# Patient Record
Sex: Male | Born: 1945
Health system: Southern US, Community
[De-identification: ages and names within clinical notes are randomized; demographics above are authoritative.]

## PROBLEM LIST (undated history)

## (undated) DIAGNOSIS — F0390 Unspecified dementia without behavioral disturbance: Secondary | ICD-10-CM

## (undated) DIAGNOSIS — E785 Hyperlipidemia, unspecified: Secondary | ICD-10-CM

## (undated) DIAGNOSIS — K5720 Diverticulitis of large intestine with perforation and abscess without bleeding: Secondary | ICD-10-CM

## (undated) HISTORY — DX: Diverticulitis of large intestine with perforation and abscess without bleeding: K57.20

## (undated) HISTORY — PX: CHOLECYSTECTOMY: SHX55

---

## 2008-07-24 ENCOUNTER — Inpatient Hospital Stay: Payer: Self-pay | Admitting: General Surgery

## 2010-07-10 ENCOUNTER — Emergency Department: Payer: Self-pay | Admitting: Emergency Medicine

## 2010-07-12 LAB — PSA

## 2011-02-01 ENCOUNTER — Ambulatory Visit: Payer: Self-pay | Admitting: Urology

## 2012-02-20 ENCOUNTER — Ambulatory Visit: Payer: Self-pay | Admitting: Urology

## 2013-05-06 ENCOUNTER — Emergency Department: Payer: Self-pay | Admitting: Emergency Medicine

## 2013-05-06 LAB — COMPREHENSIVE METABOLIC PANEL
Albumin: 4.1 g/dL (ref 3.4–5.0)
Alkaline Phosphatase: 38 U/L — ABNORMAL LOW (ref 50–136)
Anion Gap: 4 — ABNORMAL LOW (ref 7–16)
BUN: 13 mg/dL (ref 7–18)
Bilirubin,Total: 0.8 mg/dL (ref 0.2–1.0)
Calcium, Total: 9.6 mg/dL (ref 8.5–10.1)
Chloride: 105 mmol/L (ref 98–107)
Creatinine: 1.65 mg/dL — ABNORMAL HIGH (ref 0.60–1.30)
EGFR (African American): 49 — ABNORMAL LOW
Potassium: 4.3 mmol/L (ref 3.5–5.1)
SGOT(AST): 30 U/L (ref 15–37)
SGPT (ALT): 34 U/L (ref 12–78)
Total Protein: 7.8 g/dL (ref 6.4–8.2)

## 2013-05-06 LAB — CBC
HGB: 16.5 g/dL (ref 13.0–18.0)
MCH: 32.4 pg (ref 26.0–34.0)
MCHC: 35.4 g/dL (ref 32.0–36.0)
MCV: 92 fL (ref 80–100)
Platelet: 129 10*3/uL — ABNORMAL LOW (ref 150–440)
RDW: 13.2 % (ref 11.5–14.5)
WBC: 9.9 10*3/uL (ref 3.8–10.6)

## 2013-05-06 LAB — URINALYSIS, COMPLETE
Bilirubin,UR: NEGATIVE
Protein: NEGATIVE
RBC,UR: 1 /HPF (ref 0–5)
Specific Gravity: 1.017 (ref 1.003–1.030)
Squamous Epithelial: <1
WBC UR: 1 /HPF (ref 0–5)

## 2013-05-06 LAB — TSH: Thyroid Stimulating Horm: 2.24 u[IU]/mL

## 2013-10-19 ENCOUNTER — Ambulatory Visit: Payer: Self-pay | Admitting: Gastroenterology

## 2014-05-10 DIAGNOSIS — E782 Mixed hyperlipidemia: Secondary | ICD-10-CM | POA: Insufficient documentation

## 2015-11-09 DIAGNOSIS — L820 Inflamed seborrheic keratosis: Secondary | ICD-10-CM | POA: Diagnosis not present

## 2015-11-09 DIAGNOSIS — L309 Dermatitis, unspecified: Secondary | ICD-10-CM | POA: Diagnosis not present

## 2015-11-09 DIAGNOSIS — L719 Rosacea, unspecified: Secondary | ICD-10-CM | POA: Diagnosis not present

## 2015-11-09 DIAGNOSIS — L219 Seborrheic dermatitis, unspecified: Secondary | ICD-10-CM | POA: Diagnosis not present

## 2015-12-14 DIAGNOSIS — L820 Inflamed seborrheic keratosis: Secondary | ICD-10-CM | POA: Diagnosis not present

## 2015-12-14 DIAGNOSIS — L719 Rosacea, unspecified: Secondary | ICD-10-CM | POA: Diagnosis not present

## 2015-12-14 DIAGNOSIS — L219 Seborrheic dermatitis, unspecified: Secondary | ICD-10-CM | POA: Diagnosis not present

## 2015-12-14 DIAGNOSIS — L570 Actinic keratosis: Secondary | ICD-10-CM | POA: Diagnosis not present

## 2016-02-22 DIAGNOSIS — Z0000 Encounter for general adult medical examination without abnormal findings: Secondary | ICD-10-CM | POA: Diagnosis not present

## 2016-02-22 DIAGNOSIS — I10 Essential (primary) hypertension: Secondary | ICD-10-CM | POA: Diagnosis not present

## 2016-02-22 DIAGNOSIS — E782 Mixed hyperlipidemia: Secondary | ICD-10-CM | POA: Diagnosis not present

## 2016-02-22 DIAGNOSIS — R001 Bradycardia, unspecified: Secondary | ICD-10-CM | POA: Insufficient documentation

## 2016-02-29 DIAGNOSIS — R001 Bradycardia, unspecified: Secondary | ICD-10-CM | POA: Diagnosis not present

## 2016-06-27 DIAGNOSIS — R001 Bradycardia, unspecified: Secondary | ICD-10-CM | POA: Diagnosis not present

## 2016-06-27 DIAGNOSIS — R002 Palpitations: Secondary | ICD-10-CM | POA: Insufficient documentation

## 2016-06-27 DIAGNOSIS — I10 Essential (primary) hypertension: Secondary | ICD-10-CM | POA: Diagnosis not present

## 2016-07-02 DIAGNOSIS — I493 Ventricular premature depolarization: Secondary | ICD-10-CM | POA: Diagnosis not present

## 2016-07-02 DIAGNOSIS — R002 Palpitations: Secondary | ICD-10-CM | POA: Diagnosis not present

## 2016-07-02 HISTORY — DX: Ventricular premature depolarization: I49.3

## 2016-07-04 DIAGNOSIS — L570 Actinic keratosis: Secondary | ICD-10-CM | POA: Diagnosis not present

## 2016-07-04 DIAGNOSIS — L718 Other rosacea: Secondary | ICD-10-CM | POA: Diagnosis not present

## 2016-07-04 DIAGNOSIS — L578 Other skin changes due to chronic exposure to nonionizing radiation: Secondary | ICD-10-CM | POA: Diagnosis not present

## 2016-07-04 DIAGNOSIS — L821 Other seborrheic keratosis: Secondary | ICD-10-CM | POA: Diagnosis not present

## 2016-07-04 DIAGNOSIS — Z79899 Other long term (current) drug therapy: Secondary | ICD-10-CM | POA: Diagnosis not present

## 2016-07-04 DIAGNOSIS — L219 Seborrheic dermatitis, unspecified: Secondary | ICD-10-CM | POA: Diagnosis not present

## 2016-07-04 DIAGNOSIS — L820 Inflamed seborrheic keratosis: Secondary | ICD-10-CM | POA: Diagnosis not present

## 2016-07-08 DIAGNOSIS — R001 Bradycardia, unspecified: Secondary | ICD-10-CM | POA: Diagnosis not present

## 2016-12-19 DIAGNOSIS — E785 Hyperlipidemia, unspecified: Secondary | ICD-10-CM | POA: Diagnosis not present

## 2016-12-19 DIAGNOSIS — R739 Hyperglycemia, unspecified: Secondary | ICD-10-CM | POA: Diagnosis not present

## 2016-12-19 DIAGNOSIS — R413 Other amnesia: Secondary | ICD-10-CM | POA: Diagnosis not present

## 2016-12-19 DIAGNOSIS — I10 Essential (primary) hypertension: Secondary | ICD-10-CM | POA: Diagnosis not present

## 2016-12-19 DIAGNOSIS — Z1159 Encounter for screening for other viral diseases: Secondary | ICD-10-CM | POA: Diagnosis not present

## 2016-12-19 DIAGNOSIS — Z125 Encounter for screening for malignant neoplasm of prostate: Secondary | ICD-10-CM | POA: Diagnosis not present

## 2017-01-02 DIAGNOSIS — L820 Inflamed seborrheic keratosis: Secondary | ICD-10-CM | POA: Diagnosis not present

## 2017-01-02 DIAGNOSIS — L821 Other seborrheic keratosis: Secondary | ICD-10-CM | POA: Diagnosis not present

## 2017-01-02 DIAGNOSIS — L219 Seborrheic dermatitis, unspecified: Secondary | ICD-10-CM | POA: Diagnosis not present

## 2017-01-02 DIAGNOSIS — L570 Actinic keratosis: Secondary | ICD-10-CM | POA: Diagnosis not present

## 2017-01-02 DIAGNOSIS — L719 Rosacea, unspecified: Secondary | ICD-10-CM | POA: Diagnosis not present

## 2017-01-02 DIAGNOSIS — L578 Other skin changes due to chronic exposure to nonionizing radiation: Secondary | ICD-10-CM | POA: Diagnosis not present

## 2017-01-14 DIAGNOSIS — E782 Mixed hyperlipidemia: Secondary | ICD-10-CM | POA: Diagnosis not present

## 2017-01-14 DIAGNOSIS — I10 Essential (primary) hypertension: Secondary | ICD-10-CM | POA: Diagnosis not present

## 2017-01-14 DIAGNOSIS — R001 Bradycardia, unspecified: Secondary | ICD-10-CM | POA: Diagnosis not present

## 2017-01-14 DIAGNOSIS — R002 Palpitations: Secondary | ICD-10-CM | POA: Diagnosis not present

## 2017-01-14 DIAGNOSIS — I493 Ventricular premature depolarization: Secondary | ICD-10-CM | POA: Diagnosis not present

## 2017-02-04 DIAGNOSIS — R413 Other amnesia: Secondary | ICD-10-CM | POA: Diagnosis not present

## 2017-05-27 DIAGNOSIS — G3184 Mild cognitive impairment, so stated: Secondary | ICD-10-CM | POA: Diagnosis not present

## 2017-07-22 DIAGNOSIS — E782 Mixed hyperlipidemia: Secondary | ICD-10-CM | POA: Diagnosis not present

## 2017-07-22 DIAGNOSIS — R001 Bradycardia, unspecified: Secondary | ICD-10-CM | POA: Diagnosis not present

## 2017-07-22 DIAGNOSIS — I493 Ventricular premature depolarization: Secondary | ICD-10-CM | POA: Diagnosis not present

## 2017-11-25 DIAGNOSIS — F03A0 Unspecified dementia, mild, without behavioral disturbance, psychotic disturbance, mood disturbance, and anxiety: Secondary | ICD-10-CM | POA: Insufficient documentation

## 2017-11-25 DIAGNOSIS — G3184 Mild cognitive impairment, so stated: Secondary | ICD-10-CM | POA: Diagnosis not present

## 2017-11-25 DIAGNOSIS — F0390 Unspecified dementia without behavioral disturbance: Secondary | ICD-10-CM | POA: Insufficient documentation

## 2018-01-08 DIAGNOSIS — L820 Inflamed seborrheic keratosis: Secondary | ICD-10-CM | POA: Diagnosis not present

## 2018-01-08 DIAGNOSIS — L578 Other skin changes due to chronic exposure to nonionizing radiation: Secondary | ICD-10-CM | POA: Diagnosis not present

## 2018-01-08 DIAGNOSIS — L219 Seborrheic dermatitis, unspecified: Secondary | ICD-10-CM | POA: Diagnosis not present

## 2018-01-08 DIAGNOSIS — L719 Rosacea, unspecified: Secondary | ICD-10-CM | POA: Diagnosis not present

## 2018-01-08 DIAGNOSIS — L821 Other seborrheic keratosis: Secondary | ICD-10-CM | POA: Diagnosis not present

## 2018-01-14 DIAGNOSIS — F0390 Unspecified dementia without behavioral disturbance: Secondary | ICD-10-CM | POA: Diagnosis not present

## 2018-01-21 DIAGNOSIS — R001 Bradycardia, unspecified: Secondary | ICD-10-CM | POA: Diagnosis not present

## 2018-01-21 DIAGNOSIS — I493 Ventricular premature depolarization: Secondary | ICD-10-CM | POA: Diagnosis not present

## 2018-01-21 DIAGNOSIS — R413 Other amnesia: Secondary | ICD-10-CM | POA: Diagnosis not present

## 2018-01-21 DIAGNOSIS — E782 Mixed hyperlipidemia: Secondary | ICD-10-CM | POA: Diagnosis not present

## 2018-01-21 DIAGNOSIS — R002 Palpitations: Secondary | ICD-10-CM | POA: Diagnosis not present

## 2018-01-27 DIAGNOSIS — R001 Bradycardia, unspecified: Secondary | ICD-10-CM | POA: Diagnosis not present

## 2018-02-03 DIAGNOSIS — I10 Essential (primary) hypertension: Secondary | ICD-10-CM

## 2018-02-03 DIAGNOSIS — I493 Ventricular premature depolarization: Secondary | ICD-10-CM | POA: Diagnosis not present

## 2018-02-03 DIAGNOSIS — R001 Bradycardia, unspecified: Secondary | ICD-10-CM | POA: Diagnosis not present

## 2018-02-03 HISTORY — DX: Essential (primary) hypertension: I10

## 2018-02-23 DIAGNOSIS — E785 Hyperlipidemia, unspecified: Secondary | ICD-10-CM | POA: Diagnosis not present

## 2018-02-23 DIAGNOSIS — Z125 Encounter for screening for malignant neoplasm of prostate: Secondary | ICD-10-CM | POA: Diagnosis not present

## 2018-02-23 DIAGNOSIS — R1084 Generalized abdominal pain: Secondary | ICD-10-CM | POA: Diagnosis not present

## 2018-02-24 DIAGNOSIS — E785 Hyperlipidemia, unspecified: Secondary | ICD-10-CM | POA: Diagnosis not present

## 2018-02-24 DIAGNOSIS — Z125 Encounter for screening for malignant neoplasm of prostate: Secondary | ICD-10-CM | POA: Diagnosis not present

## 2018-02-24 DIAGNOSIS — R1084 Generalized abdominal pain: Secondary | ICD-10-CM | POA: Diagnosis not present

## 2018-04-03 ENCOUNTER — Emergency Department: Payer: PPO

## 2018-04-03 ENCOUNTER — Inpatient Hospital Stay
Admission: EM | Admit: 2018-04-03 | Discharge: 2018-04-05 | DRG: 392 | Disposition: A | Discharge status: Home / Self Care | Payer: PPO | Attending: General Surgery | Admitting: General Surgery

## 2018-04-03 ENCOUNTER — Other Ambulatory Visit: Payer: Self-pay

## 2018-04-03 DIAGNOSIS — Z23 Encounter for immunization: Secondary | ICD-10-CM

## 2018-04-03 DIAGNOSIS — K5720 Diverticulitis of large intestine with perforation and abscess without bleeding: Principal | ICD-10-CM

## 2018-04-03 DIAGNOSIS — E785 Hyperlipidemia, unspecified: Secondary | ICD-10-CM | POA: Diagnosis present

## 2018-04-03 DIAGNOSIS — R109 Unspecified abdominal pain: Secondary | ICD-10-CM | POA: Diagnosis not present

## 2018-04-03 DIAGNOSIS — R1032 Left lower quadrant pain: Secondary | ICD-10-CM | POA: Diagnosis not present

## 2018-04-03 DIAGNOSIS — Z79899 Other long term (current) drug therapy: Secondary | ICD-10-CM | POA: Diagnosis not present

## 2018-04-03 HISTORY — DX: Diverticulitis of large intestine with perforation and abscess without bleeding: K57.20

## 2018-04-03 LAB — COMPREHENSIVE METABOLIC PANEL
ALK PHOS: 26 U/L — AB (ref 38–126)
ALT: 13 U/L (ref 0–44)
AST: 23 U/L (ref 15–41)
Albumin: 4 g/dL (ref 3.5–5.0)
Anion gap: 10 (ref 5–15)
BUN: 14 mg/dL (ref 8–23)
CALCIUM: 9.4 mg/dL (ref 8.9–10.3)
CO2: 26 mmol/L (ref 22–32)
CREATININE: 1.36 mg/dL — AB (ref 0.61–1.24)
Chloride: 104 mmol/L (ref 98–111)
GFR calc non Af Amer: 51 mL/min — ABNORMAL LOW (ref >60)
GFR, EST AFRICAN AMERICAN: 59 mL/min — AB (ref >60)
Glucose, Bld: 138 mg/dL — ABNORMAL HIGH (ref 70–99)
Potassium: 3.9 mmol/L (ref 3.5–5.1)
SODIUM: 140 mmol/L (ref 135–145)
Total Bilirubin: 0.8 mg/dL (ref 0.3–1.2)
Total Protein: 7.6 g/dL (ref 6.5–8.1)

## 2018-04-03 LAB — CBC
HCT: 45.2 % (ref 40.0–52.0)
Hemoglobin: 15.9 g/dL (ref 13.0–18.0)
MCH: 31.4 pg (ref 26.0–34.0)
MCHC: 35.1 g/dL (ref 32.0–36.0)
MCV: 89.6 fL (ref 80.0–100.0)
PLATELETS: 174 10*3/uL (ref 150–440)
RBC: 5.05 MIL/uL (ref 4.40–5.90)
RDW: 13.4 % (ref 11.5–14.5)
WBC: 5.8 10*3/uL (ref 3.8–10.6)

## 2018-04-03 LAB — URINALYSIS, COMPLETE (UACMP) WITH MICROSCOPIC
Bacteria, UA: NONE SEEN
Bilirubin Urine: NEGATIVE
Glucose, UA: NEGATIVE mg/dL
Hgb urine dipstick: NEGATIVE
Ketones, ur: NEGATIVE mg/dL
Leukocytes, UA: NEGATIVE
Nitrite: NEGATIVE
PH: 5 (ref 5.0–8.0)
Protein, ur: NEGATIVE mg/dL
SPECIFIC GRAVITY, URINE: 1.021 (ref 1.005–1.030)
SQUAMOUS EPITHELIAL / LPF: NONE SEEN (ref 0–5)

## 2018-04-03 LAB — LIPASE, BLOOD: Lipase: 40 U/L (ref 11–51)

## 2018-04-03 MED ORDER — ENOXAPARIN SODIUM 40 MG/0.4ML ~~LOC~~ SOLN
40.0000 mg | SUBCUTANEOUS | Status: DC
  Administered 2018-04-03 – 2018-04-04 (×2): 40 mg via SUBCUTANEOUS
  Filled 2018-04-03 (×2): qty 0.4

## 2018-04-03 MED ORDER — IOHEXOL 350 MG/ML SOLN
75.0000 mL | Freq: Once | INTRAVENOUS | Status: AC | PRN
  Administered 2018-04-03: 75 mL via INTRAVENOUS

## 2018-04-03 MED ORDER — KETOCONAZOLE 2 % EX CREA
1.0000 "application " | TOPICAL_CREAM | CUTANEOUS | Status: DC
  Filled 2018-04-03: qty 15

## 2018-04-03 MED ORDER — ACETAMINOPHEN 500 MG PO TABS
1000.0000 mg | ORAL_TABLET | Freq: Four times a day (QID) | ORAL | Status: DC
Start: 2018-04-03 — End: 2018-04-05
  Administered 2018-04-03 – 2018-04-04 (×5): 1000 mg via ORAL
  Filled 2018-04-03 (×5): qty 2

## 2018-04-03 MED ORDER — METRONIDAZOLE IN NACL 5-0.79 MG/ML-% IV SOLN
500.0000 mg | Freq: Three times a day (TID) | INTRAVENOUS | Status: DC
  Administered 2018-04-03 – 2018-04-05 (×6): 500 mg via INTRAVENOUS
  Filled 2018-04-03 (×7): qty 100

## 2018-04-03 MED ORDER — ONDANSETRON HCL 4 MG/2ML IJ SOLN: 4.0000 mg | Freq: Four times a day (QID) | INTRAMUSCULAR | Status: DC | PRN

## 2018-04-03 MED ORDER — DONEPEZIL HCL 5 MG PO TABS
15.0000 mg | ORAL_TABLET | Freq: Every day | ORAL | Status: DC
  Administered 2018-04-03: 15 mg via ORAL
  Filled 2018-04-03 (×3): qty 3

## 2018-04-03 MED ORDER — PNEUMOCOCCAL VAC POLYVALENT 25 MCG/0.5ML IJ INJ
0.5000 mL | INJECTION | INTRAMUSCULAR | Status: AC
Start: 2018-04-04 — End: 2018-04-05
  Administered 2018-04-05: 0.5 mL via INTRAMUSCULAR
  Filled 2018-04-03: qty 0.5

## 2018-04-03 MED ORDER — KCL IN DEXTROSE-NACL 20-5-0.45 MEQ/L-%-% IV SOLN
INTRAVENOUS | Status: DC
  Administered 2018-04-03 – 2018-04-05 (×3): via INTRAVENOUS
  Filled 2018-04-03 (×6): qty 1000

## 2018-04-03 MED ORDER — ONDANSETRON 4 MG PO TBDP: 4.0000 mg | ORAL_TABLET | Freq: Four times a day (QID) | ORAL | Status: DC | PRN

## 2018-04-03 MED ORDER — KETOROLAC TROMETHAMINE 15 MG/ML IJ SOLN
15.0000 mg | Freq: Four times a day (QID) | INTRAMUSCULAR | Status: DC
  Administered 2018-04-03 – 2018-04-05 (×7): 15 mg via INTRAVENOUS
  Filled 2018-04-03 (×8): qty 1

## 2018-04-03 MED ORDER — SODIUM CHLORIDE 0.9 % IV SOLN
2.0000 g | INTRAVENOUS | Status: DC
  Administered 2018-04-03 – 2018-04-04 (×2): 2 g via INTRAVENOUS
  Filled 2018-04-03 (×2): qty 2
  Filled 2018-04-03: qty 20

## 2018-04-03 MED ORDER — OXYCODONE HCL 5 MG PO TABS: 5.0000 mg | ORAL_TABLET | ORAL | Status: DC | PRN

## 2018-04-03 NOTE — ED Triage Notes (Signed)
Pt c/o intermittent stabbing lower abd pain for the past 3 months, states he has been having loose mucousy stools. States he was seen by PCP 3 weeks ago with same sx..denies vomiting or fever.

## 2018-04-03 NOTE — H&P (Signed)
SURGICAL HISTORY & PHYSICAL (cpt [phone])  HISTORY OF PRESENT ILLNESS (HPI):  72 y.o. very pleasant male presented to [facility] ED today for abdominal pain. Patient reports he yesterday first experienced cramping lower abdominal pain, which then progressed to become worse and awoke him from sleep ~4 am this morning and for which he presented to [facility] ED today. Patient describes he frequently passes small, hard BM's with loose mucus-like BM's and occassionally takes milk of magnesia as needed, but he denies any routine bowel regimen. He otherwise denies N/V, fever/chills, CP, or SOB and says he experienced similar ~7 years ago, at which time he describes he was admitted for antibiotics and discharged with outpatient oral antibiotics and subsequent colonoscopy. He says he is usually fairly active, though he has recently struggled with impaired memory, for which he's been seeing a neurologist.  [place] ([place]):  History reviewed. No pertinent past medical history.  Reviewed. Otherwise negative.   PAST SURGICAL HISTORY ([place]):  Past Surgical History:  Procedure Laterality Date  . CHOLECYSTECTOMY      Reviewed. Otherwise negative.   MEDICATIONS:  Prior to Admission medications   Medication Sig Start Date End Date Taking? Authorizing Provider  donepezil (ARICEPT) 10 MG tablet Take 15 mg by mouth at bedtime.   Yes [provider]  doxycycline (VIBRAMYCIN) 50 MG capsule Take 1 capsule by mouth daily. 03/19/18  Yes [provider]  fenofibrate (TRICOR) 145 MG tablet Take 1 tablet by mouth daily. 03/12/18  Yes [provider]  ketoconazole (NIZORAL) 2 % cream Apply 1 application topically 3 (three) times a week. 01/08/18  Yes [provider]  Saw Palmetto 160 MG CAPS Take 1 capsule by mouth 2 (two) times daily.   Yes [provider]     ALLERGIES:  No Known Allergies   SOCIAL HISTORY:  Social History   Socioeconomic History  . Marital status:  Divorced    Spouse name: Not on file  . Number of children: Not on file  . Years of education: Not on file  . Highest education level: Not on file  Occupational History  . Not on file  Social Needs  . Financial resource strain: Not on file  . Food insecurity:    Worry: Not on file    Inability: Not on file  . Transportation needs:    Medical: Not on file    Non-medical: Not on file  Tobacco Use  . Smoking status: Never Smoker  . Smokeless tobacco: Never Used  Substance and Sexual Activity  . Alcohol use: Not on file  . Drug use: Not Currently  . Sexual activity: Not on file  Lifestyle  . Physical activity:    Days per week: Not on file    Minutes per session: Not on file  . Stress: Not on file  Relationships  . Social connections:    Talks on phone: Not on file    Gets together: Not on file    Attends religious service: Not on file    Active member of club or organization: Not on file    Attends meetings of clubs or organizations: Not on file    Relationship status: Not on file  . Intimate partner violence:    Fear of current or ex partner: Not on file    Emotionally abused: Not on file    Physically abused: Not on file    Forced sexual activity: Not on file  Other Topics Concern  . Not on file  Social History Narrative  . Not on file    The patient currently resides (home / rehab facility / nursing home): Home The patient normally is (ambulatory / bedbound): Ambulatory  FAMILY HISTORY:  No family history on file.  Otherwise negative.   REVIEW OF SYSTEMS:  Constitutional: denies any other weight loss, fever, chills, or sweats  Eyes: denies any other vision changes, history of eye injury  ENT: denies sore throat, hearing problems  Respiratory: denies shortness of breath, wheezing  Cardiovascular: denies chest pain, palpitations  Gastrointestinal: abdominal pain, N/V, and bowel function as per HPI  Genitourinary: denies burning with urination or urinary  frequency Musculoskeletal: denies any other joint pains or cramps  Skin: Denies any other rashes or skin discolorations  Neurological: denies any other headache, dizziness, weakness  Psychiatric: denies any other depression, anxiety   All other review of systems were otherwise negative.  VITAL SIGNS:  Temp:  [98.6 F (37 C)] 98.6 F (37 C) (06/28 1039) Pulse Rate:  [50] 50 (06/28 1039) Resp:  [16] 16 (06/28 1039) BP: (138)/(110) 138/110 (06/28 1039) SpO2:  [100 %] 100 % (06/28 1039) Weight:  [205 lb (93 kg)] 205 lb (93 kg) (06/28 1040)     Height: 5\' 10"  (177.8 cm) Weight: 205 lb (93 kg) BMI (Calculated): 29.41   INTAKE/OUTPUT:  This shift: No intake/output data recorded.  Last 2 shifts: @IOLAST2SHIFTS @  PHYSICAL EXAM:  Constitutional:  -- Normal body habitus  -- Awake, alert, and oriented x3, no apparent distress Eyes:  -- Pupils equally round and reactive to light  -- No scleral icterus, B/L no occular discharge Ear, nose, throat: -- Neck is FROM WNL -- No jugular venous distension  Pulmonary:  -- No wheezes or rhales -- Equal breath sounds bilaterally -- Breathing non-labored at rest Cardiovascular:  -- S1, S2 present  -- No pericardial rubs  Gastrointestinal:  -- Abdomen soft and non-distended with moderate LLQ > suprapubic tenderness to palpation, no guarding or rebound tenderness -- No abdominal masses appreciated, pulsatile or otherwise  Musculoskeletal and Integumentary:  -- Wounds or skin discoloration: None appreciated -- Extremities: B/L UE and LE FROM, hands and feet warm, no edema  Neurologic:  -- Motor function: Intact and symmetric -- Sensation: Intact and symmetric Psychiatric:  -- Mood and affect WNL  Labs:  CBC Latest Ref Rng & Units 04/03/2018 05/06/2013  WBC 3.8 - 10.6 K/uL 5.8 9.9  Hemoglobin 13.0 - 18.0 g/dL 15.9 16.5  Hematocrit 40.0 - 52.0 % 45.2 46.7  Platelets 150 - 440 K/uL 174 129(L)   CMP Latest Ref Rng & Units 04/03/2018 05/06/2013   Glucose 70 - 99 mg/dL 138(H) 104(H)  BUN 8 - 23 mg/dL 14 13  Creatinine 0.61 - 1.24 mg/dL 1.36(H) 1.65(H)  Sodium 135 - 145 mmol/L 140 137  Potassium 3.5 - 5.1 mmol/L 3.9 4.3  Chloride 98 - 111 mmol/L 104 105  CO2 22 - 32 mmol/L 26 28  Calcium 8.9 - 10.3 mg/dL 9.4 9.6  Total Protein 6.5 - 8.1 g/dL 7.6 7.8  Total Bilirubin 0.3 - 1.2 mg/dL 0.8 0.8  Alkaline Phos 38 - 126 U/L 26(L) 38(L)  AST 15 - 41 U/L 23 30  ALT 0 - 44 U/L 13 34   Imaging studies:  CT Abdomen and Pelvis with Contrast (04/03/2018) - personally reviewed and discussed with patient and ED physician 1. Acute sigmoid diverticulitis complicated by a small (1.9 x 1.6 cm) intramural abscess. Given the small size of the abscess and  the location within the colonic wall, this fluid collection is not amenable to percutaneous drainage at this time. 2. Trace free fluid in the pelvis is likely reactive. 3. Borderline cardiomegaly. 4. Bilateral renal cysts. 5. Omental fat containing right inguinal hernia. 6.  Aortic Atherosclerosis  Assessment/Plan: (ICD-10's: K67.20) 72 y.o. male with recurrent sigmoid colonic diverticulitis with a small intramural sigmoid colonic abscess, complicated by pertinent comorbidities including HLD and memory impairment.               - NPO for now, IV fluids             - pain control prn, minimize narcotics              - IV antibiotics (ceftriaxone and metronidazole)             - will admit to surgical service and continue to monitor abdominal exam and bowel function             - when tolerating PO, will need to maintain hydration + initially low fiber x 6 weeks, then high fiber diet             - surgery including partial colectomy and likely colostomy discussed if doesn't improve/resolve with antibiotics             - elective outpatient elective partial sigmoid colectomy was also discussed             - DVT prophylaxis, ambulation encouraged  All of the above findings and recommendations were  discussed with the patient, and all of his questions were answered to his expressed satisfaction.  -- Marilynne Drivers Rosana Hoes, MD, [place]: [facility] - Partnering for exceptional care. Office: [phone]

## 2018-04-03 NOTE — ED Notes (Signed)
Called CT and informed them that pt now has IV and is ready for test

## 2018-04-03 NOTE — ED Notes (Signed)
Attempted to call report per secretary RN in isolation room at this time giving meds and will call back.

## 2018-04-03 NOTE — ED Provider Notes (Signed)
[facility] Emergency Department Provider Note    First MD Initiated Contact with Patient 04/03/18 1134     (approximate)  I have reviewed the triage vital signs and the nursing notes.   HISTORY  Chief Complaint Abdominal Pain    HPI Seth Thompson is a 72 y.o. male with a history of diverticulitis presents to the ER with chief complaint of severe left lower quadrant abdominal pain that started described as feels like someone stabbing him with a machete last night.  States he is also been having some very thin small formed stools and also has been very gassy.  Denies any measured fevers or chills.  Denies any history of constipation.  States it feels similar to previous episodes of diverticulitis.  Has not seen GI doctor in over 6 years.  He does not smoke.  Does not drink alcohol.  No flank pain.  No testicular pain.  No dysuria.    History reviewed. No pertinent past medical history. No family history on file. Past Surgical History:  Procedure Laterality Date  . CHOLECYSTECTOMY     There are no active problems to display for this patient.     Prior to Admission medications   Medication Sig Start Date End Date Taking? Authorizing Provider  donepezil (ARICEPT) 10 MG tablet Take 15 mg by mouth at bedtime.   Yes [provider]  doxycycline (VIBRAMYCIN) 50 MG capsule Take 1 capsule by mouth daily. 03/19/18  Yes [provider]  fenofibrate (TRICOR) 145 MG tablet Take 1 tablet by mouth daily. 03/12/18  Yes [provider]  ketoconazole (NIZORAL) 2 % cream Apply 1 application topically 3 (three) times a week. 01/08/18  Yes [provider]  Saw Palmetto 160 MG CAPS Take 1 capsule by mouth 2 (two) times daily.   Yes [provider]    Allergies Patient has no known allergies.    Social History Social History   Tobacco Use  . Smoking status: Never Smoker  . Smokeless tobacco: Never Used  Substance Use  Topics  . Alcohol use: Not on file  . Drug use: Not Currently    Review of Systems Patient denies headaches, rhinorrhea, blurry vision, numbness, shortness of breath, chest pain, edema, cough, abdominal pain, nausea, vomiting, diarrhea, dysuria, fevers, rashes or hallucinations unless otherwise stated above in HPI. ____________________________________________   PHYSICAL EXAM:  VITAL SIGNS: Vitals:   04/03/18 1039  BP: (!) 138/110  Pulse: (!) 50  Resp: 16  Temp: 98.6 F (37 C)  SpO2: 100%    Constitutional: Alert and oriented.  Eyes: Conjunctivae are normal.  Head: Atraumatic. Nose: No congestion/rhinnorhea. Mouth/Throat: Mucous membranes are moist.   Neck: No stridor. Painless ROM.  Cardiovascular: Normal rate, regular rhythm. Grossly normal heart sounds.  Good peripheral circulation. Respiratory: Normal respiratory effort.  No retractions. Lungs CTAB. Gastrointestinal: Soft with ttp of llq. No distention. No abdominal bruits. No CVA tenderness. Genitourinary: deferred Musculoskeletal: No lower extremity tenderness nor edema.  No joint effusions. Neurologic:  Normal speech and language. No gross focal neurologic deficits are appreciated. No facial droop Skin:  Skin is warm, dry and intact. No rash noted. Psychiatric: Mood and affect are normal. Speech and behavior are normal.  ____________________________________________   LABS (all labs ordered are listed, but only abnormal results are displayed)  Results for orders placed or performed during the hospital encounter of 04/03/18 (from the past 24 hour(s))  Lipase, blood     Status: None  Collection Time: 04/03/18 10:47 AM  Result Value Ref Range   Lipase 40 11 - 51 U/L  Comprehensive metabolic panel     Status: Abnormal   Collection Time: 04/03/18 10:47 AM  Result Value Ref Range   Sodium 140 135 - 145 mmol/L   Potassium 3.9 3.5 - 5.1 mmol/L   Chloride 104 98 - 111 mmol/L   CO2 26 22 - 32 mmol/L   Glucose, Bld  138 (H) 70 - 99 mg/dL   BUN 14 8 - 23 mg/dL   Creatinine, Ser 1.36 (H) 0.61 - 1.24 mg/dL   Calcium 9.4 8.9 - 10.3 mg/dL   Total Protein 7.6 6.5 - 8.1 g/dL   Albumin 4.0 3.5 - 5.0 g/dL   AST 23 15 - 41 U/L   ALT 13 0 - 44 U/L   Alkaline Phosphatase 26 (L) 38 - 126 U/L   Total Bilirubin 0.8 0.3 - 1.2 mg/dL   GFR calc non Af Amer 51 (L) >60 mL/min   GFR calc Af Amer 59 (L) >60 mL/min   Anion gap 10 5 - 15  CBC     Status: None   Collection Time: 04/03/18 10:47 AM  Result Value Ref Range   WBC 5.8 3.8 - 10.6 K/uL   RBC 5.05 4.40 - 5.90 MIL/uL   Hemoglobin 15.9 13.0 - 18.0 g/dL   HCT 45.2 40.0 - 52.0 %   MCV 89.6 80.0 - 100.0 fL   MCH 31.4 26.0 - 34.0 pg   MCHC 35.1 32.0 - 36.0 g/dL   RDW 13.4 11.5 - 14.5 %   Platelets 174 150 - 440 K/uL  Urinalysis, Complete w Microscopic     Status: Abnormal   Collection Time: 04/03/18 10:47 AM  Result Value Ref Range   Color, Urine YELLOW (A) YELLOW   APPearance CLEAR (A) CLEAR   Specific Gravity, Urine 1.021 1.005 - 1.030   pH 5.0 5.0 - 8.0   Glucose, UA NEGATIVE NEGATIVE mg/dL   Hgb urine dipstick NEGATIVE NEGATIVE   Bilirubin Urine NEGATIVE NEGATIVE   Ketones, ur NEGATIVE NEGATIVE mg/dL   Protein, ur NEGATIVE NEGATIVE mg/dL   Nitrite NEGATIVE NEGATIVE   Leukocytes, UA NEGATIVE NEGATIVE   RBC / HPF 0-5 0 - 5 RBC/hpf   WBC, UA 0-5 0 - 5 WBC/hpf   Bacteria, UA NONE SEEN NONE SEEN   Squamous Epithelial / LPF NONE SEEN 0 - 5   Mucus PRESENT    Ca Oxalate Crys, UA PRESENT    ____________________________________________ ____________________________________________  RADIOLOGY  I personally reviewed all radiographic images ordered to evaluate for the above acute complaints and reviewed radiology reports and findings.  These findings were personally discussed with the patient.  Please see medical record for radiology report.  ____________________________________________   PROCEDURES  Procedure(s) performed:   Procedures    Critical Care performed: no ____________________________________________   INITIAL IMPRESSION / ASSESSMENT AND PLAN / ED COURSE  Pertinent labs & imaging results that were available during my care of the patient were reviewed by me and considered in my medical decision making (see chart for details).   DDX: diverticulitis, mass, sbo, hernia, colitis, constipation, obstripation, impaction, uti, stone, AAA  Seth Thompson is a 72 y.o. who presents to the ED with left lower quadrant abdominal pain as described above.  Patient with history of diverticulitis and given his tenderness on exam will order CT imaging to further evaluate.  Blood work is reassuring shows no significant leukocytosis and is  afebrile.  Other differentials also included a mass.  CT imaging does show evidence of intramural abscess.  I did discuss case with Dr. Rosana Hoes of general surgery who kindly agrees to admit patient for IV antibiotics.  He will be ordering antibiotics per discussion.  Patient remains hemodynamically stable and appropriate for admission to the hospital.  Clinical Course as of Apr 03 1329  Fri Apr 03, 2018  1321 RDW: 13.4 [PR]    Clinical Course User Index [PR] Merlyn Lot, MD     As part of my medical decision making, I reviewed the following data within the [facility] notes reviewed and incorporated, Labs reviewed, notes from prior ED visits.   ____________________________________________   FINAL CLINICAL IMPRESSION(S) / ED DIAGNOSES  Final diagnoses:  Diverticulitis of large intestine with abscess without bleeding      NEW MEDICATIONS STARTED DURING THIS VISIT:  New Prescriptions   No medications on file     Note:  This document was prepared using Dragon voice recognition software and may include unintentional dictation errors.    Merlyn Lot, MD 04/03/18 1331

## 2018-04-03 NOTE — ED Notes (Signed)
First Nurse Note:  Patient states he has had abdominal pain X 2 mos. Here this morning "it feels like somebody is stabbing me in the abdomen".  Also complaining of "a lot of gas".  Placed in [place].

## 2018-04-04 LAB — CBC
HCT: 44.2 % (ref 40.0–52.0)
HEMOGLOBIN: 15.3 g/dL (ref 13.0–18.0)
MCH: 31.2 pg (ref 26.0–34.0)
MCHC: 34.7 g/dL (ref 32.0–36.0)
MCV: 90 fL (ref 80.0–100.0)
PLATELETS: 155 10*3/uL (ref 150–440)
RBC: 4.91 MIL/uL (ref 4.40–5.90)
RDW: 13.2 % (ref 11.5–14.5)
WBC: 5.1 10*3/uL (ref 3.8–10.6)

## 2018-04-04 LAB — BASIC METABOLIC PANEL
ANION GAP: 9 (ref 5–15)
BUN: 18 mg/dL (ref 8–23)
CO2: 25 mmol/L (ref 22–32)
Calcium: 8.8 mg/dL — ABNORMAL LOW (ref 8.9–10.3)
Chloride: 104 mmol/L (ref 98–111)
Creatinine, Ser: 1.22 mg/dL (ref 0.61–1.24)
GFR, EST NON AFRICAN AMERICAN: 58 mL/min — AB (ref >60)
Glucose, Bld: 136 mg/dL — ABNORMAL HIGH (ref 70–99)
POTASSIUM: 3.9 mmol/L (ref 3.5–5.1)
SODIUM: 138 mmol/L (ref 135–145)

## 2018-04-04 MED ORDER — FENOFIBRATE 54 MG PO TABS
108.0000 mg | ORAL_TABLET | Freq: Every day | ORAL | Status: DC
  Administered 2018-04-04 – 2018-04-05 (×2): 108 mg via ORAL
  Filled 2018-04-04 (×2): qty 2

## 2018-04-04 MED ORDER — AMOXICILLIN-POT CLAVULANATE 875-125 MG PO TABS: 1.0000 | ORAL_TABLET | Freq: Two times a day (BID) | ORAL | 0 refills | Status: DC

## 2018-04-04 MED ORDER — BOOST / RESOURCE BREEZE PO LIQD CUSTOM
1.0000 | Freq: Three times a day (TID) | ORAL | Status: DC
  Administered 2018-04-04 (×2): 1 via ORAL

## 2018-04-04 NOTE — Progress Notes (Signed)
Patient was very eager this am to be discharged today.  He is worried about his daughter at home who is special needs.  I encouraged him to try to find help to watch her and looked up his church phone number to maybe find someone to help.  He later told me he would stay and that the agency who watches his daughter would be staying with her.  He did have some church members come to see him.  He is content with staying the night

## 2018-04-04 NOTE — Progress Notes (Signed)
SURGICAL PROGRESS NOTE (cpt [phone])  Hospital Day(s): 1.   Post op day(s):  Marland Kitchen   Interval History: Patient seen and examined, no acute events or new complaints overnight. Patient reports nearly complete resolution of LLQ > suprapubic abdominal pain, denies any N/V, fever/chills, CP, or SOB. He has been ambulating and says tolerating clear liquids diet without difficulty.  Review of Systems:  Constitutional: denies fever, chills  HEENT: denies cough or congestion  Respiratory: denies any shortness of breath  Cardiovascular: denies chest pain or palpitations  Gastrointestinal: abdominal pain, N/V, and bowel function as per interval history Genitourinary: denies burning with urination or urinary frequency Musculoskeletal: denies pain, decreased motor or sensation Integumentary: denies any other rashes or skin discolorations Neurological: denies HA or vision/hearing changes   Vital signs in last 24 hours: [min-max] current  Temp:  [97.5 F (36.4 C)-98.6 F (37 C)] 97.5 F (36.4 C) (06/29 0440) Pulse Rate:  [42-57] 50 (06/29 0440) Resp:  [16-18] 16 (06/29 0440) BP: (134-159)/(74-110) 134/74 (06/29 0440) SpO2:  [92 %-100 %] 97 % (06/29 0440) Weight:  [205 lb (93 kg)] 205 lb (93 kg) (06/28 1040)     Height: 5\' 10"  (177.8 cm) Weight: 205 lb (93 kg) BMI (Calculated): 29.41   Intake/Output this shift:  No intake/output data recorded.   Intake/Output last 2 shifts:  @IOLAST2SHIFTS @   Physical Exam:  Constitutional: alert, cooperative and no distress  HENT: normocephalic without obvious abnormality  Eyes: PERRL, EOM's grossly intact and symmetric  Neuro: CN II - XII grossly intact and symmetric without deficit  Respiratory: breathing non-labored at rest  Cardiovascular: regular rate and sinus rhythm  Gastrointestinal: soft and non-distended with minimal LLQ > suprapubic tenderness to even deep palpation Musculoskeletal: UE and LE FROM, no edema or wounds, motor and sensation grossly  intact, NT   Labs:  CBC Latest Ref Rng & Units 04/03/2018 05/06/2013  WBC 3.8 - 10.6 K/uL 5.8 9.9  Hemoglobin 13.0 - 18.0 g/dL 15.9 16.5  Hematocrit 40.0 - 52.0 % 45.2 46.7  Platelets 150 - 440 K/uL 174 129(L)   CMP Latest Ref Rng & Units 04/03/2018 05/06/2013  Glucose 70 - 99 mg/dL 138(H) 104(H)  BUN 8 - 23 mg/dL 14 13  Creatinine 0.61 - 1.24 mg/dL 1.36(H) 1.65(H)  Sodium 135 - 145 mmol/L 140 137  Potassium 3.5 - 5.1 mmol/L 3.9 4.3  Chloride 98 - 111 mmol/L 104 105  CO2 22 - 32 mmol/L 26 28  Calcium 8.9 - 10.3 mg/dL 9.4 9.6  Total Protein 6.5 - 8.1 g/dL 7.6 7.8  Total Bilirubin 0.3 - 1.2 mg/dL 0.8 0.8  Alkaline Phos 38 - 126 U/L 26(L) 38(L)  AST 15 - 41 U/L 23 30  ALT 0 - 44 U/L 13 34   Imaging studies: No new pertinent imaging studies   Assessment/Plan: (ICD-10's: K66.20) 72 y.o. male with recurrent sigmoid colonic diverticulitis with a small intramural sigmoid colonic abscess, complicated by pertinent comorbidities including HLD and memory impairment.   - started clear liquids diet - pain control prn, minimize narcotics  - IV antibiotics (ceftriaxone and metronidazole) - in response to patient wanting to be discharged to provide care for his handicapped daughter s/p TBI, discussed advancement of diet and anticipated discharge tomorrow, but patient adamantly requests to be discharged today... agreed to reassess for advancement to soft diet at lunch and may reluctantly consider discharge with oral antibiotics if continuing to do well... prescription for oral antibiotics provided to facilitate patient being able to get  antibiotic prescription filled without missing any antibiotics when discharged - when tolerating PO, will need tomaintain hydration + initially low fiber x 6 weeks, then high fiber diet - elective outpatient elective partial sigmoid colectomy was also discussed - DVT prophylaxis,  ambulation encouraged  All of the above findings and recommendations were discussed with the patient and his RN, and all of his questions were answered to his expressed satisfaction.  -- Marilynne Drivers Rosana Hoes, MD, [place]: [facility] - Partnering for exceptional care. Office: [phone]

## 2018-04-04 NOTE — Progress Notes (Signed)
Heart rate 40.  Dr Rosana Hoes notified.  No new orders at this time

## 2018-04-05 DIAGNOSIS — K5720 Diverticulitis of large intestine with perforation and abscess without bleeding: Secondary | ICD-10-CM

## 2018-04-05 LAB — BASIC METABOLIC PANEL
ANION GAP: 8 (ref 5–15)
BUN: 13 mg/dL (ref 8–23)
CO2: 27 mmol/L (ref 22–32)
Calcium: 8.9 mg/dL (ref 8.9–10.3)
Chloride: 105 mmol/L (ref 98–111)
Creatinine, Ser: 1.18 mg/dL (ref 0.61–1.24)
Glucose, Bld: 141 mg/dL — ABNORMAL HIGH (ref 70–99)
POTASSIUM: 3.4 mmol/L — AB (ref 3.5–5.1)
SODIUM: 140 mmol/L (ref 135–145)

## 2018-04-05 NOTE — Progress Notes (Signed)
Patient discharged with sister. He took all belongings with him home. Discharge paperwork and instructions reviewed. All questions answered and education provided.

## 2018-04-05 NOTE — Discharge Instructions (Signed)
°  Diet: Resume soft low fiber diet for 6 weeks. .   Activity: Increase activity slowly as tolerated. Do not drive or drink alcohol if taking narcotic pain medications.  Medications: Resume all home medications. For mild to moderate pain: acetaminophen (Tylenol) or ibuprofen (if no kidney disease). Take antibiotic therapy as prescribed. Complete the course even when you feel good.  Marland Kitchen

## 2018-04-05 NOTE — Discharge Summary (Signed)
Physician Discharge Summary  Patient ID: Seth Thompson MRN: [id number] DOB/AGE: July 09, 1946 72 y.o.  Admit date: 04/03/2018 Discharge date: 04/05/2018  Admission Diagnoses: Diverticulitis of large intestine with abscess without bleeding  Discharge Diagnoses:  Active Problems:   Diverticulitis of large intestine with abscess without bleeding   Discharged Condition: good  Hospital Course: Patient admitted with the above diagnosis. Started on IV antibiotic therapy and bowel rest. Pain resolved. Diet was progressed until patient tolerated soft diet. No pain. Patient passing bowel movement. No fever and ambulating without difficulty.   Consults: None  Significant Diagnostic Studies: radiology: CT scan: Acute diverticulitis with intramural abscess. No free air or free fluid.   Treatments: IV hydration and antibiotics: ceftriaxone and metronidazole  Discharge Exam: Blood pressure 135/69, pulse 88, temperature 97.6 F (36.4 C), temperature source Oral, resp. rate 20, height 5\' 10"  (1.778 m), weight 93 kg (205 lb), SpO2 97 %. General appearance: alert and cooperative Resp: clear to auscultation bilaterally Cardio: regular rate and rhythm, S1, S2 normal, no murmur, click, rub or gallop GI: soft, non-tender; bowel sounds normal; no masses,  no organomegaly  Disposition: Discharge disposition: 01-Home or Self Care       Discharge Instructions    Increase activity slowly   Complete by:  As directed      Allergies as of 04/05/2018   No Known Allergies     Medication List    TAKE these medications   amoxicillin-clavulanate 875-125 MG tablet Commonly known as:  AUGMENTIN Take 1 tablet by mouth 2 (two) times daily for 14 days.   donepezil 10 MG tablet Commonly known as:  ARICEPT Take 15 mg by mouth at bedtime.   doxycycline 50 MG capsule Commonly known as:  VIBRAMYCIN Take 1 capsule by mouth daily.   fenofibrate 145 MG tablet Commonly known as:  TRICOR Take 1 tablet  by mouth daily.   ketoconazole 2 % cream Commonly known as:  NIZORAL Apply 1 application topically 3 (three) times a week.   Saw Palmetto 160 MG Caps Take 1 capsule by mouth 2 (two) times daily.      Follow-up Information    Vickie Epley, MD. Schedule an appointment as soon as possible for a visit in 1 week(s).   Specialty:  General Surgery Contact information: [place] [address] [phone]           Signed: Herbert Pun 04/05/2018, 7:56 AM

## 2018-04-07 ENCOUNTER — Telehealth: Payer: Self-pay

## 2018-04-07 NOTE — Telephone Encounter (Signed)
Patient is calling to ask if he can still take his Omeprazole and to change his pharmacy to [address]. Continue with Omeprazole. Follow up with Dr.Davis 04/22/18.

## 2018-04-10 NOTE — Telephone Encounter (Signed)
Patient has called and left a message on the voicemail on 04/10/18 when office was closed for the [place].   He states that Dr Rosana Hoes requested for him to take Augmentin, however it was not sent. This was prescribed on 04/04/18. Patient sounds as if he is upset. He stated to send this to [facility] on [address] and not to send it to [facility].   When I look at the medication detail, I do see that it has been sent to the correct pharmacy and that it was signed. This was prescribed when patient was seen in the hospital.

## 2018-04-13 ENCOUNTER — Other Ambulatory Visit: Payer: Self-pay

## 2018-04-13 MED ORDER — AMOXICILLIN-POT CLAVULANATE 875-125 MG PO TABS: 1.0000 | ORAL_TABLET | Freq: Two times a day (BID) | ORAL | 0 refills | Status: AC

## 2018-04-13 NOTE — Telephone Encounter (Signed)
Left message stating his prescription has been resent to the pharmacy and he can pick it up.

## 2018-04-16 DIAGNOSIS — K5792 Diverticulitis of intestine, part unspecified, without perforation or abscess without bleeding: Secondary | ICD-10-CM | POA: Diagnosis not present

## 2018-04-16 DIAGNOSIS — R0981 Nasal congestion: Secondary | ICD-10-CM | POA: Diagnosis not present

## 2018-04-17 ENCOUNTER — Other Ambulatory Visit: Payer: Self-pay

## 2018-04-21 ENCOUNTER — Encounter: Payer: Self-pay | Admitting: Surgery

## 2018-04-21 ENCOUNTER — Ambulatory Visit (INDEPENDENT_AMBULATORY_CARE_PROVIDER_SITE_OTHER): Payer: PPO | Admitting: Surgery

## 2018-04-21 VITALS — BP 130/78 | HR 45 | Temp 97.4°F | Ht 70.0 in | Wt 192.0 lb

## 2018-04-21 DIAGNOSIS — K5720 Diverticulitis of large intestine with perforation and abscess without bleeding: Secondary | ICD-10-CM | POA: Diagnosis not present

## 2018-04-21 NOTE — Patient Instructions (Signed)
Please finish the course of Antibiotics.  Please call our office if you have questions or concerns.  Please take Colace daily to avoid constipation.     Diverticulitis Diverticulitis is when small pockets in your large intestine (colon) get infected or swollen. This causes stomach pain and watery poop (diarrhea). These pouches are called diverticula. They form in people who have a condition called diverticulosis. Follow these instructions at home: Medicines  Take over-the-counter and prescription medicines only as told by your doctor. These include: ? Antibiotics. ? Pain medicines. ? Fiber pills. ? Probiotics. ? Stool softeners.  Do not drive or use heavy machinery while taking prescription pain medicine.  If you were prescribed an antibiotic, take it as told. Do not stop taking it even if you feel better. General instructions  Follow a diet as told by your doctor.  When you feel better, your doctor may tell you to change your diet. You may need to eat a lot of fiber. Fiber makes it easier to poop (have bowel movements). Healthy foods with fiber include: ? Berries. ? Beans. ? Lentils. ? Green vegetables.  Exercise 3 or more times a week. Aim for 30 minutes each time. Exercise enough to sweat and make your heart beat faster.  Keep all follow-up visits as told. This is important. You may need to have an exam of the large intestine. This is called a colonoscopy. Contact a doctor if:  Your pain does not get better.  You have a hard time eating or drinking.  You are not pooping like normal. Get help right away if:  Your pain gets worse.  Your problems do not get better.  Your problems get worse very fast.  You have a fever.  You throw up (vomit) more than one time.  You have poop that is: ? Bloody. ? Black. ? Tarry. Summary  Diverticulitis is when small pockets in your large intestine (colon) get infected or swollen.  Take medicines only as told by your  doctor.  Follow a diet as told by your doctor. This information is not intended to replace advice given to you by your health care provider. Make sure you discuss any questions you have with your health care provider. Document Released: 03/11/2008 Document Revised: 10/10/2016 Document Reviewed: 10/10/2016 Elsevier Interactive Patient Education  2017 [employer].

## 2018-04-21 NOTE — Progress Notes (Signed)
Surgical Clinic Progress/Follow-up Note   HPI:  72 y.o. Male presents to clinic for follow-up evaluation s/p hospital admission and outpatient antibiotic therapy for his second episode of sigmoid colonic diverticulitis, the first having occurred 7 - 8 years ago, after which patient last underwent colonoscopy. Patient reports complete resolution of his LLQ abdominal pain and says he has been able to tolerate his soft diet with +flatus and +BM's WNL. He denies any fever/chills, N/V, CP, or SOB.  Review of Systems:  Constitutional: denies any other weight loss, fever, chills, or sweats  Eyes: denies any other vision changes, history of eye injury  ENT: denies sore throat, hearing problems  Respiratory: denies shortness of breath, wheezing  Cardiovascular: denies chest pain, palpitations  Gastrointestinal: abdominal pain, N/V, and bowel function as per HPI Musculoskeletal: denies any other joint pains or cramps  Skin: Denies any other rashes or skin discolorations  Neurological: denies any other headache, dizziness, weakness  Psychiatric: denies any other depression, anxiety  All other review of systems: otherwise negative   Vital Signs:  BP 130/78   Pulse (!) 45   Temp (!) 97.4 F (36.3 C) (Oral)   Ht 5\' 10"  (1.778 m)   Wt 192 lb (87.1 kg)   BMI 27.55 kg/m    Physical Exam:  Constitutional:  -- Normal body habitus  -- Awake, alert, and oriented x3  Eyes:  -- Pupils equally round and reactive to light  -- No scleral icterus  Ear, nose, throat:  -- No jugular venous distension  -- No nasal drainage, bleeding Pulmonary:  -- No crackles -- Equal breath sounds bilaterally -- Breathing non-labored at rest Cardiovascular:  -- S1, S2 present  -- No pericardial rubs  Gastrointestinal:  -- Soft, nontender, non-distended, no guarding/rebound  -- No abdominal masses appreciated, pulsatile or otherwise  Musculoskeletal / Integumentary:  -- Wounds or skin discoloration: None  appreciated -- Extremities: B/L UE and LE FROM, hands and feet warm, no edema  Neurologic:  -- Motor function: intact and symmetric  -- Sensation: intact and symmetric   Assessment:  72 y.o. yo Male with a problem list including...  Patient Active Problem List   Diagnosis Date Noted  . Diverticulitis of large intestine with abscess without bleeding 04/03/2018  . Benign essential HTN 02/03/2018  . Mild dementia 11/25/2017  . Mild cognitive impairment 05/27/2017  . Loss of memory 02/04/2017  . Frequent PVCs 07/02/2016  . Palpitations 06/27/2016  . Bradycardia 02/22/2016  . Mixed hyperlipidemia 05/10/2014    presents to clinic for follow-up evaluation s/p hospital admission and outpatient antibiotic therapy for his second episode of sigmoid colonic diverticulitis, the first having occurred 7 - 8 years ago, after which patient last underwent colonoscopy.  Plan:   - complete antibiotics  - continue easy-to-digest foods x 1 more month  - once daily Colace stool softener preferred over periodic mag citrate once already constipated, though can continue Miralax or mag citrate prn  - colonoscopy advised, patient prefers to discuss and schedule with primary care physician  - elective partial colectomy discussed, patient wishes to defer unless recurs  - return to clinic as needed, instructed to call office if any questions or concerns  All of the above recommendations were discussed with the patient, and all of patient's questions were answered to his expressed satisfaction.  -- Marilynne Drivers Rosana Hoes, MD, [place]: [facility] - Partnering for exceptional care. Office: [phone]

## 2018-05-15 ENCOUNTER — Ambulatory Visit: Payer: PPO | Admitting: Podiatry

## 2018-05-15 ENCOUNTER — Encounter: Payer: Self-pay | Admitting: Podiatry

## 2018-05-15 ENCOUNTER — Encounter

## 2018-05-15 VITALS — BP 150/62 | HR 60 | Resp 16

## 2018-05-15 DIAGNOSIS — L600 Ingrowing nail: Secondary | ICD-10-CM | POA: Diagnosis not present

## 2018-05-15 NOTE — Patient Instructions (Signed)

## 2018-05-18 DIAGNOSIS — F0390 Unspecified dementia without behavioral disturbance: Secondary | ICD-10-CM | POA: Diagnosis not present

## 2018-05-18 DIAGNOSIS — G3184 Mild cognitive impairment, so stated: Secondary | ICD-10-CM | POA: Diagnosis not present

## 2018-05-18 NOTE — Progress Notes (Signed)
   Subjective: Patient presents today for evaluation of pain to the medial border of the right hallux that began 3-4 weeks ago. He reports associated redness and swelling of the area. Patient is concerned for possible ingrown nail. Applying pressure to the toe increases the pain. He has been applying fungal cream with minimal relief. Patient presents today for further treatment and evaluation.  Past Medical History:  Diagnosis Date  . Diverticulitis of large intestine with abscess without bleeding 04/03/2018    Objective:  General: Well developed, nourished, in no acute distress, alert and oriented x3   Dermatology: Skin is warm, dry and supple bilateral. Medial border right hallux appears to be erythematous with evidence of an ingrowing nail. Pain on palpation noted to the border of the nail fold. The remaining nails appear unremarkable at this time. There are no open sores, lesions.  Vascular: Dorsalis Pedis artery and Posterior Tibial artery pedal pulses palpable. No lower extremity edema noted.   Neruologic: Grossly intact via light touch bilateral.  Musculoskeletal: Muscular strength within normal limits in all groups bilateral. Normal range of motion noted to all pedal and ankle joints.   Assesement: #1 Paronychia with ingrowing nail medial border right hallux  #2 Pain in toe #3 Incurvated nail  Plan of Care:  1. Patient evaluated.  2. Discussed treatment alternatives and plan of care. Explained nail avulsion procedure and post procedure course to patient. 3. Patient opted for permanent partial nail avulsion.  4. Prior to procedure, local anesthesia infiltration utilized using 3 ml of a 50:50 mixture of 2% plain lidocaine and 0.5% plain marcaine in a normal hallux block fashion and a betadine prep performed.  5. Partial permanent nail avulsion with chemical matrixectomy performed using [id number] applications of phenol followed by alcohol flush.  6. Light dressing applied. 7. Return  to clinic in 2 weeks.   Edrick Kins, DPM [facility]  Dr. Edrick Kins, [place]                                        [place], [place] [address]                Office [phone]  Fax [phone]

## 2018-05-29 ENCOUNTER — Ambulatory Visit: Payer: PPO | Admitting: Podiatry

## 2018-05-29 ENCOUNTER — Encounter: Payer: Self-pay | Admitting: Podiatry

## 2018-05-29 DIAGNOSIS — L600 Ingrowing nail: Secondary | ICD-10-CM | POA: Diagnosis not present

## 2018-05-31 NOTE — Progress Notes (Signed)
   Subjective: Patient presents today 2 weeks post ingrown nail permanent nail avulsion procedure of the medial border of the right hallux. Patient states that the toe and nail fold is feeling much better. Patient is here for further evaluation and treatment.   Past Medical History:  Diagnosis Date  . Diverticulitis of large intestine with abscess without bleeding 04/03/2018    Objective: Skin is warm, dry and supple. Nail and respective nail fold appears to be healing appropriately. Open wound to the associated nail fold with a granular wound base and moderate amount of fibrotic tissue. Minimal drainage noted. Mild erythema around the periungual region likely due to phenol chemical matricectomy.  Assessment: #1 postop permanent partial nail avulsion medial border right hallux  #2 open wound periungual nail fold of respective digit.   Plan of care: #1 patient was evaluated  #2 debridement of open wound was performed to the periungual border of the respective toe using a currette. Antibiotic ointment and Band-Aid was applied. #3 patient is to return to clinic on a PRN basis.   Edrick Kins, DPM [facility]  Dr. Edrick Kins, [place]                                        [place], [place] [address]                Office [phone]  Fax [phone]

## 2018-09-08 ENCOUNTER — Other Ambulatory Visit: Payer: Self-pay

## 2018-09-08 ENCOUNTER — Encounter: Payer: Self-pay | Admitting: Surgery

## 2018-09-08 ENCOUNTER — Ambulatory Visit (INDEPENDENT_AMBULATORY_CARE_PROVIDER_SITE_OTHER): Payer: PPO | Admitting: Surgery

## 2018-09-08 VITALS — BP 159/90 | HR 60 | Temp 96.6°F | Resp 15 | Ht 70.0 in | Wt 191.6 lb

## 2018-09-08 DIAGNOSIS — K5720 Diverticulitis of large intestine with perforation and abscess without bleeding: Secondary | ICD-10-CM

## 2018-09-08 NOTE — Progress Notes (Signed)
Surgical Clinic Progress/Follow-up Note   HPI:  72 y.o. Male presents to clinic for patient-requested follow-up advice and guidance regarding post-diverticulitis diet and bowel regimen for chronic constipation. Patient reports daily soft formed BM's without straining, constipation, or diarrhea, and feels the probiotic supplement he's been taking has helped, though he describes he infrequently experiences mild intermittent/isolated B/L lower abdominal cramps. He denies blood per rectum or persistent abdominal pain, N/V, fever/chills, CP, or SOB. He says he continues to ride his bicycle, though complains that his seat is uncomfortable, and walks outside frequently. He states that he drinks enough water, but indicates he may not eat many foods with higher fiber content.  Review of Systems:  Constitutional: denies any other weight loss, fever, chills, or sweats  Eyes: denies any other vision changes, history of eye injury  ENT: denies sore throat, hearing problems  Respiratory: denies shortness of breath, wheezing  Cardiovascular: denies chest pain, palpitations  Gastrointestinal: abdominal pain, N/V, and bowel function as per HPI Musculoskeletal: denies any other joint pains or cramps  Skin: Denies any other rashes or skin discolorations  Neurological: denies any other headache, dizziness, weakness  Psychiatric: denies any other depression, anxiety  All other review of systems: otherwise negative   Vital Signs:  BP (!) 159/90   Pulse 60   Temp (!) 96.6 F (35.9 C) (Temporal)   Resp 15   Ht 5\' 10"  (1.778 m)   Wt 191 lb 9.6 oz (86.9 kg)   SpO2 97%   BMI 27.49 kg/m    Physical Exam:  Constitutional:  -- Normal body habitus  -- Awake, alert, and oriented x3  Eyes:  -- Pupils equally round and reactive to light  -- No scleral icterus  Ear, nose, throat:  -- No jugular venous distension  -- No nasal drainage, bleeding Pulmonary:  -- No crackles -- Equal breath sounds  bilaterally -- Breathing non-labored at rest Cardiovascular:  -- S1, S2 present  -- No pericardial rubs  Gastrointestinal:  -- Soft, nontender, non-distended, no guarding/rebound  -- No abdominal masses appreciated, pulsatile or otherwise  Musculoskeletal / Integumentary:  -- Wounds or skin discoloration: None appreciated  -- Extremities: B/L UE and LE FROM, hands and feet warm, no edema  Neurologic:  -- Motor function: intact and symmetric  -- Sensation: intact and symmetric   Imaging: No new pertinent imaging studies available for review   Assessment:  72 y.o. yo Male with a problem list including...  Patient Active Problem List   Diagnosis Date Noted  . Benign essential HTN 02/03/2018  . Mild dementia ([place]) 11/25/2017  . Mild cognitive impairment 05/27/2017  . Loss of memory 02/04/2017  . Frequent PVCs 07/02/2016  . Palpitations 06/27/2016  . Bradycardia 02/22/2016  . Mixed hyperlipidemia 05/10/2014    presents to clinic for patient-requested follow-up advice and guidance regarding post-diverticulitis diet and bowel regimen for chronic constipation, doing otherwise overall well s/p recent second episode of uncomplicated sigmoid colonic diverticulitis, the first having occurred 7 - 8 years ago.  Plan:   - discussed with patient natural history of diverticulosis/diverticulitis  - advised to maintain adequate hydration and if unable/unwilling to eat high-fiber dietary foods, consider once daily Metamucil or similar dietary fiber supplement +/- stool softener prn  - medical management of comorbidities as per primary care, continue exercises  - return to clinic as needed, instructed to call office if any questions or concerns  All of the above recommendations were discussed with the patient, and all of patient's  questions were answered to his expressed satisfaction.  -- Marilynne Drivers Rosana Hoes, MD, [place]: [facility] - Partnering for  exceptional care. Office: [phone]

## 2018-09-08 NOTE — Patient Instructions (Addendum)
Patient is to return to the office as needed.   Call the office with any questions or concerns.  Psyllium Chewable bars or wafers What is this medicine? PSYLLIUM (SIL i yum) is a bulk-forming fiber laxative. This medicine is used to treat constipation. Increasing fiber in the diet may also help lower cholesterol and promote heart health for some people. This medicine may be used for other purposes; ask your health care provider or pharmacist if you have questions. COMMON BRAND NAME(S): Metamucil What should I tell my health care provider before I take this medicine? They need to know if you have any of these conditions: -change in bowel habits for more than 14 days -blocked intestines or bowel -stomach pain, nausea, or vomiting -trouble swallowing -an unusual or allergic reaction to psyllium, other medicines, dyes, or preservatives -pregnant or trying or get pregnant -breast-feeding How should I use this medicine? Take this medicine by mouth with a full glass of water. Follow the directions on the package labeling, or take as directed by your health care professional. Take your medicine at regular intervals. Do not take your medicine more often than directed. Talk to your pediatrician regarding the use of this medicine in children. While this drug may be prescribed for children as young as 49 years old for selected conditions, precautions do apply. Overdosage: If you think you have taken too much of this medicine contact a poison control center or emergency room at once. NOTE: This medicine is only for you. Do not share this medicine with others. What if I miss a dose? If you miss a dose, take it as soon as you can. If it is almost time for your next dose, take only that dose. Do not take double or extra doses. What may interact with this medicine? Interactions are not expected. This list may not describe all possible interactions. Give your health care provider a list of all the medicines,  herbs, non-prescription drugs, or dietary supplements you use. Also tell them if you smoke, drink alcohol, or use illegal drugs. Some items may interact with your medicine. What should I watch for while using this medicine? This medicine can take up to 3 days to work. Check with your doctor or health care professional if your symptoms do not start to get better or if they get worse. See your doctor if you have to treat your constipation for more than 1 week. Avoid taking other medicines within 2 hours of taking this medicine. Drink several glasses of water a day while you are taking this medicine. This will help to relieve constipation and prevent dehydration. What side effects may I notice from receiving this medicine? Side effects that you should report to your doctor or health care professional as soon as possible: -allergic reactions like skin rash, itching or hives, swelling of the face, lips, or tongue -breathing problems -chest pain -nausea, vomiting -rectal bleeding -trouble swallowing Side effects that usually do not require medical attention (report to your doctor or health care professional if they continue or are bothersome): -bloated or 'gassy' feeling -diarrhea -headache -stomach cramps This list may not describe all possible side effects. Call your doctor for medical advice about side effects. You may report side effects to FDA at 1-800-FDA-1088. Where should I keep my medicine? Keep out of the reach of children. Store at room temperature between 15 and 30 degrees C (59 and 86 degrees F). Protect from moisture. Throw away any unused medicine after the expiration date. NOTE: This  sheet is a summary. It may not cover all possible information. If you have questions about this medicine, talk to your doctor, pharmacist, or health care provider.  2018 Elsevier/Gold Standard (2015-08-02 08:34:33)   High-Fiber Diet Fiber, also called dietary fiber, is a type of carbohydrate found  in fruits, vegetables, whole grains, and beans. A high-fiber diet can have many health benefits. Your health care provider may recommend a high-fiber diet to help:  Prevent constipation. Fiber can make your bowel movements more regular.  Lower your cholesterol.  Relieve hemorrhoids, uncomplicated diverticulosis, or irritable bowel syndrome.  Prevent overeating as part of a weight-loss plan.  Prevent heart disease, type 2 diabetes, and certain cancers.  What is my plan? The recommended daily intake of fiber includes:  38 grams for men under age 41.  68 grams for men over age 64.  74 grams for women under age 36.  59 grams for women over age 23.  You can get the recommended daily intake of dietary fiber by eating a variety of fruits, vegetables, grains, and beans. Your health care provider may also recommend a fiber supplement if it is not possible to get enough fiber through your diet. What do I need to know about a high-fiber diet?  Fiber supplements have not been widely studied for their effectiveness, so it is better to get fiber through food sources.  Always check the fiber content on thenutrition facts label of any prepackaged food. Look for foods that contain at least 5 grams of fiber per serving.  Ask your dietitian if you have questions about specific foods that are related to your condition, especially if those foods are not listed in the following section.  Increase your daily fiber consumption gradually. Increasing your intake of dietary fiber too quickly may cause bloating, cramping, or gas.  Drink plenty of water. Water helps you to digest fiber. What foods can I eat? Grains Whole-grain breads. Multigrain cereal. Oats and oatmeal. Brown rice. Barley. Bulgur wheat. Reynolds. Bran muffins. Popcorn. Rye wafer crackers. Vegetables Sweet potatoes. Spinach. Kale. Artichokes. Cabbage. Broccoli. Green peas. Carrots. Squash. Fruits Berries. Pears. Apples. Oranges. Avocados.  Prunes and raisins. Dried figs. Meats and Other Protein Sources Navy, kidney, pinto, and soy beans. Split peas. Lentils. Nuts and seeds. Dairy Fiber-fortified yogurt. Beverages Fiber-fortified soy milk. Fiber-fortified orange juice. Other Fiber bars. The items listed above may not be a complete list of recommended foods or beverages. Contact your dietitian for more options. What foods are not recommended? Grains White bread. Pasta made with refined flour. White rice. Vegetables Fried potatoes. Canned vegetables. Well-cooked vegetables. Fruits Fruit juice. Cooked, strained fruit. Meats and Other Protein Sources Fatty cuts of meat. Fried Sales executive or fried fish. Dairy Milk. Yogurt. Cream cheese. Sour cream. Beverages Soft drinks. Other Cakes and pastries. Butter and oils. The items listed above may not be a complete list of foods and beverages to avoid. Contact your dietitian for more information. What are some tips for including high-fiber foods in my diet?  Eat a wide variety of high-fiber foods.  Make sure that half of all grains consumed each day are whole grains.  Replace breads and cereals made from refined flour or white flour with whole-grain breads and cereals.  Replace white rice with brown rice, bulgur wheat, or millet.  Start the day with a breakfast that is high in fiber, such as a cereal that contains at least 5 grams of fiber per serving.  Use beans in place of meat in soups,  salads, or pasta.  Eat high-fiber snacks, such as berries, raw vegetables, nuts, or popcorn. This information is not intended to replace advice given to you by your health care provider. Make sure you discuss any questions you have with your health care provider. Document Released: 09/23/2005 Document Revised: 02/29/2016 Document Reviewed: 03/08/2014 Elsevier Interactive Patient Education  2018 [place].   Abdominal Bloating When you have abdominal bloating, your abdomen may feel  full, tight, or painful. It may also look bigger than normal or swollen (distended). Common causes of abdominal bloating include:  Swallowing air.  Constipation.  Problems digesting food.  Eating too much.  Irritable bowel syndrome. This is a condition that affects the large intestine.  Lactose intolerance. This is an inability to digest lactose, a natural sugar in dairy products.  Celiac disease. This is a condition that affects the ability to digest gluten, a protein found in some grains.  Gastroparesis. This is a condition that slows down the movement of food in the stomach and small intestine. It is more common in people with diabetes mellitus.  Gastroesophageal reflux disease (GERD). This is a digestive condition that makes stomach acid flow back into the esophagus.  Urinary retention. This means that the body is holding onto urine, and the bladder cannot be emptied all the way.  Follow these instructions at home: Eating and drinking  Avoid eating too much.  Try not to swallow air while talking or eating.  Avoid eating while lying down.  Avoid these foods and drinks: ? Foods that cause gas, such as broccoli, cabbage, cauliflower, and baked beans. ? Carbonated drinks. ? Hard candy. ? Chewing gum. Medicines  Take over-the-counter and prescription medicines only as told by your health care provider.  Take probiotic medicines. These medicines contain live bacteria or yeasts that can help digestion.  Take coated peppermint oil capsules. Activity  Try to exercise regularly. Exercise may help to relieve bloating that is caused by gas and relieve constipation. General instructions  Keep all follow-up visits as told by your health care provider. This is important. Contact a health care provider if:  You have nausea and vomiting.  You have diarrhea.  You have abdominal pain.  You have unusual weight loss or weight gain.  You have severe pain, and medicines do not  help. Get help right away if:  You have severe chest pain.  You have trouble breathing.  You have shortness of breath.  You have trouble urinating.  You have darker urine than normal.  You have blood in your stools or have dark, tarry stools. Summary  Abdominal bloating means that the abdomen is swollen.  Common causes of abdominal bloating are swallowing air, constipation, and problems digesting food.  Avoid eating too much and avoid swallowing air.  Avoid foods that cause gas, carbonated drinks, hard candy, and chewing gum. This information is not intended to replace advice given to you by your health care provider. Make sure you discuss any questions you have with your health care provider. Document Released: 10/25/2016 Document Revised: 10/25/2016 Document Reviewed: 10/25/2016 Elsevier Interactive Patient Education  [employer].

## 2018-09-09 ENCOUNTER — Encounter: Payer: Self-pay | Admitting: Surgery

## 2018-09-10 DIAGNOSIS — J309 Allergic rhinitis, unspecified: Secondary | ICD-10-CM | POA: Diagnosis not present

## 2018-09-24 DIAGNOSIS — J309 Allergic rhinitis, unspecified: Secondary | ICD-10-CM | POA: Diagnosis not present

## 2018-09-24 DIAGNOSIS — J3089 Other allergic rhinitis: Secondary | ICD-10-CM | POA: Diagnosis not present

## 2018-10-12 DIAGNOSIS — R002 Palpitations: Secondary | ICD-10-CM | POA: Diagnosis not present

## 2018-10-12 DIAGNOSIS — I10 Essential (primary) hypertension: Secondary | ICD-10-CM | POA: Diagnosis not present

## 2018-10-12 DIAGNOSIS — E782 Mixed hyperlipidemia: Secondary | ICD-10-CM | POA: Diagnosis not present

## 2018-10-12 DIAGNOSIS — I493 Ventricular premature depolarization: Secondary | ICD-10-CM | POA: Diagnosis not present

## 2018-11-18 DIAGNOSIS — F0390 Unspecified dementia without behavioral disturbance: Secondary | ICD-10-CM | POA: Diagnosis not present

## 2019-01-11 DIAGNOSIS — E039 Hypothyroidism, unspecified: Secondary | ICD-10-CM | POA: Diagnosis not present

## 2019-01-11 DIAGNOSIS — D333 Benign neoplasm of cranial nerves: Secondary | ICD-10-CM | POA: Diagnosis not present

## 2019-01-11 DIAGNOSIS — L578 Other skin changes due to chronic exposure to nonionizing radiation: Secondary | ICD-10-CM | POA: Diagnosis not present

## 2019-01-11 DIAGNOSIS — L219 Seborrheic dermatitis, unspecified: Secondary | ICD-10-CM | POA: Diagnosis not present

## 2019-01-11 DIAGNOSIS — R42 Dizziness and giddiness: Secondary | ICD-10-CM | POA: Diagnosis not present

## 2019-01-11 DIAGNOSIS — E785 Hyperlipidemia, unspecified: Secondary | ICD-10-CM | POA: Diagnosis not present

## 2019-01-11 DIAGNOSIS — Z1339 Encounter for screening examination for other mental health and behavioral disorders: Secondary | ICD-10-CM | POA: Diagnosis not present

## 2019-01-11 DIAGNOSIS — Z1331 Encounter for screening for depression: Secondary | ICD-10-CM | POA: Diagnosis not present

## 2019-01-11 DIAGNOSIS — L718 Other rosacea: Secondary | ICD-10-CM | POA: Diagnosis not present

## 2019-01-11 DIAGNOSIS — L570 Actinic keratosis: Secondary | ICD-10-CM | POA: Diagnosis not present

## 2019-01-11 DIAGNOSIS — L821 Other seborrheic keratosis: Secondary | ICD-10-CM | POA: Diagnosis not present

## 2019-01-12 DIAGNOSIS — I10 Essential (primary) hypertension: Secondary | ICD-10-CM | POA: Diagnosis not present

## 2019-01-12 DIAGNOSIS — R7309 Other abnormal glucose: Secondary | ICD-10-CM | POA: Diagnosis not present

## 2019-01-12 DIAGNOSIS — E785 Hyperlipidemia, unspecified: Secondary | ICD-10-CM | POA: Diagnosis not present

## 2019-01-19 DIAGNOSIS — Z125 Encounter for screening for malignant neoplasm of prostate: Secondary | ICD-10-CM | POA: Diagnosis not present

## 2019-01-19 DIAGNOSIS — K4090 Unilateral inguinal hernia, without obstruction or gangrene, not specified as recurrent: Secondary | ICD-10-CM | POA: Diagnosis not present

## 2019-01-19 DIAGNOSIS — Z23 Encounter for immunization: Secondary | ICD-10-CM | POA: Diagnosis not present

## 2019-01-19 DIAGNOSIS — Z0000 Encounter for general adult medical examination without abnormal findings: Secondary | ICD-10-CM | POA: Diagnosis not present

## 2019-01-19 DIAGNOSIS — R413 Other amnesia: Secondary | ICD-10-CM | POA: Diagnosis not present

## 2019-01-19 DIAGNOSIS — E785 Hyperlipidemia, unspecified: Secondary | ICD-10-CM | POA: Diagnosis not present

## 2019-01-19 DIAGNOSIS — R1031 Right lower quadrant pain: Secondary | ICD-10-CM | POA: Diagnosis not present

## 2019-02-01 IMAGING — CT CT ABD-PELV W/ CM
2 of 6 series · 15 of 46 positions shown, 17 images · IV contrast (APPLIED)
Comparison: Prior CT scan of the abdomen and pelvis 05/06/2013

CLINICAL DATA: 72-year-old male with intermittent stabbing lower
abdominal pain for the past 3 months as well as loose mucousy
stools. Past history of diverticulitis.

EXAM:
CT ABDOMEN AND PELVIS WITH CONTRAST
TECHNIQUE: Multidetector CT imaging of the abdomen and pelvis was performed
using the standard protocol following bolus administration of
intravenous contrast.
CONTRAST:  75mL OMNIPAQUE IOHEXOL 350 MG/ML SOLN

[Series 2: routine abd/pel with · axial · 0.79mm/px · z∈[-771,-336]mm · 12 of 101 slices shown, 14 images]
[im 7/101  soft-tissue]
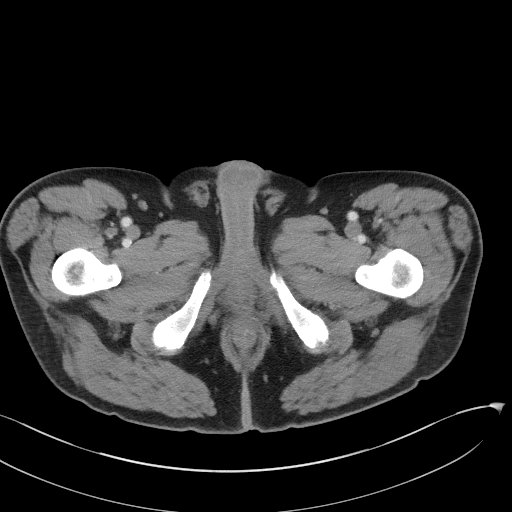
[im 7/101  bone]
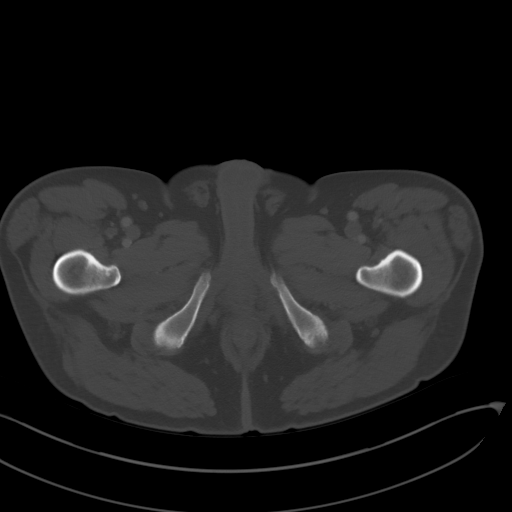
[im 14/101  soft-tissue]
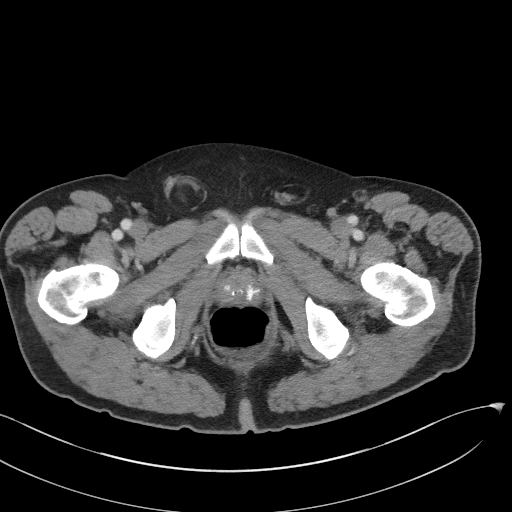
[im 21/101  soft-tissue]
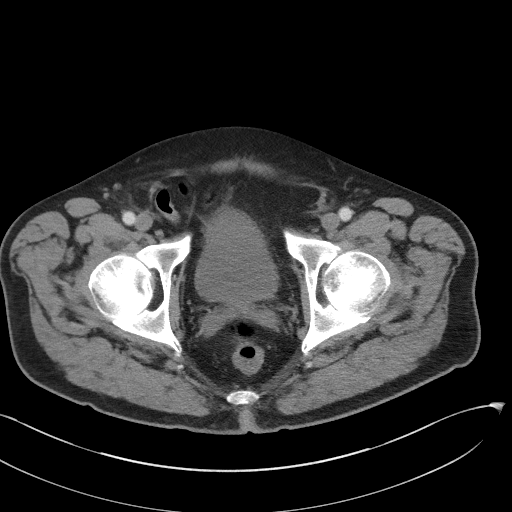
[im 34/101  soft-tissue]
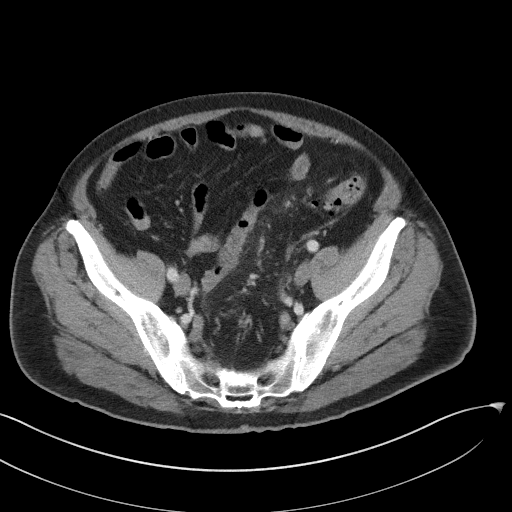
[im 41/101  soft-tissue]
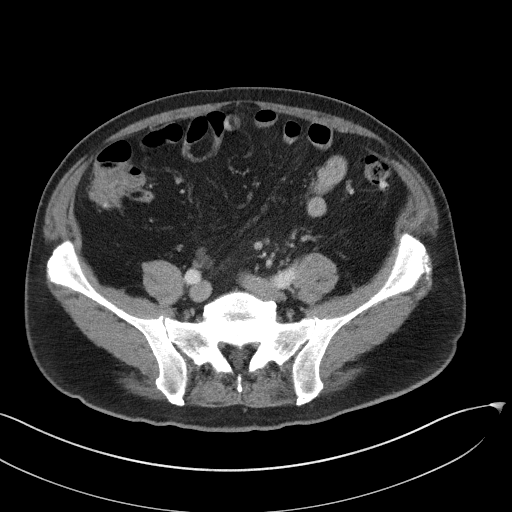
[im 47/101  soft-tissue]
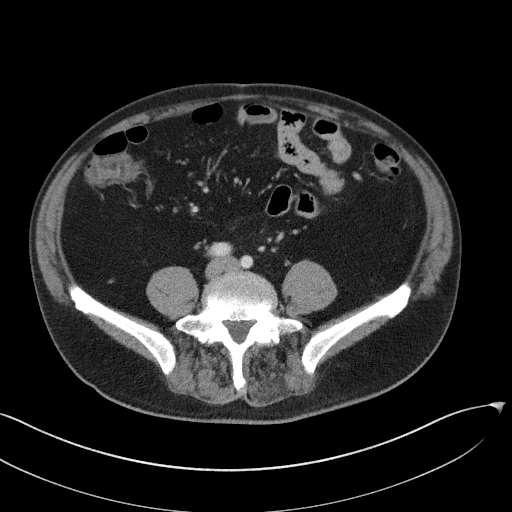
[im 54/101  soft-tissue]
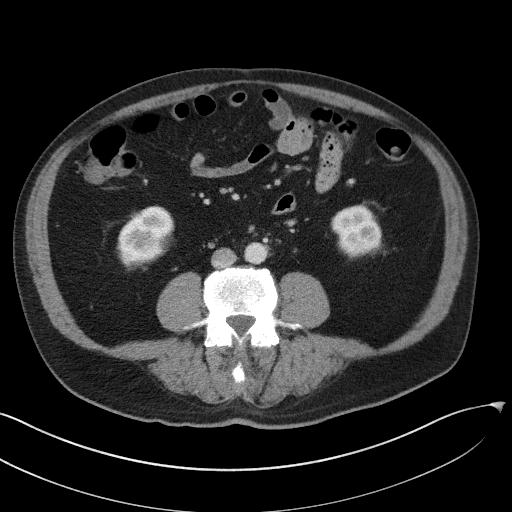
[im 61/101  soft-tissue]
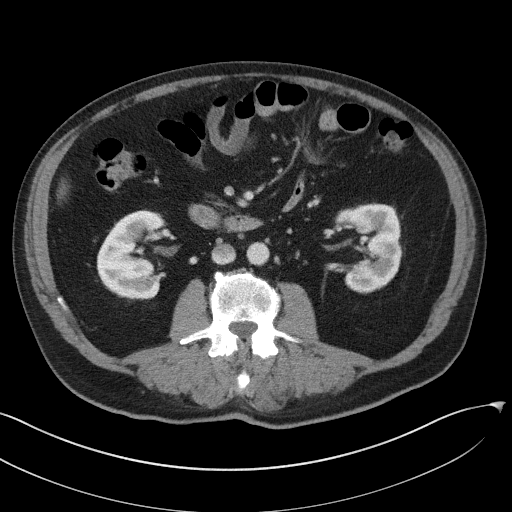
[im 67/101  soft-tissue]
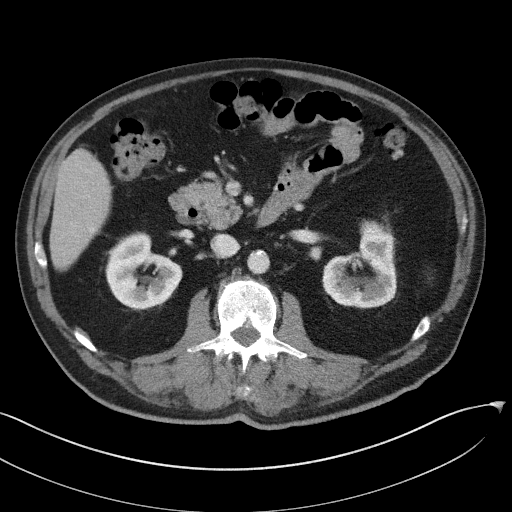
[im 67/101  bone]
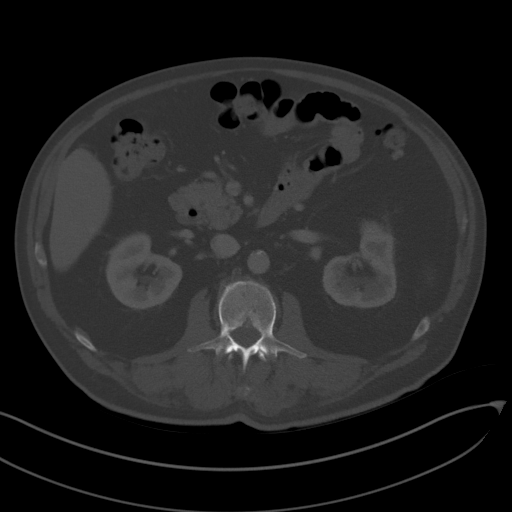
[im 81/101  soft-tissue]
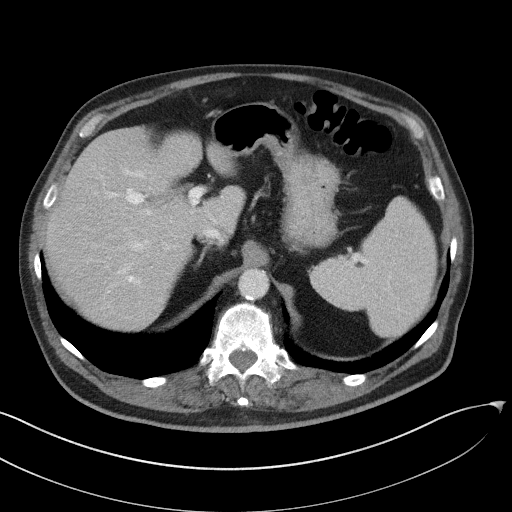
[im 87/101  soft-tissue]
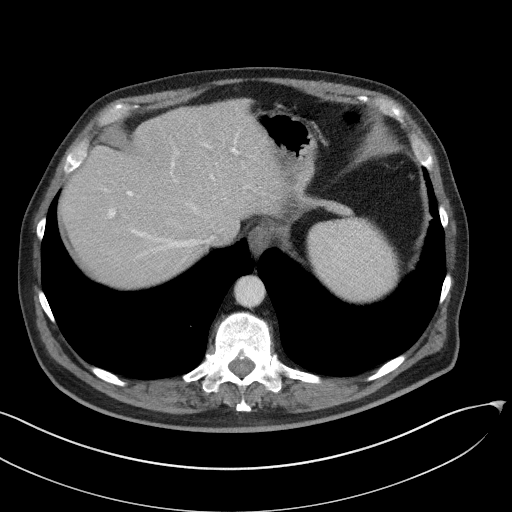
[im 94/101  soft-tissue]
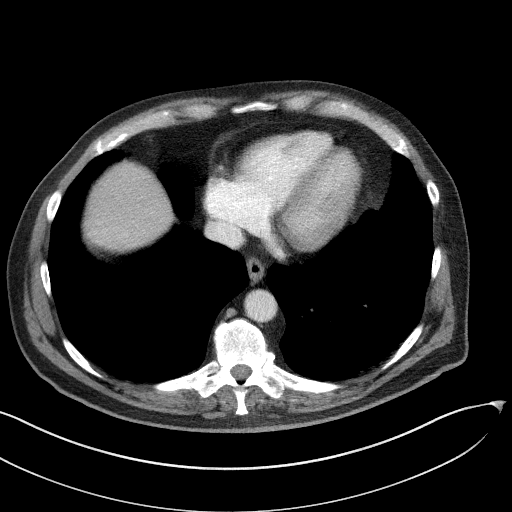

[Series 5: coronal st · coronal · 0.85mm/px · 3 of 101 slices shown]
[im 34/101  soft-tissue]
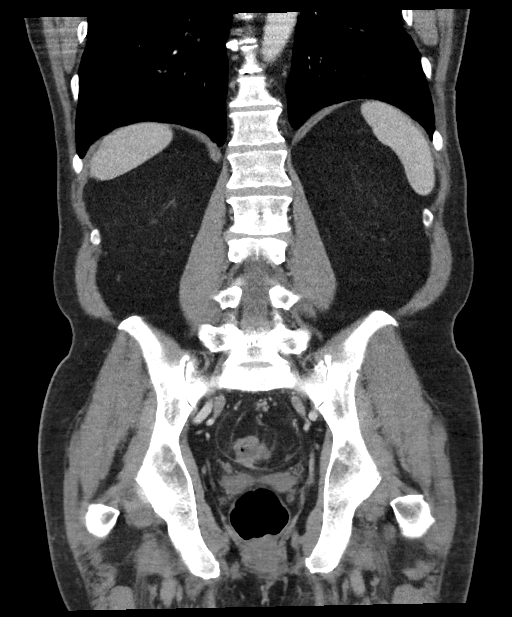
[im 45/101  soft-tissue]
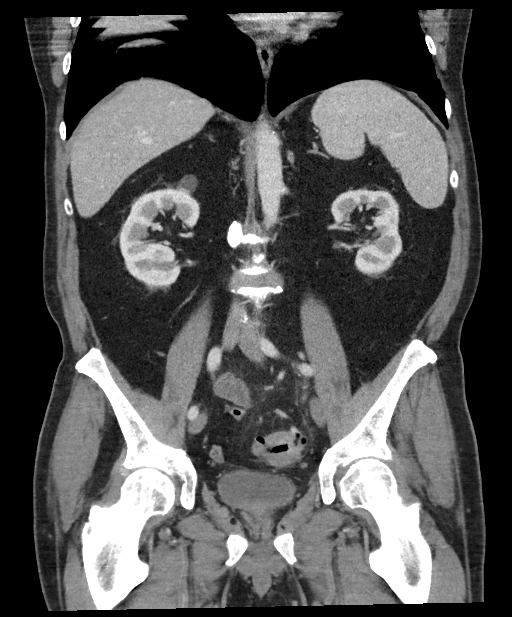
[im 56/101  soft-tissue]
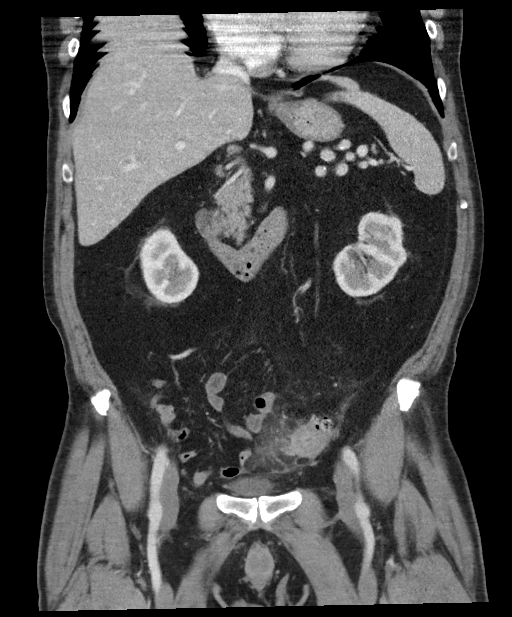

[15 of 46 positions shown; findings below may reference images not displayed]

FINDINGS: Lower chest: The lung bases are clear. Borderline cardiomegaly. No
pericardial effusion. Unremarkable visualized distal thoracic
esophagus.

Hepatobiliary: No focal liver abnormality is seen. Status post
cholecystectomy. No biliary dilatation.

Pancreas: Unremarkable. No pancreatic ductal dilatation or
surrounding inflammatory changes.

Spleen: Normal in size without focal abnormality.

Adrenals/Urinary Tract: Normal adrenal glands. Circumscribed water
attenuation cystic lesions in the right kidney are consistent with
simple cysts. Punctate nonobstructing nephrolithiasis bilaterally.
No hydronephrosis. No evidence of enhancing renal mass.

Stomach/Bowel: Significant sigmoid colonic diverticulosis. There is
hyperenhancement and thickening of the sigmoid colonic walls on
region of diverticulosis consistent with diverticulitis. There is a
small intramural fluid collection within the colonic wall measuring
1.9 x 1.6 cm consistent with an intramural abscess. Inflammatory
stranding is also present in the pericolonic fat. No definite free
air. Normal appendix in the right lower quadrant. No evidence of
bowel obstruction.

Vascular/Lymphatic: Mild atherosclerotic vascular calcifications. No
evidence of aneurysm or dissection.

Reproductive: Prostate is unremarkable.

Other: Omental fat containing right inguinal hernia. Small free
fluid in the pelvis is likely reactive.

Musculoskeletal: No acute fracture or aggressive appearing lytic or
blastic osseous lesion. L5-S1 degenerative disc disease.
IMPRESSION: 1. Acute sigmoid diverticulitis complicated by a small (1.9 x
cm) intramural abscess. Given the small size of the abscess and the
location within the colonic wall, this fluid collection is not
amenable to percutaneous drainage at this time.
2. Trace free fluid in the pelvis is likely reactive.
3. Borderline cardiomegaly.
4. Bilateral renal cysts.
5. Omental fat containing right inguinal hernia.
6.  Aortic Atherosclerosis ([id number]-170.0).

## 2019-02-02 DIAGNOSIS — K4090 Unilateral inguinal hernia, without obstruction or gangrene, not specified as recurrent: Secondary | ICD-10-CM | POA: Diagnosis not present

## 2019-02-02 DIAGNOSIS — R197 Diarrhea, unspecified: Secondary | ICD-10-CM | POA: Insufficient documentation

## 2019-03-12 DIAGNOSIS — Z125 Encounter for screening for malignant neoplasm of prostate: Secondary | ICD-10-CM | POA: Diagnosis not present

## 2019-04-06 DIAGNOSIS — J3089 Other allergic rhinitis: Secondary | ICD-10-CM | POA: Diagnosis not present

## 2019-06-16 DIAGNOSIS — R413 Other amnesia: Secondary | ICD-10-CM | POA: Diagnosis not present

## 2019-07-21 DIAGNOSIS — R1084 Generalized abdominal pain: Secondary | ICD-10-CM | POA: Diagnosis not present

## 2019-09-28 DIAGNOSIS — R413 Other amnesia: Secondary | ICD-10-CM | POA: Diagnosis not present

## 2019-09-28 DIAGNOSIS — F0390 Unspecified dementia without behavioral disturbance: Secondary | ICD-10-CM | POA: Diagnosis not present

## 2019-10-14 DIAGNOSIS — I493 Ventricular premature depolarization: Secondary | ICD-10-CM | POA: Diagnosis not present

## 2019-10-14 DIAGNOSIS — E782 Mixed hyperlipidemia: Secondary | ICD-10-CM | POA: Diagnosis not present

## 2019-10-14 DIAGNOSIS — I10 Essential (primary) hypertension: Secondary | ICD-10-CM | POA: Diagnosis not present

## 2019-10-14 DIAGNOSIS — R001 Bradycardia, unspecified: Secondary | ICD-10-CM | POA: Diagnosis not present

## 2019-10-20 DIAGNOSIS — R300 Dysuria: Secondary | ICD-10-CM | POA: Diagnosis not present

## 2019-10-20 DIAGNOSIS — R1030 Lower abdominal pain, unspecified: Secondary | ICD-10-CM | POA: Diagnosis not present

## 2020-01-25 ENCOUNTER — Other Ambulatory Visit: Payer: Self-pay | Admitting: Dermatology

## 2020-11-02 DIAGNOSIS — K4090 Unilateral inguinal hernia, without obstruction or gangrene, not specified as recurrent: Secondary | ICD-10-CM | POA: Diagnosis not present

## 2020-11-02 DIAGNOSIS — R1084 Generalized abdominal pain: Secondary | ICD-10-CM | POA: Diagnosis not present

## 2020-11-02 DIAGNOSIS — R1032 Left lower quadrant pain: Secondary | ICD-10-CM | POA: Diagnosis not present

## 2020-12-12 DIAGNOSIS — I493 Ventricular premature depolarization: Secondary | ICD-10-CM | POA: Diagnosis not present

## 2020-12-12 DIAGNOSIS — R001 Bradycardia, unspecified: Secondary | ICD-10-CM | POA: Diagnosis not present

## 2020-12-12 DIAGNOSIS — I10 Essential (primary) hypertension: Secondary | ICD-10-CM | POA: Diagnosis not present

## 2021-01-03 DIAGNOSIS — R413 Other amnesia: Secondary | ICD-10-CM | POA: Diagnosis not present

## 2021-01-03 DIAGNOSIS — Z0000 Encounter for general adult medical examination without abnormal findings: Secondary | ICD-10-CM | POA: Diagnosis not present

## 2021-01-03 DIAGNOSIS — L989 Disorder of the skin and subcutaneous tissue, unspecified: Secondary | ICD-10-CM | POA: Diagnosis not present

## 2021-01-03 DIAGNOSIS — K4090 Unilateral inguinal hernia, without obstruction or gangrene, not specified as recurrent: Secondary | ICD-10-CM | POA: Diagnosis not present

## 2021-01-03 DIAGNOSIS — I10 Essential (primary) hypertension: Secondary | ICD-10-CM | POA: Diagnosis not present

## 2021-01-03 DIAGNOSIS — Z125 Encounter for screening for malignant neoplasm of prostate: Secondary | ICD-10-CM | POA: Diagnosis not present

## 2021-01-03 DIAGNOSIS — R001 Bradycardia, unspecified: Secondary | ICD-10-CM | POA: Diagnosis not present

## 2021-01-03 DIAGNOSIS — E782 Mixed hyperlipidemia: Secondary | ICD-10-CM | POA: Diagnosis not present

## 2021-02-20 ENCOUNTER — Telehealth: Payer: Self-pay | Admitting: Adult Health Nurse Practitioner

## 2021-02-20 NOTE — Telephone Encounter (Signed)
Spoke with patient's son, Seth Thompson, regarding Palliative referral/services and all questions were answered and he was in agreement with scheduling visit.  I have scheduled an In-home Consult for 02/27/21 @ 12:30 PM.

## 2021-02-26 ENCOUNTER — Telehealth: Payer: Self-pay | Admitting: Adult Health Nurse Practitioner

## 2021-02-26 NOTE — Telephone Encounter (Signed)
Spoke with son to remind of appointment tomorrow Seth Kassem K. Olena Heckle NP

## 2021-02-27 ENCOUNTER — Other Ambulatory Visit: Payer: Self-pay

## 2021-02-27 ENCOUNTER — Encounter: Payer: Self-pay | Admitting: Adult Health Nurse Practitioner

## 2021-02-27 ENCOUNTER — Other Ambulatory Visit: Payer: PPO | Admitting: Adult Health Nurse Practitioner

## 2021-02-27 VITALS — BP 144/62 | HR 45

## 2021-02-27 DIAGNOSIS — F03A0 Unspecified dementia, mild, without behavioral disturbance, psychotic disturbance, mood disturbance, and anxiety: Secondary | ICD-10-CM

## 2021-02-27 DIAGNOSIS — R001 Bradycardia, unspecified: Secondary | ICD-10-CM | POA: Diagnosis not present

## 2021-02-27 DIAGNOSIS — F0390 Unspecified dementia without behavioral disturbance: Secondary | ICD-10-CM

## 2021-02-27 DIAGNOSIS — Z515 Encounter for palliative care: Secondary | ICD-10-CM

## 2021-02-27 DIAGNOSIS — K5900 Constipation, unspecified: Secondary | ICD-10-CM

## 2021-02-27 NOTE — Progress Notes (Signed)
[facility] Consult Note Telephone: [phone]  Fax: [phone]    Date of encounter: 02/27/21 PATIENT NAME: Seth Thompson [place] [id number]   [phone] (home)  DOB: 1945-12-02 MRN: [id number] PRIMARY CARE PROVIDER:    Dr. Maryland Pink  REFERRING PROVIDER:   Dr. Maryland Pink  RESPONSIBLE PARTY:    Contact Information    Name Relation Home Work [place], [place] Son   [phone]   Seth Thompson [phone]         I met face to face with patient and family in home. Palliative Care was asked to follow this patient by consultation request of  Dr. Maryland Pink to address advance care planning and complex medical decision making. This is the initial visit.   Sons present during visit today                                    ASSESSMENT AND PLAN / RECOMMENDATIONS:   Advance Care Planning/Goals of Care: Goals include to maximize quality of life and symptom management.   CODE STATUS:  Full code  Symptom Management/Plan:  Dementia: Patient is having worsening cognitive decline.  Have concerns about patient being able to take care of himself and also with concerns of him riding his bike and driving if he is starting to forget where he is at.  And concerns that he is forgetting to do tasks to take care of himself such as bathing.  Tried discussing these concerns with the patient and he does not feel as if he has any problems doing these activities.  He discussed with sons possible placement in ALF.  Left list of agencies for in-home help and contact information for [employer].  Patient does not have financial or healthcare power of attorney and sons unsure of next steps.  Put in social work referral to help address some of these concerns.  Bradycardia: Patient's heart rate is low today at 45.  He denies dizziness, chest pain, palpitations today.  Patient not on any medication for  this.  Constipation: Believe patient's abdominal pain was related to constipation.  He does state that he had a bowel movement yesterday and feels much better.  Discussed using MiraLAX as needed.  Follow up Palliative Care Visit: Palliative care will continue to follow for complex medical decision making, advance care planning, and clarification of goals.  Patient and family mostly needs social work involvement at this time.  Will call in 3 to 4 weeks to see if follow-up provider visit is needed.  Encouraged to call with any questions or concerns  I spent 75 minutes providing this consultation. More than 50% of the time in this consultation was spent in counseling and care coordination.  PPS: 60%  HOSPICE ELIGIBILITY/DIAGNOSIS: TBD  Chief Complaint: Initial palliative visit  HISTORY OF PRESENT ILLNESS:  Seth Thompson is a 75 y.o. year old male  with dementia, bradycardia, HLD, palpitations, frequent PVCs, HTN, BPH, inguinal hernia, history of diverticulitis and gallstones.  Patient lives at home with son and disabled daughter.  Patient is having more forgetfulness and does admit that when he goes for bike ride at times forgets where he is at.  Son does state that he does continue to drive but sometimes will forget where he is parked.  Patient has had no accidents while driving or bike riding and denies  falls in the home.  Son does state that a few weeks ago his dad called EMS due to lower abdominal pain and DSS was called for concerns that he was not taking care of his daughter as he should be.  Son does state that there you are looking into placement for his daughter.  Patient can converse but repeats himself often and does have problems finding words to express what he wants to say.  During conversation he did forget his son's name today.  Son states that he has been forgetting to pay his bills and both his sons have concerns that he has not behaved in about 3 weeks.  Patient does state that he  gets dizziness rarely and that it is short-lived.  States that his appetite is good with weight stable in the 180s.  Patient independent of ADLs but may need reminding to do things like bathing.  He is able to help with light housecleaning.  Son helps with meals but he is able to make simple things such as sandwiches.  History obtained from review of EMR and interview with family and Seth Thompson.  I reviewed available labs, medications, imaging, studies and related documents from the EMR.  Records reviewed and summarized above.   Physical Exam  Constitutional: NAD General: WNWD EYES: anicteric sclera, lids intact, no discharge  ENMT: intact hearing, oral mucous membranes moist CV: S1S2, RRR, no LE edema Pulmonary: LCTA, no increased work of breathing, no cough Abdomen:  normo-active BS + 4 quadrants, soft and non tender MSK: moves all extremities, ambulatory without assistive devices Skin:  no rashes on visible skin Neuro:  A and O to person and place Hem/lymph/immuno: no widespread bruising   CURRENT PROBLEM LIST:  Patient Active Problem List   Diagnosis Date Noted  . Benign essential HTN 02/03/2018  . Mild dementia ([place]) 11/25/2017  . Mild cognitive impairment 05/27/2017  . Loss of memory 02/04/2017  . Frequent PVCs 07/02/2016  . Palpitations 06/27/2016  . Bradycardia 02/22/2016  . Mixed hyperlipidemia 05/10/2014   PAST MEDICAL HISTORY:  Active Ambulatory Problems    Diagnosis Date Noted  . Benign essential HTN 02/03/2018  . Bradycardia 02/22/2016  . Frequent PVCs 07/02/2016  . Loss of memory 02/04/2017  . Mild cognitive impairment 05/27/2017  . Mild dementia ([place]) 11/25/2017  . Mixed hyperlipidemia 05/10/2014  . Palpitations 06/27/2016   Resolved Ambulatory Problems    Diagnosis Date Noted  . Diverticulitis of large intestine with abscess without bleeding 04/03/2018   No Additional Past Medical History   SOCIAL HX:  Social History   Tobacco Use  . Smoking  status: Never Smoker  . Smokeless tobacco: Never Used  Substance Use Topics  . Alcohol use: Not Currently   FAMILY HX:  Problem Relation Age of Onset  . High blood pressure (Hypertension) Mother  . Dementia Mother  . Crohn's disease Father  . Coronary Artery Disease (Blocked arteries around heart) Father  . Myocardial Infarction (Heart attack) Father  . Skin cancer Sister  . Heart disease Other  grandparents    ALLERGIES:  Allergies  Allergen Reactions  . Codeine Other (See Comments)     PERTINENT MEDICATIONS:  Outpatient Encounter Medications as of 02/27/2021  Medication Sig  . donepezil (ARICEPT) 10 MG tablet Take 15 mg by mouth at bedtime.  Marland Kitchen doxycycline (VIBRAMYCIN) 50 MG capsule Take 1 capsule by mouth daily.  . fenofibrate (TRICOR) 145 MG tablet Take 1 tablet by mouth daily.  Marland Kitchen ketoconazole (NIZORAL) 2 %  cream Apply 1 application topically 3 (three) times a week.   No facility-administered encounter medications on file as of 02/27/2021.    Thank you for the opportunity to participate in the care of Mr. Chock.  The palliative care team will continue to follow. Please call our office at [phone] if we can be of additional assistance.   Zabian Swayne Jenetta Downer, NP , DNP  This chart was dictated using voice recognition software. Despite best efforts to proofread, errors can occur which can change the documentation meaning.   COVID-19 PATIENT SCREENING TOOL Asked and negative response unless otherwise noted:   Have you had symptoms of covid, tested positive or been in contact with someone with symptoms/positive test in the past 5-10 days?  Negative

## 2021-03-16 ENCOUNTER — Telehealth: Payer: Self-pay

## 2021-03-16 NOTE — Telephone Encounter (Signed)
03/16/21 @1030AM : Palliative care SW outreached patients son, Seth Thompson, per [facility] NP- A.Olena Heckle request to assess for needs.   Son, Seth Thompson, shared that he has been overwhelmed with trying to assist patient as patients dementia is progressing, however patient is still mobile and determined to things on his. Son shared he has begun  assisting patient with paying bills due to patient forgetting to pay them. Son shares that patients cooking is questionable as he sometime forgets he has food on the stove and water running. Son shares that patient is non compliant with medications. Patient is still driving short distances to familiar places, however son shared that patient has had recent incidents where he forgot where he parked and had to have someone bring him home. Son shared that patient has not been going to doctors appointments either due to not answering the phone when doctor offices call and just not showing up to appointments. Son shared that patients other son currently lives with him, but has a dx of schizophrenia. Son, Seth Thompson, share that he visits patient and other son weekly to check on them (take grocery shopping, med management, etc). Son, Seth Thompson, is his brothers rep payee as well as his sisters guardian who is disabled and currently in the hospital; waiting to be placed into a facility.    SW and son discussed long term planning for patient. SW advised son that he is asking all of the rights questions and doing what he can to support patient, as much as patient will allow. Son declined wanting to file for POA or guardianship for his dad as he is dealing with a lot with his brother and sister care at this time. SW advised son to make an appointment with PCP for patient to discuss driving restrictions and to have a competency eval done. SW provided son with PCP office number to set up appointment. Son shared that patient was in the [place] in the 1960's. SW explored the option of VA benefits and outreaching the  VA to inquire of any assistance patient may be eligible for, so shared that he will look in to this. At this time patient is skeptical and un willing to have anyone come into his home to provide assistance to him.SW will make [facility]'s referral.  SW provided active listening and support to son during call and advised son to outreach should he have any additional questions/concerns.

## 2021-03-19 ENCOUNTER — Telehealth: Payer: Self-pay | Admitting: Adult Health Nurse Practitioner

## 2021-03-19 NOTE — Telephone Encounter (Signed)
Spoke with son and scheduled appointment for 03/21/21 @ 3pm Seth Bhargava K. Olena Heckle NP

## 2021-03-21 ENCOUNTER — Other Ambulatory Visit: Payer: PPO | Admitting: Adult Health Nurse Practitioner

## 2021-03-21 ENCOUNTER — Encounter: Payer: Self-pay | Admitting: Adult Health Nurse Practitioner

## 2021-03-21 ENCOUNTER — Other Ambulatory Visit: Payer: Self-pay

## 2021-03-21 DIAGNOSIS — F03A0 Unspecified dementia, mild, without behavioral disturbance, psychotic disturbance, mood disturbance, and anxiety: Secondary | ICD-10-CM

## 2021-03-21 DIAGNOSIS — Z515 Encounter for palliative care: Secondary | ICD-10-CM | POA: Diagnosis not present

## 2021-03-21 DIAGNOSIS — F0390 Unspecified dementia without behavioral disturbance: Secondary | ICD-10-CM

## 2021-03-21 NOTE — Progress Notes (Signed)
[facility] Palliative Care Consult Note Telephone: [phone]  Fax: [phone]    Date of encounter: 03/21/21 PATIENT NAME: Seth Thompson [place] [address]   [phone] (home)  DOB: 27-Sep-1946 MRN: [id number] PRIMARY CARE PROVIDER:    Dr. [place] Pink  REFERRING PROVIDER:   Dr. [place] Pink  RESPONSIBLE PARTY:    Contact Information     Name Relation Home Work Seth Thompson, Seth Thompson Son   [phone]   Seth Thompson, Seth Thompson [phone]          I met face to face with patient and family in home. Palliative Care was asked to follow this patient by consultation request of  Dr. [place] Pink to address advance care planning and complex medical decision making. This is a follow up visit.  Sons present during visit today                                  ASSESSMENT AND PLAN / RECOMMENDATIONS:   Advance Care Planning/Goals of Care: Goals include to maximize quality of life and symptom management.  CODE STATUS: Full code  Symptom Management/Plan:  Dementia: Patient is having more forgetfulness.  Discussed at length with sons and with the patient in the room concerns for him living alone without help in the home.  Discussed possible placement of patient in ALF in the future.  This will be an ongoing discussion social worker has contacted the patient's son and discussed power of attorney financial and healthcare and discussed competency issues.  Social worker did encourage son to set up an appointment with PCP for his father and to discuss competency issues and testing with him.  Social worker did not have any concerns for having APS involvement at this time.   Follow up Palliative Care Visit: Palliative care will continue to follow for complex medical decision making, advance care planning, and clarification of goals. Will have office call in about 4 weeks to set up palliative visit.  Encouraged to call with any questions or  concerns  I spent 60 minutes providing this consultation. More than 50% of the time in this consultation was spent in counseling and care coordination.   PPS: 60%  HOSPICE ELIGIBILITY/DIAGNOSIS: TBD  Chief Complaint: follow up palliative visit  HISTORY OF PRESENT ILLNESS:  Seth Thompson is a 75 y.o. year old male  with dementia, bradycardia, HLD, palpitations, frequent PVCs, HTN, BPH, inguinal hernia, history of diverticulitis and gallstones .  Patient has no new concerns today.  States that he is feeling fine.  HPI/ROS limited due to dementia.  Patient does seem nervous with provider in the home.  Patient is able to hold conversation but repeats himself frequently.  Currently son lives with him but is thinking of moving to [place] with his brother.  Son does help him with some meal prep and housecleaning.  Patient's other son comes down and stays with him for 1 day each week.  Patient is able to walk independently without assistive devices, ride a bicycle, he still drives but is usually accompanied by his son.  History obtained from review of EMR and interview with family and Seth Thompson.     Physical Exam   Constitutional: NAD General: WNWD EYES: anicteric sclera, lids intact, no discharge ENMT: intact hearing, oral mucous membranes moist CV: no LE edema Pulmonary: no increased work of breathing, no cough MSK: moves all  extremities, ambulatory without assistive devices Skin:  no rashes on visible skin Neuro:  A and O to person and place Hem/lymph/immuno: no widespread bruising   Thank you for the opportunity to participate in the care of Seth Thompson.  The palliative care team will continue to follow. Please call our office at [phone] if we can be of additional assistance.   Seth Thompson Jenetta Downer, NP , DNP  This chart was dictated using voice recognition software.  Despite best efforts to proofread,  errors can occur which can change the documentation meaning.   COVID-19  PATIENT SCREENING TOOL Asked and negative response unless otherwise noted:   Have you had symptoms of covid, tested positive or been in contact with someone with symptoms/positive test in the past 5-10 days?

## 2021-05-01 ENCOUNTER — Telehealth: Payer: Self-pay | Admitting: Student

## 2021-05-01 NOTE — Telephone Encounter (Signed)
Palliative NP returned call to patient's APS case worker Magda Paganini. She provided update on patient, decline in cognition. Patient is needing placement soon and they are working on process. NP will reach out to son to arrange palliative medicine follow up visit.

## 2021-05-09 DIAGNOSIS — H5789 Other specified disorders of eye and adnexa: Secondary | ICD-10-CM | POA: Diagnosis not present

## 2021-05-14 ENCOUNTER — Other Ambulatory Visit: Payer: Self-pay

## 2021-05-14 ENCOUNTER — Other Ambulatory Visit: Payer: PPO | Admitting: Student

## 2021-05-14 DIAGNOSIS — F0390 Unspecified dementia without behavioral disturbance: Secondary | ICD-10-CM | POA: Diagnosis not present

## 2021-05-14 DIAGNOSIS — Z515 Encounter for palliative care: Secondary | ICD-10-CM | POA: Diagnosis not present

## 2021-05-14 NOTE — Progress Notes (Signed)
[facility] Palliative [facility] Consult Note Telephone: [phone]  Fax: [phone]    Date of encounter: 05/14/21 PATIENT NAME: Seth Thompson [place] [address]   [phone] (home)  DOB: 1946-03-02 MRN: [id number] PRIMARY CARE PROVIDER:    [name],  [place] [address] [phone]  REFERRING PROVIDER:   [name] [address] [place],  [place] [address] [phone]  RESPONSIBLE PARTY:    Contact Information     Name Relation Home Work [address]   Charl[place], Boule Son   [phone]   Seth Thompson, Seth Thompson [phone]          I met face to face with patient and family in the home. APS case worker Seth Thompson also present. Palliative Care was asked to follow this patient by consultation request of  [facility], Seth Thompson to address advance care planning and complex medical decision making. This is a follow up visit.                                   ASSESSMENT AND PLAN / RECOMMENDATIONS:   Advance Care Planning/Goals of Care: Goals include to maximize quality of life and symptom management.   CODE STATUS: Full Code  Symptom Management/Plan:  Dementia-patient with worsening cognition. Family is working on placement. Discussed Exelon patch as an option for his dementia as patient does not take medications and family has difficulty getting patient to take medications. Family encouraged to redirect/reorient as needed, monitor for falls/safety. Family encouraged to take car keys to help prevent patient from driving. Recommend private caregiver to assist with patient care needs until patient transitions to facility. Recommend UA, CBC, CMP, TSH to be drawn. PCP to be notified.   Follow up Palliative Care Visit: Palliative care will continue to follow for complex medical decision making, advance care planning, and clarification of goals. Return in 6-8 weeks or prn.  I spent 60 minutes  providing this consultation. More than 50% of the time in this consultation was spent in counseling and care coordination.   PPS: 60%  HOSPICE ELIGIBILITY/DIAGNOSIS: TBD  Chief Complaint: Palliative Medicine follow up visit.   HISTORY OF PRESENT ILLNESS:  Seth Thompson is a 75 y.o. year old male  with dementia, hyperlipidemia, hypertension, bradycardia.   Patient resides at home; his son Seth Thompson is also in the home and son Seth Thompson is involved. APS case worker Seth Thompson is present. Seth Thompson reports marked cognitive decline in past month; states it was noticeable once his daughter Seth Thompson, that he had been caring for went into hospital and now a facility in [place]. Sons report patient is physically functional although he hasn't taken a full bath in months, takes a sponge bath. Weight loss noted in the past year; appears thinner. Meals on Wheels set up. Patient still drives. Family had disabled one vehicle and then he later found keys to his truck, which he occasionally drives. Patient has not taken any medication for some time. Patient will leave ad lib and family has been unsuccessful with redirecting him as patient becomes agitated.  Family is seeking guardianship and working on placement. Patient seen by PCP last week for redness to right eye; redness has improved.   Patient, sons and APS worker received outside of the home. They are looking for an envelope from where patient had went to bank earlier and misplaced the money. He was uncertain of  how much he had withdrawn. Patient able to answer some direct questions, difficulty with word finding noted. He is able to follow simple commands.   History obtained from review of EMR, discussion with primary team, and interview with family, facility staff/caregiver and/or Seth Thompson.  I reviewed available labs, medications, imaging, studies and related documents from the EMR.  Records reviewed and summarized above.   ROS  General: NAD EYES: denies vision  changes ENMT: denies dysphagia Cardiovascular: denies chest pain Pulmonary: denies cough, denies increased SOB Abdomen: endorses good appetite, denies constipation GU: denies dysuria, endorses continence of urine MSK:  denies weakness,  no falls reported Skin: denies rashes or wounds Neurological: denies pain, denies insomnia Psych: Endorses positive mood Heme/lymph/immuno: denies bruises, abnormal bleeding  Physical Exam: Pulse 60, resp 16, b/p 130/80, sats 98% on room air.  Constitutional: NAD, appropriately dressed, groomed General: frail appearing EYES: anicteric sclera, lids intact, no discharge, no redness  ENMT: intact hearing, oral mucous membranes moist, dentition intact CV: S1S2, RRR, no LE edema Pulmonary: LCTA, no increased work of breathing, no cough, room air Abdomen: normo-active BS + 4 quadrants, soft and non tender GU: deferred MSK: moves all extremities, ambulatory Skin: warm and dry, no rashes or wounds on visible skin Neuro:  no generalized weakness, A & O x 2, forgetful Psych: non-anxious affect, pleasant Hem/lymph/immuno: no widespread bruising   Thank you for the opportunity to participate in the care of Seth Thompson.  The palliative care team will continue to follow. Please call our office at [phone] if we can be of additional assistance.   Seth Slocumb, NP   COVID-19 PATIENT SCREENING TOOL Asked and negative response unless otherwise noted:   Have you had symptoms of covid, tested positive or been in contact with someone with symptoms/positive test in the past 5-10 days?  No

## 2021-05-16 ENCOUNTER — Telehealth: Payer: Self-pay | Admitting: Student

## 2021-05-16 NOTE — Telephone Encounter (Signed)
Palliative NP left message for son Seth Thompson regarding new script that PCP sent over to [facility] for Exelon patch. He is also made aware that lab orders are in and if he can call PCP to arrange appointment.

## 2021-07-30 DIAGNOSIS — F02B4 Dementia in other diseases classified elsewhere, moderate, with anxiety: Secondary | ICD-10-CM | POA: Diagnosis not present

## 2021-07-30 DIAGNOSIS — F419 Anxiety disorder, unspecified: Secondary | ICD-10-CM | POA: Diagnosis not present

## 2021-07-30 DIAGNOSIS — E7849 Other hyperlipidemia: Secondary | ICD-10-CM | POA: Diagnosis not present

## 2021-07-30 DIAGNOSIS — G301 Alzheimer's disease with late onset: Secondary | ICD-10-CM | POA: Diagnosis not present

## 2021-07-30 DIAGNOSIS — R030 Elevated blood-pressure reading, without diagnosis of hypertension: Secondary | ICD-10-CM | POA: Diagnosis not present

## 2021-07-31 DIAGNOSIS — F419 Anxiety disorder, unspecified: Secondary | ICD-10-CM | POA: Diagnosis not present

## 2021-08-01 DIAGNOSIS — I739 Peripheral vascular disease, unspecified: Secondary | ICD-10-CM | POA: Diagnosis not present

## 2021-08-01 DIAGNOSIS — L603 Nail dystrophy: Secondary | ICD-10-CM | POA: Diagnosis not present

## 2021-08-07 DIAGNOSIS — F419 Anxiety disorder, unspecified: Secondary | ICD-10-CM | POA: Diagnosis not present

## 2021-08-13 DIAGNOSIS — E539 Vitamin B deficiency, unspecified: Secondary | ICD-10-CM | POA: Diagnosis not present

## 2021-08-13 DIAGNOSIS — F02B4 Dementia in other diseases classified elsewhere, moderate, with anxiety: Secondary | ICD-10-CM | POA: Diagnosis not present

## 2021-08-13 DIAGNOSIS — G301 Alzheimer's disease with late onset: Secondary | ICD-10-CM | POA: Diagnosis not present

## 2021-08-13 DIAGNOSIS — F419 Anxiety disorder, unspecified: Secondary | ICD-10-CM | POA: Diagnosis not present

## 2021-08-14 DIAGNOSIS — F419 Anxiety disorder, unspecified: Secondary | ICD-10-CM | POA: Diagnosis not present

## 2021-08-21 DIAGNOSIS — F4321 Adjustment disorder with depressed mood: Secondary | ICD-10-CM | POA: Diagnosis not present

## 2021-08-21 DIAGNOSIS — F419 Anxiety disorder, unspecified: Secondary | ICD-10-CM | POA: Diagnosis not present

## 2021-08-21 DIAGNOSIS — G301 Alzheimer's disease with late onset: Secondary | ICD-10-CM | POA: Diagnosis not present

## 2021-08-21 DIAGNOSIS — F331 Major depressive disorder, recurrent, moderate: Secondary | ICD-10-CM | POA: Diagnosis not present

## 2021-08-21 DIAGNOSIS — E781 Pure hyperglyceridemia: Secondary | ICD-10-CM | POA: Diagnosis not present

## 2021-08-27 DIAGNOSIS — F02B4 Dementia in other diseases classified elsewhere, moderate, with anxiety: Secondary | ICD-10-CM | POA: Diagnosis not present

## 2021-08-27 DIAGNOSIS — R141 Gas pain: Secondary | ICD-10-CM | POA: Diagnosis not present

## 2021-08-27 DIAGNOSIS — G301 Alzheimer's disease with late onset: Secondary | ICD-10-CM | POA: Diagnosis not present

## 2021-08-27 DIAGNOSIS — F419 Anxiety disorder, unspecified: Secondary | ICD-10-CM | POA: Diagnosis not present

## 2021-08-28 DIAGNOSIS — F419 Anxiety disorder, unspecified: Secondary | ICD-10-CM | POA: Diagnosis not present

## 2021-08-28 DIAGNOSIS — F4321 Adjustment disorder with depressed mood: Secondary | ICD-10-CM | POA: Diagnosis not present

## 2021-09-04 DIAGNOSIS — F419 Anxiety disorder, unspecified: Secondary | ICD-10-CM | POA: Diagnosis not present

## 2021-09-04 DIAGNOSIS — F4321 Adjustment disorder with depressed mood: Secondary | ICD-10-CM | POA: Diagnosis not present

## 2021-09-06 DIAGNOSIS — R1084 Generalized abdominal pain: Secondary | ICD-10-CM | POA: Diagnosis not present

## 2021-09-06 DIAGNOSIS — K5900 Constipation, unspecified: Secondary | ICD-10-CM | POA: Diagnosis not present

## 2021-09-10 DIAGNOSIS — F4321 Adjustment disorder with depressed mood: Secondary | ICD-10-CM | POA: Diagnosis not present

## 2021-09-10 DIAGNOSIS — F331 Major depressive disorder, recurrent, moderate: Secondary | ICD-10-CM | POA: Diagnosis not present

## 2021-09-10 DIAGNOSIS — F02B4 Dementia in other diseases classified elsewhere, moderate, with anxiety: Secondary | ICD-10-CM | POA: Diagnosis not present

## 2021-09-10 DIAGNOSIS — G301 Alzheimer's disease with late onset: Secondary | ICD-10-CM | POA: Diagnosis not present

## 2021-09-11 DIAGNOSIS — F419 Anxiety disorder, unspecified: Secondary | ICD-10-CM | POA: Diagnosis not present

## 2021-09-11 DIAGNOSIS — F339 Major depressive disorder, recurrent, unspecified: Secondary | ICD-10-CM | POA: Diagnosis not present

## 2021-09-17 DIAGNOSIS — K5900 Constipation, unspecified: Secondary | ICD-10-CM | POA: Diagnosis not present

## 2021-09-17 DIAGNOSIS — R109 Unspecified abdominal pain: Secondary | ICD-10-CM | POA: Diagnosis not present

## 2021-09-17 DIAGNOSIS — R1084 Generalized abdominal pain: Secondary | ICD-10-CM | POA: Diagnosis not present

## 2021-09-18 DIAGNOSIS — F339 Major depressive disorder, recurrent, unspecified: Secondary | ICD-10-CM | POA: Diagnosis not present

## 2021-09-18 DIAGNOSIS — F419 Anxiety disorder, unspecified: Secondary | ICD-10-CM | POA: Diagnosis not present

## 2021-09-24 DIAGNOSIS — F419 Anxiety disorder, unspecified: Secondary | ICD-10-CM | POA: Diagnosis not present

## 2021-09-24 DIAGNOSIS — I739 Peripheral vascular disease, unspecified: Secondary | ICD-10-CM | POA: Diagnosis not present

## 2021-09-24 DIAGNOSIS — R1084 Generalized abdominal pain: Secondary | ICD-10-CM | POA: Diagnosis not present

## 2021-09-24 DIAGNOSIS — K5900 Constipation, unspecified: Secondary | ICD-10-CM | POA: Diagnosis not present

## 2021-09-25 DIAGNOSIS — F339 Major depressive disorder, recurrent, unspecified: Secondary | ICD-10-CM | POA: Diagnosis not present

## 2021-09-25 DIAGNOSIS — F419 Anxiety disorder, unspecified: Secondary | ICD-10-CM | POA: Diagnosis not present

## 2021-09-28 DIAGNOSIS — F339 Major depressive disorder, recurrent, unspecified: Secondary | ICD-10-CM | POA: Diagnosis not present

## 2021-09-28 DIAGNOSIS — E781 Pure hyperglyceridemia: Secondary | ICD-10-CM | POA: Diagnosis not present

## 2021-10-02 DIAGNOSIS — F419 Anxiety disorder, unspecified: Secondary | ICD-10-CM | POA: Diagnosis not present

## 2021-10-16 DIAGNOSIS — F339 Major depressive disorder, recurrent, unspecified: Secondary | ICD-10-CM | POA: Diagnosis not present

## 2021-10-16 DIAGNOSIS — F419 Anxiety disorder, unspecified: Secondary | ICD-10-CM | POA: Diagnosis not present

## 2021-10-19 DIAGNOSIS — F331 Major depressive disorder, recurrent, moderate: Secondary | ICD-10-CM | POA: Diagnosis not present

## 2021-10-19 DIAGNOSIS — F4321 Adjustment disorder with depressed mood: Secondary | ICD-10-CM | POA: Diagnosis not present

## 2021-10-22 DIAGNOSIS — F331 Major depressive disorder, recurrent, moderate: Secondary | ICD-10-CM | POA: Diagnosis not present

## 2021-10-22 DIAGNOSIS — G301 Alzheimer's disease with late onset: Secondary | ICD-10-CM | POA: Diagnosis not present

## 2021-10-22 DIAGNOSIS — F02B4 Dementia in other diseases classified elsewhere, moderate, with anxiety: Secondary | ICD-10-CM | POA: Diagnosis not present

## 2021-10-23 DIAGNOSIS — F4321 Adjustment disorder with depressed mood: Secondary | ICD-10-CM | POA: Diagnosis not present

## 2021-10-30 DIAGNOSIS — F4321 Adjustment disorder with depressed mood: Secondary | ICD-10-CM | POA: Diagnosis not present

## 2021-11-06 DIAGNOSIS — F4321 Adjustment disorder with depressed mood: Secondary | ICD-10-CM | POA: Diagnosis not present

## 2021-11-12 DIAGNOSIS — F419 Anxiety disorder, unspecified: Secondary | ICD-10-CM | POA: Diagnosis not present

## 2021-11-12 DIAGNOSIS — K5900 Constipation, unspecified: Secondary | ICD-10-CM | POA: Diagnosis not present

## 2021-11-12 DIAGNOSIS — E7849 Other hyperlipidemia: Secondary | ICD-10-CM | POA: Diagnosis not present

## 2021-11-12 DIAGNOSIS — I739 Peripheral vascular disease, unspecified: Secondary | ICD-10-CM | POA: Diagnosis not present

## 2021-11-12 DIAGNOSIS — B351 Tinea unguium: Secondary | ICD-10-CM | POA: Diagnosis not present

## 2021-11-13 DIAGNOSIS — G301 Alzheimer's disease with late onset: Secondary | ICD-10-CM | POA: Diagnosis not present

## 2021-11-13 DIAGNOSIS — J309 Allergic rhinitis, unspecified: Secondary | ICD-10-CM | POA: Diagnosis not present

## 2021-11-13 DIAGNOSIS — K5901 Slow transit constipation: Secondary | ICD-10-CM | POA: Diagnosis not present

## 2021-11-13 DIAGNOSIS — E8881 Metabolic syndrome: Secondary | ICD-10-CM | POA: Diagnosis not present

## 2021-11-13 DIAGNOSIS — F0280 Dementia in other diseases classified elsewhere without behavioral disturbance: Secondary | ICD-10-CM | POA: Diagnosis not present

## 2021-11-13 DIAGNOSIS — F331 Major depressive disorder, recurrent, moderate: Secondary | ICD-10-CM | POA: Diagnosis not present

## 2021-11-20 ENCOUNTER — Telehealth: Payer: Self-pay

## 2021-11-20 DIAGNOSIS — F419 Anxiety disorder, unspecified: Secondary | ICD-10-CM | POA: Diagnosis not present

## 2021-11-20 NOTE — Telephone Encounter (Signed)
745 am. Phone call made to [facility] to confirm patient is now a resident there. Spoke with Colletta Maryland who confirms patient now resides at this facility.  Charlann Boxer, NP update on location change.

## 2021-11-27 DIAGNOSIS — F419 Anxiety disorder, unspecified: Secondary | ICD-10-CM | POA: Diagnosis not present

## 2021-11-29 ENCOUNTER — Other Ambulatory Visit: Payer: Self-pay

## 2021-11-29 ENCOUNTER — Non-Acute Institutional Stay: Payer: PPO | Admitting: Student

## 2021-11-29 DIAGNOSIS — K5900 Constipation, unspecified: Secondary | ICD-10-CM

## 2021-11-29 DIAGNOSIS — Z515 Encounter for palliative care: Secondary | ICD-10-CM | POA: Diagnosis not present

## 2021-11-29 DIAGNOSIS — F0394 Unspecified dementia, unspecified severity, with anxiety: Secondary | ICD-10-CM | POA: Diagnosis not present

## 2021-11-29 NOTE — Progress Notes (Signed)
[facility] Consult Note Telephone: [phone]  Fax: [phone]    Date of encounter: 11/29/21  PATIENT NAME: Seth Thompson [place] Thompson [id number]   [phone] (home)  DOB: [birth year] MRN: [id number] PRIMARY CARE PROVIDER:    [facility]  REFERRING PROVIDER:   Virgie Dad, NP  RESPONSIBLE PARTY:    Contact Information     Name Relation Home Work [address]   Seth, Thompson Son   [phone]   Seth, Thompson [phone]          I met face to face with patient in the facility. Palliative Care was asked to follow this patient by consultation request of  Seth Dad, NP to address advance care planning and complex medical decision making. This is a follow up visit.                                   ASSESSMENT AND PLAN / RECOMMENDATIONS:   Advance Care Planning/Goals of Care: Goals include to maximize quality of life and symptom management. Patient/health care surrogate gave his/her permission to discuss. CODE STATUS: Full Code   Symptom Management/Plan:  Dementia-reorient and redirect as needed. Continue memantine 53m bid, donepezil 140mdaily. Staff to assist with adl's as needed. Monitor for falls/safety.   Anxiety-patient has adjusted well to move. Continue sertraline 25 mg daily; followed by psych.   Constipation-continue miralax 17 gm daily.   Follow up Palliative Care Visit: Palliative care will continue to follow for complex medical decision making, advance care planning, and clarification of goals. Return in 8 weeks or prn.  This visit was coded based on medical decision making (MDM).  PPS: 50%  HOSPICE ELIGIBILITY/DIAGNOSIS: TBD  Chief Complaint: Palliative Medicine follow up visit.   HISTORY OF PRESENT ILLNESS:  Seth Thompson a 89.o. year old male  with  with dementia, hyperlipidemia, hypertension, bradycardia.    [facility]. He denies pain or shortness of breath. No nausea; takes miralax for constipation. He endorses a good appetite. He is sleeping well. No recent falls reported.   Patient received sitting in common area. He has pleasant affect. No acute distress noted. He does have some difficulty with word finding. He is able to answer most direct questions.  A 10 point review of systems is negative, except for the pertinent positives and negatives detailed in the HPI.  History obtained from review of EMR, discussion with primary team, and interview with family, facility staff/caregiver and/or Seth Thompson I reviewed available labs, medications, imaging, studies and related documents from the EMR.  Records reviewed and summarized above.    Physical Exam: Pulse 56, resp 16, b/p 142/90, sats 97% on room air Constitutional: NAD General: frail appearing, WNWD EYES: anicteric sclera, lids intact, no discharge  ENMT: intact hearing, oral mucous membranes moist, dentition intact CV: S1S2, RRR, no LE edema Pulmonary: LCTA, no increased work of breathing, no cough, room air Abdomen: normo-active BS + 4 quadrants, soft and non tender GU: deferred MSK: no sarcopenia, moves all extremities, ambulatory Skin: warm and dry, no rashes or wounds on visible skin Neuro:  no generalized weakness, A & O x2 ,forgetful Psych: non-anxious affect, pleasant Hem/lymph/immuno: no widespread bruising   Thank you for the opportunity to participate in the care of Seth Thompson The palliative  care team will continue to follow. Please call our office at [phone] if we can be of additional assistance.   Seth Slocumb, NP   COVID-19 PATIENT SCREENING TOOL Asked and negative response unless otherwise noted:   Have you had symptoms of covid, tested positive or been in contact with someone with symptoms/positive test in the past 5-10 days? No

## 2021-12-03 DIAGNOSIS — F331 Major depressive disorder, recurrent, moderate: Secondary | ICD-10-CM | POA: Diagnosis not present

## 2021-12-03 DIAGNOSIS — F0280 Dementia in other diseases classified elsewhere without behavioral disturbance: Secondary | ICD-10-CM | POA: Diagnosis not present

## 2021-12-03 DIAGNOSIS — G301 Alzheimer's disease with late onset: Secondary | ICD-10-CM | POA: Diagnosis not present

## 2021-12-04 DIAGNOSIS — F419 Anxiety disorder, unspecified: Secondary | ICD-10-CM | POA: Diagnosis not present

## 2021-12-10 DIAGNOSIS — E8881 Metabolic syndrome: Secondary | ICD-10-CM | POA: Diagnosis not present

## 2021-12-10 DIAGNOSIS — K5901 Slow transit constipation: Secondary | ICD-10-CM | POA: Diagnosis not present

## 2021-12-10 DIAGNOSIS — G301 Alzheimer's disease with late onset: Secondary | ICD-10-CM | POA: Diagnosis not present

## 2021-12-10 DIAGNOSIS — Z6828 Body mass index (BMI) 28.0-28.9, adult: Secondary | ICD-10-CM | POA: Diagnosis not present

## 2021-12-10 DIAGNOSIS — F0280 Dementia in other diseases classified elsewhere without behavioral disturbance: Secondary | ICD-10-CM | POA: Diagnosis not present

## 2021-12-10 DIAGNOSIS — J309 Allergic rhinitis, unspecified: Secondary | ICD-10-CM | POA: Diagnosis not present

## 2021-12-11 DIAGNOSIS — F419 Anxiety disorder, unspecified: Secondary | ICD-10-CM | POA: Diagnosis not present

## 2021-12-11 DIAGNOSIS — F4321 Adjustment disorder with depressed mood: Secondary | ICD-10-CM | POA: Diagnosis not present

## 2021-12-18 DIAGNOSIS — F419 Anxiety disorder, unspecified: Secondary | ICD-10-CM | POA: Diagnosis not present

## 2021-12-18 DIAGNOSIS — F4321 Adjustment disorder with depressed mood: Secondary | ICD-10-CM | POA: Diagnosis not present

## 2021-12-24 DIAGNOSIS — F0280 Dementia in other diseases classified elsewhere without behavioral disturbance: Secondary | ICD-10-CM | POA: Diagnosis not present

## 2021-12-24 DIAGNOSIS — G301 Alzheimer's disease with late onset: Secondary | ICD-10-CM | POA: Diagnosis not present

## 2021-12-24 DIAGNOSIS — F4321 Adjustment disorder with depressed mood: Secondary | ICD-10-CM | POA: Diagnosis not present

## 2021-12-24 DIAGNOSIS — F419 Anxiety disorder, unspecified: Secondary | ICD-10-CM | POA: Diagnosis not present

## 2021-12-25 DIAGNOSIS — F419 Anxiety disorder, unspecified: Secondary | ICD-10-CM | POA: Diagnosis not present

## 2022-01-01 DIAGNOSIS — F4321 Adjustment disorder with depressed mood: Secondary | ICD-10-CM | POA: Diagnosis not present

## 2022-01-04 DIAGNOSIS — F419 Anxiety disorder, unspecified: Secondary | ICD-10-CM | POA: Diagnosis not present

## 2022-01-04 DIAGNOSIS — F0390 Unspecified dementia without behavioral disturbance: Secondary | ICD-10-CM | POA: Diagnosis not present

## 2022-01-07 DIAGNOSIS — G301 Alzheimer's disease with late onset: Secondary | ICD-10-CM | POA: Diagnosis not present

## 2022-01-07 DIAGNOSIS — F0280 Dementia in other diseases classified elsewhere without behavioral disturbance: Secondary | ICD-10-CM | POA: Diagnosis not present

## 2022-01-07 DIAGNOSIS — F419 Anxiety disorder, unspecified: Secondary | ICD-10-CM | POA: Diagnosis not present

## 2022-01-07 DIAGNOSIS — K5901 Slow transit constipation: Secondary | ICD-10-CM | POA: Diagnosis not present

## 2022-01-08 DIAGNOSIS — F419 Anxiety disorder, unspecified: Secondary | ICD-10-CM | POA: Diagnosis not present

## 2022-01-15 DIAGNOSIS — F4321 Adjustment disorder with depressed mood: Secondary | ICD-10-CM | POA: Diagnosis not present

## 2022-01-15 DIAGNOSIS — F419 Anxiety disorder, unspecified: Secondary | ICD-10-CM | POA: Diagnosis not present

## 2022-01-22 DIAGNOSIS — F4321 Adjustment disorder with depressed mood: Secondary | ICD-10-CM | POA: Diagnosis not present

## 2022-01-25 ENCOUNTER — Non-Acute Institutional Stay: Payer: PPO | Admitting: Student

## 2022-01-25 DIAGNOSIS — K5900 Constipation, unspecified: Secondary | ICD-10-CM | POA: Diagnosis not present

## 2022-01-25 DIAGNOSIS — Z515 Encounter for palliative care: Secondary | ICD-10-CM | POA: Diagnosis not present

## 2022-01-25 DIAGNOSIS — F419 Anxiety disorder, unspecified: Secondary | ICD-10-CM | POA: Diagnosis not present

## 2022-01-25 DIAGNOSIS — F0394 Unspecified dementia, unspecified severity, with anxiety: Secondary | ICD-10-CM

## 2022-01-25 NOTE — Progress Notes (Signed)
? ? ?Manufacturing [facility] ?[facility] Palliative [facility] Consult Note ?Telephone: [phone]  ?Fax: [phone]  ? ? ?Date of encounter: 01/25/22 ?12:35 PM ?PATIENT NAME: Seth Thompson ?[address] ?[place] [address]   [phone] (home)  ?DOB: [birth year] ?MRN: [id number] ?PRIMARY CARE PROVIDER:    ?Ellene [address],  ?[place] ?[place] [address] ?[phone] ? ?REFERRING PROVIDER:   ?Ellene [address] ?[address] ?[place] [address] ?[phone] ? ?RESPONSIBLE PARTY:    ?Contact Information   ? ? Name Relation Home Work Mobile  ? Adger, Cantera Son   [phone]  ? [facility] Son [phone]    ? ?  ? ? ? ?I met face to face with patient in the facility. Palliative Care was asked to follow this patient by consultation request of  [facility], Jefm Bryant to address advance care planning and complex medical decision making. This is a follow up visit. ? ?                                 ASSESSMENT AND PLAN / RECOMMENDATIONS:  ? ?Advance Care Planning/Goals of Care: Goals include to maximize quality of life and symptom management. Patient/health care surrogate gave his/her permission to discuss. ? ?CODE STATUS: Full Code ? ?Symptom Management/Plan: ? ?Dementia-continue memantine 10 mg daily, donepezil 5 mg QHS. Staff to reorient and redirect as needed. Assist with adl's as needed. Monitor for falls/safety.  ? ?Anxiety-continue sertraline 25 mg daily; continue to be followed by psychiatry.  ? ?Constipation-continue miralax 17 gm daily and senna routinely.  ? ?Follow up Palliative Care Visit: Palliative care will continue to follow for complex medical decision making, advance care planning, and clarification of goals. Return in 8-12 weeks or prn. ? ? ?This visit was coded based on medical decision making (MDM). ? ?PPS: 50% ? ?HOSPICE ELIGIBILITY/DIAGNOSIS: TBD ? ?Chief Complaint: Palliative Medicine follow up visit.  ? ?HISTORY OF PRESENT ILLNESS:  Seth Thompson is a 76 y.o. year old male  with dementia, hyperlipidemia, hypertension, bradycardia.    ? ?Patient now resides at [facility]. Patient doing well per facility staff. He has difficulty with word finding. He has pleasant affect, mood. He denies pain, shortness of breath, nausea. He receiving senna and miralax to help promote bowel movements. No sleep difficulty reported. No recent falls reported; ambulates ad lib about facility. No recent infections. A 10-point ROS is negative, except for the pertinent positives and negatives detailed per the HPI. ? ?History obtained from review of EMR, discussion with primary team, and interview with family, facility staff/caregiver and/or Seth Thompson.  ?I reviewed available labs, medications, imaging, studies and related documents from the EMR.  Records reviewed and summarized above.  ? ? ?Physical Exam: ?Pulse 66, resp 16, b/p 150/84, sats 96% on room air ?Constitutional: NAD ?General: frail appearing ?EYES: anicteric sclera, lids intact, no discharge  ?ENMT: intact hearing, oral mucous membranes moist, dentition intact ?CV: S1S2, RRR, no LE edema ?Pulmonary: LCTA, no increased work of breathing, no cough, room air ?Abdomen: normo-active BS + 4 quadrants, soft and non tender, no ascites ?GU: deferred ?MSK: no sarcopenia, moves all extremities, ambulatory ?Skin: warm and dry, no rashes or wounds on visible skin ?Neuro:  no generalized weakness, A & O to person, forgetful ?Psych: non-anxious affect, pleasant ?Hem/lymph/immuno: no widespread bruising ? ? ?Thank you for the opportunity to participate in the care of Seth Thompson.  The palliative care team will continue to follow. Please call our office at [phone] if we can be of additional assistance.  ? ?[name], NP  ? ?COVID-19 PATIENT SCREENING TOOL ?Asked and negative response unless otherwise noted:  ? ?Have you had symptoms of covid, tested positive or been in contact with someone with symptoms/positive  test in the past 5-10 days? No ? ? ?

## 2022-01-28 DIAGNOSIS — F0280 Dementia in other diseases classified elsewhere without behavioral disturbance: Secondary | ICD-10-CM | POA: Diagnosis not present

## 2022-01-28 DIAGNOSIS — G301 Alzheimer's disease with late onset: Secondary | ICD-10-CM | POA: Diagnosis not present

## 2022-01-28 DIAGNOSIS — F4321 Adjustment disorder with depressed mood: Secondary | ICD-10-CM | POA: Diagnosis not present

## 2022-01-29 DIAGNOSIS — F4321 Adjustment disorder with depressed mood: Secondary | ICD-10-CM | POA: Diagnosis not present

## 2022-02-01 DIAGNOSIS — F419 Anxiety disorder, unspecified: Secondary | ICD-10-CM | POA: Diagnosis not present

## 2022-02-01 DIAGNOSIS — E782 Mixed hyperlipidemia: Secondary | ICD-10-CM | POA: Diagnosis not present

## 2022-02-05 DIAGNOSIS — F4321 Adjustment disorder with depressed mood: Secondary | ICD-10-CM | POA: Diagnosis not present

## 2022-02-12 DIAGNOSIS — F4321 Adjustment disorder with depressed mood: Secondary | ICD-10-CM | POA: Diagnosis not present

## 2022-02-12 DIAGNOSIS — F419 Anxiety disorder, unspecified: Secondary | ICD-10-CM | POA: Diagnosis not present

## 2022-02-18 DIAGNOSIS — F0390 Unspecified dementia without behavioral disturbance: Secondary | ICD-10-CM | POA: Diagnosis not present

## 2022-02-18 DIAGNOSIS — G301 Alzheimer's disease with late onset: Secondary | ICD-10-CM | POA: Diagnosis not present

## 2022-02-18 DIAGNOSIS — F419 Anxiety disorder, unspecified: Secondary | ICD-10-CM | POA: Diagnosis not present

## 2022-02-18 DIAGNOSIS — F331 Major depressive disorder, recurrent, moderate: Secondary | ICD-10-CM | POA: Diagnosis not present

## 2022-02-18 DIAGNOSIS — G4700 Insomnia, unspecified: Secondary | ICD-10-CM | POA: Diagnosis not present

## 2022-02-19 DIAGNOSIS — F339 Major depressive disorder, recurrent, unspecified: Secondary | ICD-10-CM | POA: Diagnosis not present

## 2022-02-19 DIAGNOSIS — F419 Anxiety disorder, unspecified: Secondary | ICD-10-CM | POA: Diagnosis not present

## 2022-02-26 DIAGNOSIS — F331 Major depressive disorder, recurrent, moderate: Secondary | ICD-10-CM | POA: Diagnosis not present

## 2022-02-26 DIAGNOSIS — K5901 Slow transit constipation: Secondary | ICD-10-CM | POA: Diagnosis not present

## 2022-02-26 DIAGNOSIS — G301 Alzheimer's disease with late onset: Secondary | ICD-10-CM | POA: Diagnosis not present

## 2022-02-26 DIAGNOSIS — F339 Major depressive disorder, recurrent, unspecified: Secondary | ICD-10-CM | POA: Diagnosis not present

## 2022-02-26 DIAGNOSIS — F419 Anxiety disorder, unspecified: Secondary | ICD-10-CM | POA: Diagnosis not present

## 2022-02-26 DIAGNOSIS — E782 Mixed hyperlipidemia: Secondary | ICD-10-CM | POA: Diagnosis not present

## 2022-02-26 DIAGNOSIS — F0280 Dementia in other diseases classified elsewhere without behavioral disturbance: Secondary | ICD-10-CM | POA: Diagnosis not present

## 2022-03-04 DIAGNOSIS — G301 Alzheimer's disease with late onset: Secondary | ICD-10-CM | POA: Diagnosis not present

## 2022-03-04 DIAGNOSIS — F02B4 Dementia in other diseases classified elsewhere, moderate, with anxiety: Secondary | ICD-10-CM | POA: Diagnosis not present

## 2022-03-04 DIAGNOSIS — F419 Anxiety disorder, unspecified: Secondary | ICD-10-CM | POA: Diagnosis not present

## 2022-03-04 DIAGNOSIS — L249 Irritant contact dermatitis, unspecified cause: Secondary | ICD-10-CM | POA: Diagnosis not present

## 2022-03-04 DIAGNOSIS — E539 Vitamin B deficiency, unspecified: Secondary | ICD-10-CM | POA: Diagnosis not present

## 2022-03-04 DIAGNOSIS — N1831 Chronic kidney disease, stage 3a: Secondary | ICD-10-CM | POA: Diagnosis not present

## 2022-03-05 DIAGNOSIS — F419 Anxiety disorder, unspecified: Secondary | ICD-10-CM | POA: Diagnosis not present

## 2022-03-05 DIAGNOSIS — E782 Mixed hyperlipidemia: Secondary | ICD-10-CM | POA: Diagnosis not present

## 2022-03-12 DIAGNOSIS — F419 Anxiety disorder, unspecified: Secondary | ICD-10-CM | POA: Diagnosis not present

## 2022-03-12 DIAGNOSIS — F4320 Adjustment disorder, unspecified: Secondary | ICD-10-CM | POA: Diagnosis not present

## 2022-03-18 DIAGNOSIS — F331 Major depressive disorder, recurrent, moderate: Secondary | ICD-10-CM | POA: Diagnosis not present

## 2022-03-18 DIAGNOSIS — G301 Alzheimer's disease with late onset: Secondary | ICD-10-CM | POA: Diagnosis not present

## 2022-03-18 DIAGNOSIS — F02B4 Dementia in other diseases classified elsewhere, moderate, with anxiety: Secondary | ICD-10-CM | POA: Diagnosis not present

## 2022-03-19 DIAGNOSIS — F419 Anxiety disorder, unspecified: Secondary | ICD-10-CM | POA: Diagnosis not present

## 2022-03-19 DIAGNOSIS — F4320 Adjustment disorder, unspecified: Secondary | ICD-10-CM | POA: Diagnosis not present

## 2022-04-01 DIAGNOSIS — F419 Anxiety disorder, unspecified: Secondary | ICD-10-CM | POA: Diagnosis not present

## 2022-04-01 DIAGNOSIS — E663 Overweight: Secondary | ICD-10-CM | POA: Diagnosis not present

## 2022-04-01 DIAGNOSIS — F02B4 Dementia in other diseases classified elsewhere, moderate, with anxiety: Secondary | ICD-10-CM | POA: Diagnosis not present

## 2022-04-01 DIAGNOSIS — R269 Unspecified abnormalities of gait and mobility: Secondary | ICD-10-CM | POA: Diagnosis not present

## 2022-04-01 DIAGNOSIS — K5901 Slow transit constipation: Secondary | ICD-10-CM | POA: Diagnosis not present

## 2022-04-01 DIAGNOSIS — Z6828 Body mass index (BMI) 28.0-28.9, adult: Secondary | ICD-10-CM | POA: Diagnosis not present

## 2022-04-01 DIAGNOSIS — G301 Alzheimer's disease with late onset: Secondary | ICD-10-CM | POA: Diagnosis not present

## 2022-04-02 DIAGNOSIS — F4320 Adjustment disorder, unspecified: Secondary | ICD-10-CM | POA: Diagnosis not present

## 2022-04-03 DIAGNOSIS — E785 Hyperlipidemia, unspecified: Secondary | ICD-10-CM | POA: Diagnosis not present

## 2022-04-03 DIAGNOSIS — Z6830 Body mass index (BMI) 30.0-30.9, adult: Secondary | ICD-10-CM | POA: Diagnosis not present

## 2022-04-03 DIAGNOSIS — F0284 Dementia in other diseases classified elsewhere, unspecified severity, with anxiety: Secondary | ICD-10-CM | POA: Diagnosis not present

## 2022-04-03 DIAGNOSIS — G301 Alzheimer's disease with late onset: Secondary | ICD-10-CM | POA: Diagnosis not present

## 2022-04-03 DIAGNOSIS — N1831 Chronic kidney disease, stage 3a: Secondary | ICD-10-CM | POA: Diagnosis not present

## 2022-04-03 DIAGNOSIS — E663 Overweight: Secondary | ICD-10-CM | POA: Diagnosis not present

## 2022-04-03 DIAGNOSIS — L249 Irritant contact dermatitis, unspecified cause: Secondary | ICD-10-CM | POA: Diagnosis not present

## 2022-04-03 DIAGNOSIS — I129 Hypertensive chronic kidney disease with stage 1 through stage 4 chronic kidney disease, or unspecified chronic kidney disease: Secondary | ICD-10-CM | POA: Diagnosis not present

## 2022-04-03 DIAGNOSIS — E538 Deficiency of other specified B group vitamins: Secondary | ICD-10-CM | POA: Diagnosis not present

## 2022-04-03 DIAGNOSIS — E782 Mixed hyperlipidemia: Secondary | ICD-10-CM | POA: Diagnosis not present

## 2022-04-03 DIAGNOSIS — K5901 Slow transit constipation: Secondary | ICD-10-CM | POA: Diagnosis not present

## 2022-04-03 DIAGNOSIS — E8881 Metabolic syndrome: Secondary | ICD-10-CM | POA: Diagnosis not present

## 2022-04-12 DIAGNOSIS — G301 Alzheimer's disease with late onset: Secondary | ICD-10-CM | POA: Diagnosis not present

## 2022-04-12 DIAGNOSIS — F0284 Dementia in other diseases classified elsewhere, unspecified severity, with anxiety: Secondary | ICD-10-CM | POA: Diagnosis not present

## 2022-04-12 DIAGNOSIS — I129 Hypertensive chronic kidney disease with stage 1 through stage 4 chronic kidney disease, or unspecified chronic kidney disease: Secondary | ICD-10-CM | POA: Diagnosis not present

## 2022-04-15 DIAGNOSIS — F02B4 Dementia in other diseases classified elsewhere, moderate, with anxiety: Secondary | ICD-10-CM | POA: Diagnosis not present

## 2022-04-15 DIAGNOSIS — F419 Anxiety disorder, unspecified: Secondary | ICD-10-CM | POA: Diagnosis not present

## 2022-04-15 DIAGNOSIS — G301 Alzheimer's disease with late onset: Secondary | ICD-10-CM | POA: Diagnosis not present

## 2022-04-15 DIAGNOSIS — F331 Major depressive disorder, recurrent, moderate: Secondary | ICD-10-CM | POA: Diagnosis not present

## 2022-04-16 DIAGNOSIS — E663 Overweight: Secondary | ICD-10-CM | POA: Diagnosis not present

## 2022-04-16 DIAGNOSIS — E8881 Metabolic syndrome: Secondary | ICD-10-CM | POA: Diagnosis not present

## 2022-04-16 DIAGNOSIS — G301 Alzheimer's disease with late onset: Secondary | ICD-10-CM | POA: Diagnosis not present

## 2022-04-16 DIAGNOSIS — E785 Hyperlipidemia, unspecified: Secondary | ICD-10-CM | POA: Diagnosis not present

## 2022-04-16 DIAGNOSIS — N1831 Chronic kidney disease, stage 3a: Secondary | ICD-10-CM | POA: Diagnosis not present

## 2022-04-16 DIAGNOSIS — Z6830 Body mass index (BMI) 30.0-30.9, adult: Secondary | ICD-10-CM | POA: Diagnosis not present

## 2022-04-16 DIAGNOSIS — E538 Deficiency of other specified B group vitamins: Secondary | ICD-10-CM | POA: Diagnosis not present

## 2022-04-16 DIAGNOSIS — K5901 Slow transit constipation: Secondary | ICD-10-CM | POA: Diagnosis not present

## 2022-04-16 DIAGNOSIS — F0284 Dementia in other diseases classified elsewhere, unspecified severity, with anxiety: Secondary | ICD-10-CM | POA: Diagnosis not present

## 2022-04-16 DIAGNOSIS — L249 Irritant contact dermatitis, unspecified cause: Secondary | ICD-10-CM | POA: Diagnosis not present

## 2022-04-16 DIAGNOSIS — I129 Hypertensive chronic kidney disease with stage 1 through stage 4 chronic kidney disease, or unspecified chronic kidney disease: Secondary | ICD-10-CM | POA: Diagnosis not present

## 2022-04-26 DIAGNOSIS — E782 Mixed hyperlipidemia: Secondary | ICD-10-CM | POA: Diagnosis not present

## 2022-04-26 DIAGNOSIS — I129 Hypertensive chronic kidney disease with stage 1 through stage 4 chronic kidney disease, or unspecified chronic kidney disease: Secondary | ICD-10-CM | POA: Diagnosis not present

## 2022-04-29 DIAGNOSIS — R051 Acute cough: Secondary | ICD-10-CM | POA: Diagnosis not present

## 2022-04-29 DIAGNOSIS — F419 Anxiety disorder, unspecified: Secondary | ICD-10-CM | POA: Diagnosis not present

## 2022-04-29 DIAGNOSIS — R82998 Other abnormal findings in urine: Secondary | ICD-10-CM | POA: Diagnosis not present

## 2022-04-29 DIAGNOSIS — K929 Disease of digestive system, unspecified: Secondary | ICD-10-CM | POA: Diagnosis not present

## 2022-04-29 DIAGNOSIS — F02B4 Dementia in other diseases classified elsewhere, moderate, with anxiety: Secondary | ICD-10-CM | POA: Diagnosis not present

## 2022-04-29 DIAGNOSIS — G301 Alzheimer's disease with late onset: Secondary | ICD-10-CM | POA: Diagnosis not present

## 2022-05-10 DIAGNOSIS — L249 Irritant contact dermatitis, unspecified cause: Secondary | ICD-10-CM | POA: Diagnosis not present

## 2022-05-10 DIAGNOSIS — F0284 Dementia in other diseases classified elsewhere, unspecified severity, with anxiety: Secondary | ICD-10-CM | POA: Diagnosis not present

## 2022-05-10 DIAGNOSIS — Z6830 Body mass index (BMI) 30.0-30.9, adult: Secondary | ICD-10-CM | POA: Diagnosis not present

## 2022-05-10 DIAGNOSIS — E8881 Metabolic syndrome: Secondary | ICD-10-CM | POA: Diagnosis not present

## 2022-05-10 DIAGNOSIS — K5901 Slow transit constipation: Secondary | ICD-10-CM | POA: Diagnosis not present

## 2022-05-10 DIAGNOSIS — N1831 Chronic kidney disease, stage 3a: Secondary | ICD-10-CM | POA: Diagnosis not present

## 2022-05-10 DIAGNOSIS — I129 Hypertensive chronic kidney disease with stage 1 through stage 4 chronic kidney disease, or unspecified chronic kidney disease: Secondary | ICD-10-CM | POA: Diagnosis not present

## 2022-05-10 DIAGNOSIS — E538 Deficiency of other specified B group vitamins: Secondary | ICD-10-CM | POA: Diagnosis not present

## 2022-05-10 DIAGNOSIS — E663 Overweight: Secondary | ICD-10-CM | POA: Diagnosis not present

## 2022-05-10 DIAGNOSIS — G301 Alzheimer's disease with late onset: Secondary | ICD-10-CM | POA: Diagnosis not present

## 2022-05-10 DIAGNOSIS — E785 Hyperlipidemia, unspecified: Secondary | ICD-10-CM | POA: Diagnosis not present

## 2022-05-15 DIAGNOSIS — F0280 Dementia in other diseases classified elsewhere without behavioral disturbance: Secondary | ICD-10-CM | POA: Diagnosis not present

## 2022-05-15 DIAGNOSIS — E8881 Metabolic syndrome: Secondary | ICD-10-CM | POA: Diagnosis not present

## 2022-05-15 DIAGNOSIS — G301 Alzheimer's disease with late onset: Secondary | ICD-10-CM | POA: Diagnosis not present

## 2022-05-15 DIAGNOSIS — E538 Deficiency of other specified B group vitamins: Secondary | ICD-10-CM | POA: Diagnosis not present

## 2022-05-15 DIAGNOSIS — E782 Mixed hyperlipidemia: Secondary | ICD-10-CM | POA: Diagnosis not present

## 2022-05-20 DIAGNOSIS — F02B4 Dementia in other diseases classified elsewhere, moderate, with anxiety: Secondary | ICD-10-CM | POA: Diagnosis not present

## 2022-05-20 DIAGNOSIS — G301 Alzheimer's disease with late onset: Secondary | ICD-10-CM | POA: Diagnosis not present

## 2022-05-27 DIAGNOSIS — G301 Alzheimer's disease with late onset: Secondary | ICD-10-CM | POA: Diagnosis not present

## 2022-05-27 DIAGNOSIS — E782 Mixed hyperlipidemia: Secondary | ICD-10-CM | POA: Diagnosis not present

## 2022-05-27 DIAGNOSIS — F02B4 Dementia in other diseases classified elsewhere, moderate, with anxiety: Secondary | ICD-10-CM | POA: Diagnosis not present

## 2022-05-27 DIAGNOSIS — I739 Peripheral vascular disease, unspecified: Secondary | ICD-10-CM | POA: Diagnosis not present

## 2022-05-29 DIAGNOSIS — E663 Overweight: Secondary | ICD-10-CM | POA: Diagnosis not present

## 2022-05-29 DIAGNOSIS — R300 Dysuria: Secondary | ICD-10-CM | POA: Diagnosis not present

## 2022-05-29 DIAGNOSIS — Z6830 Body mass index (BMI) 30.0-30.9, adult: Secondary | ICD-10-CM | POA: Diagnosis not present

## 2022-05-29 DIAGNOSIS — E538 Deficiency of other specified B group vitamins: Secondary | ICD-10-CM | POA: Diagnosis not present

## 2022-05-29 DIAGNOSIS — F0284 Dementia in other diseases classified elsewhere, unspecified severity, with anxiety: Secondary | ICD-10-CM | POA: Diagnosis not present

## 2022-05-29 DIAGNOSIS — N1831 Chronic kidney disease, stage 3a: Secondary | ICD-10-CM | POA: Diagnosis not present

## 2022-05-29 DIAGNOSIS — K5901 Slow transit constipation: Secondary | ICD-10-CM | POA: Diagnosis not present

## 2022-05-29 DIAGNOSIS — I129 Hypertensive chronic kidney disease with stage 1 through stage 4 chronic kidney disease, or unspecified chronic kidney disease: Secondary | ICD-10-CM | POA: Diagnosis not present

## 2022-05-29 DIAGNOSIS — E785 Hyperlipidemia, unspecified: Secondary | ICD-10-CM | POA: Diagnosis not present

## 2022-05-29 DIAGNOSIS — G301 Alzheimer's disease with late onset: Secondary | ICD-10-CM | POA: Diagnosis not present

## 2022-05-29 DIAGNOSIS — L249 Irritant contact dermatitis, unspecified cause: Secondary | ICD-10-CM | POA: Diagnosis not present

## 2022-05-29 DIAGNOSIS — E8881 Metabolic syndrome: Secondary | ICD-10-CM | POA: Diagnosis not present

## 2022-06-03 DIAGNOSIS — N1831 Chronic kidney disease, stage 3a: Secondary | ICD-10-CM | POA: Diagnosis not present

## 2022-06-03 DIAGNOSIS — F0284 Dementia in other diseases classified elsewhere, unspecified severity, with anxiety: Secondary | ICD-10-CM | POA: Diagnosis not present

## 2022-06-03 DIAGNOSIS — E782 Mixed hyperlipidemia: Secondary | ICD-10-CM | POA: Diagnosis not present

## 2022-06-03 DIAGNOSIS — K5901 Slow transit constipation: Secondary | ICD-10-CM | POA: Diagnosis not present

## 2022-06-03 DIAGNOSIS — E8881 Metabolic syndrome: Secondary | ICD-10-CM | POA: Diagnosis not present

## 2022-06-03 DIAGNOSIS — G309 Alzheimer's disease, unspecified: Secondary | ICD-10-CM | POA: Diagnosis not present

## 2022-06-03 DIAGNOSIS — E538 Deficiency of other specified B group vitamins: Secondary | ICD-10-CM | POA: Diagnosis not present

## 2022-06-03 DIAGNOSIS — E663 Overweight: Secondary | ICD-10-CM | POA: Diagnosis not present

## 2022-06-03 DIAGNOSIS — L249 Irritant contact dermatitis, unspecified cause: Secondary | ICD-10-CM | POA: Diagnosis not present

## 2022-06-03 DIAGNOSIS — E785 Hyperlipidemia, unspecified: Secondary | ICD-10-CM | POA: Diagnosis not present

## 2022-06-03 DIAGNOSIS — I129 Hypertensive chronic kidney disease with stage 1 through stage 4 chronic kidney disease, or unspecified chronic kidney disease: Secondary | ICD-10-CM | POA: Diagnosis not present

## 2022-06-03 DIAGNOSIS — Z6830 Body mass index (BMI) 30.0-30.9, adult: Secondary | ICD-10-CM | POA: Diagnosis not present

## 2022-06-03 DIAGNOSIS — G301 Alzheimer's disease with late onset: Secondary | ICD-10-CM | POA: Diagnosis not present

## 2022-06-07 DIAGNOSIS — N1831 Chronic kidney disease, stage 3a: Secondary | ICD-10-CM | POA: Diagnosis not present

## 2022-06-07 DIAGNOSIS — G301 Alzheimer's disease with late onset: Secondary | ICD-10-CM | POA: Diagnosis not present

## 2022-06-07 DIAGNOSIS — E8881 Metabolic syndrome: Secondary | ICD-10-CM | POA: Diagnosis not present

## 2022-06-07 DIAGNOSIS — G309 Alzheimer's disease, unspecified: Secondary | ICD-10-CM | POA: Diagnosis not present

## 2022-06-07 DIAGNOSIS — E785 Hyperlipidemia, unspecified: Secondary | ICD-10-CM | POA: Diagnosis not present

## 2022-06-07 DIAGNOSIS — F0284 Dementia in other diseases classified elsewhere, unspecified severity, with anxiety: Secondary | ICD-10-CM | POA: Diagnosis not present

## 2022-06-07 DIAGNOSIS — L249 Irritant contact dermatitis, unspecified cause: Secondary | ICD-10-CM | POA: Diagnosis not present

## 2022-06-07 DIAGNOSIS — K5901 Slow transit constipation: Secondary | ICD-10-CM | POA: Diagnosis not present

## 2022-06-07 DIAGNOSIS — I129 Hypertensive chronic kidney disease with stage 1 through stage 4 chronic kidney disease, or unspecified chronic kidney disease: Secondary | ICD-10-CM | POA: Diagnosis not present

## 2022-06-07 DIAGNOSIS — Z6830 Body mass index (BMI) 30.0-30.9, adult: Secondary | ICD-10-CM | POA: Diagnosis not present

## 2022-06-07 DIAGNOSIS — E538 Deficiency of other specified B group vitamins: Secondary | ICD-10-CM | POA: Diagnosis not present

## 2022-06-07 DIAGNOSIS — E663 Overweight: Secondary | ICD-10-CM | POA: Diagnosis not present

## 2022-06-17 DIAGNOSIS — F02B4 Dementia in other diseases classified elsewhere, moderate, with anxiety: Secondary | ICD-10-CM | POA: Diagnosis not present

## 2022-06-17 DIAGNOSIS — Z6830 Body mass index (BMI) 30.0-30.9, adult: Secondary | ICD-10-CM | POA: Diagnosis not present

## 2022-06-17 DIAGNOSIS — E538 Deficiency of other specified B group vitamins: Secondary | ICD-10-CM | POA: Diagnosis not present

## 2022-06-17 DIAGNOSIS — G309 Alzheimer's disease, unspecified: Secondary | ICD-10-CM | POA: Diagnosis not present

## 2022-06-17 DIAGNOSIS — F0284 Dementia in other diseases classified elsewhere, unspecified severity, with anxiety: Secondary | ICD-10-CM | POA: Diagnosis not present

## 2022-06-17 DIAGNOSIS — F419 Anxiety disorder, unspecified: Secondary | ICD-10-CM | POA: Diagnosis not present

## 2022-06-17 DIAGNOSIS — E785 Hyperlipidemia, unspecified: Secondary | ICD-10-CM | POA: Diagnosis not present

## 2022-06-17 DIAGNOSIS — E663 Overweight: Secondary | ICD-10-CM | POA: Diagnosis not present

## 2022-06-17 DIAGNOSIS — N1831 Chronic kidney disease, stage 3a: Secondary | ICD-10-CM | POA: Diagnosis not present

## 2022-06-17 DIAGNOSIS — L249 Irritant contact dermatitis, unspecified cause: Secondary | ICD-10-CM | POA: Diagnosis not present

## 2022-06-17 DIAGNOSIS — G301 Alzheimer's disease with late onset: Secondary | ICD-10-CM | POA: Diagnosis not present

## 2022-06-17 DIAGNOSIS — I129 Hypertensive chronic kidney disease with stage 1 through stage 4 chronic kidney disease, or unspecified chronic kidney disease: Secondary | ICD-10-CM | POA: Diagnosis not present

## 2022-06-17 DIAGNOSIS — K5901 Slow transit constipation: Secondary | ICD-10-CM | POA: Diagnosis not present

## 2022-06-17 DIAGNOSIS — F331 Major depressive disorder, recurrent, moderate: Secondary | ICD-10-CM | POA: Diagnosis not present

## 2022-06-17 DIAGNOSIS — E8881 Metabolic syndrome: Secondary | ICD-10-CM | POA: Diagnosis not present

## 2022-06-19 DIAGNOSIS — L249 Irritant contact dermatitis, unspecified cause: Secondary | ICD-10-CM | POA: Diagnosis not present

## 2022-06-19 DIAGNOSIS — Z6830 Body mass index (BMI) 30.0-30.9, adult: Secondary | ICD-10-CM | POA: Diagnosis not present

## 2022-06-19 DIAGNOSIS — I129 Hypertensive chronic kidney disease with stage 1 through stage 4 chronic kidney disease, or unspecified chronic kidney disease: Secondary | ICD-10-CM | POA: Diagnosis not present

## 2022-06-19 DIAGNOSIS — E538 Deficiency of other specified B group vitamins: Secondary | ICD-10-CM | POA: Diagnosis not present

## 2022-06-19 DIAGNOSIS — G309 Alzheimer's disease, unspecified: Secondary | ICD-10-CM | POA: Diagnosis not present

## 2022-06-19 DIAGNOSIS — E663 Overweight: Secondary | ICD-10-CM | POA: Diagnosis not present

## 2022-06-19 DIAGNOSIS — K5901 Slow transit constipation: Secondary | ICD-10-CM | POA: Diagnosis not present

## 2022-06-19 DIAGNOSIS — F0284 Dementia in other diseases classified elsewhere, unspecified severity, with anxiety: Secondary | ICD-10-CM | POA: Diagnosis not present

## 2022-06-19 DIAGNOSIS — N1831 Chronic kidney disease, stage 3a: Secondary | ICD-10-CM | POA: Diagnosis not present

## 2022-06-19 DIAGNOSIS — E8881 Metabolic syndrome: Secondary | ICD-10-CM | POA: Diagnosis not present

## 2022-06-19 DIAGNOSIS — E785 Hyperlipidemia, unspecified: Secondary | ICD-10-CM | POA: Diagnosis not present

## 2022-06-19 DIAGNOSIS — G301 Alzheimer's disease with late onset: Secondary | ICD-10-CM | POA: Diagnosis not present

## 2022-06-20 DIAGNOSIS — F0284 Dementia in other diseases classified elsewhere, unspecified severity, with anxiety: Secondary | ICD-10-CM | POA: Diagnosis not present

## 2022-06-20 DIAGNOSIS — G301 Alzheimer's disease with late onset: Secondary | ICD-10-CM | POA: Diagnosis not present

## 2022-06-20 DIAGNOSIS — I129 Hypertensive chronic kidney disease with stage 1 through stage 4 chronic kidney disease, or unspecified chronic kidney disease: Secondary | ICD-10-CM | POA: Diagnosis not present

## 2022-06-24 DIAGNOSIS — E785 Hyperlipidemia, unspecified: Secondary | ICD-10-CM | POA: Diagnosis not present

## 2022-06-24 DIAGNOSIS — F0284 Dementia in other diseases classified elsewhere, unspecified severity, with anxiety: Secondary | ICD-10-CM | POA: Diagnosis not present

## 2022-06-24 DIAGNOSIS — G309 Alzheimer's disease, unspecified: Secondary | ICD-10-CM | POA: Diagnosis not present

## 2022-06-24 DIAGNOSIS — E538 Deficiency of other specified B group vitamins: Secondary | ICD-10-CM | POA: Diagnosis not present

## 2022-06-24 DIAGNOSIS — E8881 Metabolic syndrome: Secondary | ICD-10-CM | POA: Diagnosis not present

## 2022-06-24 DIAGNOSIS — K5901 Slow transit constipation: Secondary | ICD-10-CM | POA: Diagnosis not present

## 2022-06-24 DIAGNOSIS — Z6830 Body mass index (BMI) 30.0-30.9, adult: Secondary | ICD-10-CM | POA: Diagnosis not present

## 2022-06-24 DIAGNOSIS — F02B4 Dementia in other diseases classified elsewhere, moderate, with anxiety: Secondary | ICD-10-CM | POA: Diagnosis not present

## 2022-06-24 DIAGNOSIS — G301 Alzheimer's disease with late onset: Secondary | ICD-10-CM | POA: Diagnosis not present

## 2022-06-24 DIAGNOSIS — L249 Irritant contact dermatitis, unspecified cause: Secondary | ICD-10-CM | POA: Diagnosis not present

## 2022-06-24 DIAGNOSIS — F419 Anxiety disorder, unspecified: Secondary | ICD-10-CM | POA: Diagnosis not present

## 2022-06-24 DIAGNOSIS — N1831 Chronic kidney disease, stage 3a: Secondary | ICD-10-CM | POA: Diagnosis not present

## 2022-06-24 DIAGNOSIS — I129 Hypertensive chronic kidney disease with stage 1 through stage 4 chronic kidney disease, or unspecified chronic kidney disease: Secondary | ICD-10-CM | POA: Diagnosis not present

## 2022-06-24 DIAGNOSIS — E663 Overweight: Secondary | ICD-10-CM | POA: Diagnosis not present

## 2022-06-28 DIAGNOSIS — N1831 Chronic kidney disease, stage 3a: Secondary | ICD-10-CM | POA: Diagnosis not present

## 2022-06-28 DIAGNOSIS — K5901 Slow transit constipation: Secondary | ICD-10-CM | POA: Diagnosis not present

## 2022-06-28 DIAGNOSIS — E8881 Metabolic syndrome: Secondary | ICD-10-CM | POA: Diagnosis not present

## 2022-06-28 DIAGNOSIS — F0284 Dementia in other diseases classified elsewhere, unspecified severity, with anxiety: Secondary | ICD-10-CM | POA: Diagnosis not present

## 2022-06-28 DIAGNOSIS — G309 Alzheimer's disease, unspecified: Secondary | ICD-10-CM | POA: Diagnosis not present

## 2022-06-28 DIAGNOSIS — E663 Overweight: Secondary | ICD-10-CM | POA: Diagnosis not present

## 2022-06-28 DIAGNOSIS — Z6830 Body mass index (BMI) 30.0-30.9, adult: Secondary | ICD-10-CM | POA: Diagnosis not present

## 2022-06-28 DIAGNOSIS — G301 Alzheimer's disease with late onset: Secondary | ICD-10-CM | POA: Diagnosis not present

## 2022-06-28 DIAGNOSIS — E538 Deficiency of other specified B group vitamins: Secondary | ICD-10-CM | POA: Diagnosis not present

## 2022-06-28 DIAGNOSIS — L249 Irritant contact dermatitis, unspecified cause: Secondary | ICD-10-CM | POA: Diagnosis not present

## 2022-06-28 DIAGNOSIS — E785 Hyperlipidemia, unspecified: Secondary | ICD-10-CM | POA: Diagnosis not present

## 2022-06-28 DIAGNOSIS — I129 Hypertensive chronic kidney disease with stage 1 through stage 4 chronic kidney disease, or unspecified chronic kidney disease: Secondary | ICD-10-CM | POA: Diagnosis not present

## 2022-07-01 DIAGNOSIS — G309 Alzheimer's disease, unspecified: Secondary | ICD-10-CM | POA: Diagnosis not present

## 2022-07-01 DIAGNOSIS — L249 Irritant contact dermatitis, unspecified cause: Secondary | ICD-10-CM | POA: Diagnosis not present

## 2022-07-01 DIAGNOSIS — Z6830 Body mass index (BMI) 30.0-30.9, adult: Secondary | ICD-10-CM | POA: Diagnosis not present

## 2022-07-01 DIAGNOSIS — E785 Hyperlipidemia, unspecified: Secondary | ICD-10-CM | POA: Diagnosis not present

## 2022-07-01 DIAGNOSIS — I129 Hypertensive chronic kidney disease with stage 1 through stage 4 chronic kidney disease, or unspecified chronic kidney disease: Secondary | ICD-10-CM | POA: Diagnosis not present

## 2022-07-01 DIAGNOSIS — N1831 Chronic kidney disease, stage 3a: Secondary | ICD-10-CM | POA: Diagnosis not present

## 2022-07-01 DIAGNOSIS — E8881 Metabolic syndrome: Secondary | ICD-10-CM | POA: Diagnosis not present

## 2022-07-01 DIAGNOSIS — E782 Mixed hyperlipidemia: Secondary | ICD-10-CM | POA: Diagnosis not present

## 2022-07-01 DIAGNOSIS — G301 Alzheimer's disease with late onset: Secondary | ICD-10-CM | POA: Diagnosis not present

## 2022-07-01 DIAGNOSIS — E663 Overweight: Secondary | ICD-10-CM | POA: Diagnosis not present

## 2022-07-01 DIAGNOSIS — F0284 Dementia in other diseases classified elsewhere, unspecified severity, with anxiety: Secondary | ICD-10-CM | POA: Diagnosis not present

## 2022-07-01 DIAGNOSIS — K5901 Slow transit constipation: Secondary | ICD-10-CM | POA: Diagnosis not present

## 2022-07-01 DIAGNOSIS — E538 Deficiency of other specified B group vitamins: Secondary | ICD-10-CM | POA: Diagnosis not present

## 2022-07-05 ENCOUNTER — Non-Acute Institutional Stay: Payer: PPO | Admitting: Student

## 2022-07-05 DIAGNOSIS — F0394 Unspecified dementia, unspecified severity, with anxiety: Secondary | ICD-10-CM | POA: Diagnosis not present

## 2022-07-05 DIAGNOSIS — F419 Anxiety disorder, unspecified: Secondary | ICD-10-CM | POA: Diagnosis not present

## 2022-07-05 DIAGNOSIS — Z515 Encounter for palliative care: Secondary | ICD-10-CM

## 2022-07-05 DIAGNOSIS — F32A Depression, unspecified: Secondary | ICD-10-CM | POA: Diagnosis not present

## 2022-07-05 NOTE — Progress Notes (Signed)
Therapist, [facility] Palliative Care Consult Note Telephone: [phone]  Fax: [phone]    Date of encounter: 07/05/22  PATIENT NAME: Seth Thompson [address] [place] [address]   [phone] (home)  DOB: 09-10-1946 MRN: [id number] PRIMARY CARE PROVIDER:    Chapman Moss  REFERRING PROVIDER:   Chapman Moss  RESPONSIBLE PARTY:    Contact Information     Name Relation Home Work [address]   Romey, Mathieson Son   [phone]   CHANSE, KAGEL [phone]          I met face to face with patient in the facility. Palliative Care was asked to follow this patient by consultation request of   [facility] advance care planning and complex medical decision making. This is a follow up visit.                                   ASSESSMENT AND PLAN / RECOMMENDATIONS:   Advance Care Planning/Goals of Care: Goals include to maximize quality of life and symptom management. Patient/health care surrogate gave his/her permission to discuss.  CODE STATUS: Full Code  Palliative Medicine will monitor for changes/declines. Will provide supportive care, symptom management as needed.   Symptom Management/Plan:  Dementia-patient resides at [facility]. He has been stable per staff. Staff to reorient and redirect as needed. Assist with adl's as needed. Monitor for falls/safety. Continue donepezil and memantine as directed.   Anxiety and depression-continue sertraline as directed; continue to be followed by psychiatry.   Follow up Palliative Care Visit: Palliative care will continue to follow for complex medical decision making, advance care planning, and clarification of goals. Return in 8-10 weeks or prn.  This visit was coded based on medical decision making (MDM).  PPS: 50%  HOSPICE ELIGIBILITY/DIAGNOSIS: TBD  Chief Complaint: Palliative Medicine follow up visit.   HISTORY OF PRESENT ILLNESS:  Seth Thompson is a  75 y.o. year old male  with dementia, hyperlipidemia, hypertension, bradycardia.      Patient resides at [facility]. He states he is doing well. Staff report patient doing well; no recent falls, infections reported. Patient denies pain, shortness of breath, nausea, constipation. No sleep difficulty reported.   Patient received in his room after lunch. He has pleasant affect; cooperative. He does have difficulty with word finding. He denies any needs at present.   History obtained from review of EMR, discussion with primary team, and interview with family, facility staff/caregiver and/or [name].  I reviewed available labs, medications, imaging, studies and related documents from the EMR.  Records reviewed and summarized above.   ROS  A 10-point ROS is negative, except for the pertinent positives and negatives detailed per the HPI.  Physical Exam: Pulse 56, resp 18, b/p 148/80, sats 97% on room air Constitutional: NAD General: frail appearing EYES: anicteric sclera, lids intact, no discharge  ENMT: intact hearing, oral mucous membranes moist CV: S1S2, RRR, no LE edema Pulmonary: LCTA, no increased work of breathing, no cough Abdomen: normo-active BS + 4 quadrants, soft and non tender, no ascites GU: deferred MSK: no sarcopenia, ambulatory Skin: warm and dry, no rashes or wounds on visible skin Neuro:  no generalized weakness,  A & O to person Psych: non-anxious affect Hem/lymph/immuno: no widespread bruising   Thank you for the opportunity to participate in the care of [name].  The palliative care team will  continue to follow. Please call our office at [phone] if we can be of additional assistance.   [name], NP   COVID-19 PATIENT SCREENING TOOL Asked and negative response unless otherwise noted:   Have you had symptoms of covid, tested positive or been in contact with someone with symptoms/positive test in the past 5-10 days? No

## 2022-07-17 DIAGNOSIS — L249 Irritant contact dermatitis, unspecified cause: Secondary | ICD-10-CM | POA: Diagnosis not present

## 2022-07-17 DIAGNOSIS — F0284 Dementia in other diseases classified elsewhere, unspecified severity, with anxiety: Secondary | ICD-10-CM | POA: Diagnosis not present

## 2022-07-17 DIAGNOSIS — Z6830 Body mass index (BMI) 30.0-30.9, adult: Secondary | ICD-10-CM | POA: Diagnosis not present

## 2022-07-17 DIAGNOSIS — N1831 Chronic kidney disease, stage 3a: Secondary | ICD-10-CM | POA: Diagnosis not present

## 2022-07-17 DIAGNOSIS — E538 Deficiency of other specified B group vitamins: Secondary | ICD-10-CM | POA: Diagnosis not present

## 2022-07-17 DIAGNOSIS — I129 Hypertensive chronic kidney disease with stage 1 through stage 4 chronic kidney disease, or unspecified chronic kidney disease: Secondary | ICD-10-CM | POA: Diagnosis not present

## 2022-07-17 DIAGNOSIS — K5901 Slow transit constipation: Secondary | ICD-10-CM | POA: Diagnosis not present

## 2022-07-17 DIAGNOSIS — E8881 Metabolic syndrome: Secondary | ICD-10-CM | POA: Diagnosis not present

## 2022-07-17 DIAGNOSIS — G309 Alzheimer's disease, unspecified: Secondary | ICD-10-CM | POA: Diagnosis not present

## 2022-07-17 DIAGNOSIS — G301 Alzheimer's disease with late onset: Secondary | ICD-10-CM | POA: Diagnosis not present

## 2022-07-17 DIAGNOSIS — E663 Overweight: Secondary | ICD-10-CM | POA: Diagnosis not present

## 2022-07-17 DIAGNOSIS — E785 Hyperlipidemia, unspecified: Secondary | ICD-10-CM | POA: Diagnosis not present

## 2022-07-22 DIAGNOSIS — Z6828 Body mass index (BMI) 28.0-28.9, adult: Secondary | ICD-10-CM | POA: Diagnosis not present

## 2022-07-22 DIAGNOSIS — R269 Unspecified abnormalities of gait and mobility: Secondary | ICD-10-CM | POA: Diagnosis not present

## 2022-07-22 DIAGNOSIS — E663 Overweight: Secondary | ICD-10-CM | POA: Diagnosis not present

## 2022-07-22 DIAGNOSIS — K5901 Slow transit constipation: Secondary | ICD-10-CM | POA: Diagnosis not present

## 2022-07-23 DIAGNOSIS — G309 Alzheimer's disease, unspecified: Secondary | ICD-10-CM | POA: Diagnosis not present

## 2022-07-23 DIAGNOSIS — Z6830 Body mass index (BMI) 30.0-30.9, adult: Secondary | ICD-10-CM | POA: Diagnosis not present

## 2022-07-23 DIAGNOSIS — I129 Hypertensive chronic kidney disease with stage 1 through stage 4 chronic kidney disease, or unspecified chronic kidney disease: Secondary | ICD-10-CM | POA: Diagnosis not present

## 2022-07-23 DIAGNOSIS — K5901 Slow transit constipation: Secondary | ICD-10-CM | POA: Diagnosis not present

## 2022-07-23 DIAGNOSIS — G301 Alzheimer's disease with late onset: Secondary | ICD-10-CM | POA: Diagnosis not present

## 2022-07-23 DIAGNOSIS — E663 Overweight: Secondary | ICD-10-CM | POA: Diagnosis not present

## 2022-07-23 DIAGNOSIS — E8881 Metabolic syndrome: Secondary | ICD-10-CM | POA: Diagnosis not present

## 2022-07-23 DIAGNOSIS — F0284 Dementia in other diseases classified elsewhere, unspecified severity, with anxiety: Secondary | ICD-10-CM | POA: Diagnosis not present

## 2022-07-23 DIAGNOSIS — N1831 Chronic kidney disease, stage 3a: Secondary | ICD-10-CM | POA: Diagnosis not present

## 2022-07-23 DIAGNOSIS — E785 Hyperlipidemia, unspecified: Secondary | ICD-10-CM | POA: Diagnosis not present

## 2022-07-23 DIAGNOSIS — E538 Deficiency of other specified B group vitamins: Secondary | ICD-10-CM | POA: Diagnosis not present

## 2022-07-23 DIAGNOSIS — L249 Irritant contact dermatitis, unspecified cause: Secondary | ICD-10-CM | POA: Diagnosis not present

## 2022-07-29 DIAGNOSIS — E538 Deficiency of other specified B group vitamins: Secondary | ICD-10-CM | POA: Diagnosis not present

## 2022-07-29 DIAGNOSIS — K5901 Slow transit constipation: Secondary | ICD-10-CM | POA: Diagnosis not present

## 2022-07-29 DIAGNOSIS — I129 Hypertensive chronic kidney disease with stage 1 through stage 4 chronic kidney disease, or unspecified chronic kidney disease: Secondary | ICD-10-CM | POA: Diagnosis not present

## 2022-07-29 DIAGNOSIS — Z6830 Body mass index (BMI) 30.0-30.9, adult: Secondary | ICD-10-CM | POA: Diagnosis not present

## 2022-07-29 DIAGNOSIS — L249 Irritant contact dermatitis, unspecified cause: Secondary | ICD-10-CM | POA: Diagnosis not present

## 2022-07-29 DIAGNOSIS — E785 Hyperlipidemia, unspecified: Secondary | ICD-10-CM | POA: Diagnosis not present

## 2022-07-29 DIAGNOSIS — F0284 Dementia in other diseases classified elsewhere, unspecified severity, with anxiety: Secondary | ICD-10-CM | POA: Diagnosis not present

## 2022-07-29 DIAGNOSIS — F02B4 Dementia in other diseases classified elsewhere, moderate, with anxiety: Secondary | ICD-10-CM | POA: Diagnosis not present

## 2022-07-29 DIAGNOSIS — G309 Alzheimer's disease, unspecified: Secondary | ICD-10-CM | POA: Diagnosis not present

## 2022-07-29 DIAGNOSIS — E663 Overweight: Secondary | ICD-10-CM | POA: Diagnosis not present

## 2022-07-29 DIAGNOSIS — E8881 Metabolic syndrome: Secondary | ICD-10-CM | POA: Diagnosis not present

## 2022-07-29 DIAGNOSIS — F419 Anxiety disorder, unspecified: Secondary | ICD-10-CM | POA: Diagnosis not present

## 2022-07-29 DIAGNOSIS — F331 Major depressive disorder, recurrent, moderate: Secondary | ICD-10-CM | POA: Diagnosis not present

## 2022-07-29 DIAGNOSIS — N1831 Chronic kidney disease, stage 3a: Secondary | ICD-10-CM | POA: Diagnosis not present

## 2022-07-29 DIAGNOSIS — G301 Alzheimer's disease with late onset: Secondary | ICD-10-CM | POA: Diagnosis not present

## 2022-07-30 DIAGNOSIS — E663 Overweight: Secondary | ICD-10-CM | POA: Diagnosis not present

## 2022-07-30 DIAGNOSIS — G301 Alzheimer's disease with late onset: Secondary | ICD-10-CM | POA: Diagnosis not present

## 2022-07-30 DIAGNOSIS — E8881 Metabolic syndrome: Secondary | ICD-10-CM | POA: Diagnosis not present

## 2022-07-30 DIAGNOSIS — E538 Deficiency of other specified B group vitamins: Secondary | ICD-10-CM | POA: Diagnosis not present

## 2022-07-30 DIAGNOSIS — Z6830 Body mass index (BMI) 30.0-30.9, adult: Secondary | ICD-10-CM | POA: Diagnosis not present

## 2022-07-30 DIAGNOSIS — L249 Irritant contact dermatitis, unspecified cause: Secondary | ICD-10-CM | POA: Diagnosis not present

## 2022-07-30 DIAGNOSIS — K5901 Slow transit constipation: Secondary | ICD-10-CM | POA: Diagnosis not present

## 2022-07-30 DIAGNOSIS — E785 Hyperlipidemia, unspecified: Secondary | ICD-10-CM | POA: Diagnosis not present

## 2022-07-30 DIAGNOSIS — I129 Hypertensive chronic kidney disease with stage 1 through stage 4 chronic kidney disease, or unspecified chronic kidney disease: Secondary | ICD-10-CM | POA: Diagnosis not present

## 2022-07-30 DIAGNOSIS — F0284 Dementia in other diseases classified elsewhere, unspecified severity, with anxiety: Secondary | ICD-10-CM | POA: Diagnosis not present

## 2022-07-30 DIAGNOSIS — N1831 Chronic kidney disease, stage 3a: Secondary | ICD-10-CM | POA: Diagnosis not present

## 2022-07-30 DIAGNOSIS — G309 Alzheimer's disease, unspecified: Secondary | ICD-10-CM | POA: Diagnosis not present

## 2022-08-02 DIAGNOSIS — I7389 Other specified peripheral vascular diseases: Secondary | ICD-10-CM | POA: Diagnosis not present

## 2022-08-02 DIAGNOSIS — I129 Hypertensive chronic kidney disease with stage 1 through stage 4 chronic kidney disease, or unspecified chronic kidney disease: Secondary | ICD-10-CM | POA: Diagnosis not present

## 2022-08-05 DIAGNOSIS — E663 Overweight: Secondary | ICD-10-CM | POA: Diagnosis not present

## 2022-08-05 DIAGNOSIS — Z6830 Body mass index (BMI) 30.0-30.9, adult: Secondary | ICD-10-CM | POA: Diagnosis not present

## 2022-08-05 DIAGNOSIS — F0284 Dementia in other diseases classified elsewhere, unspecified severity, with anxiety: Secondary | ICD-10-CM | POA: Diagnosis not present

## 2022-08-05 DIAGNOSIS — E8881 Metabolic syndrome: Secondary | ICD-10-CM | POA: Diagnosis not present

## 2022-08-05 DIAGNOSIS — E785 Hyperlipidemia, unspecified: Secondary | ICD-10-CM | POA: Diagnosis not present

## 2022-08-05 DIAGNOSIS — K5901 Slow transit constipation: Secondary | ICD-10-CM | POA: Diagnosis not present

## 2022-08-05 DIAGNOSIS — G309 Alzheimer's disease, unspecified: Secondary | ICD-10-CM | POA: Diagnosis not present

## 2022-08-05 DIAGNOSIS — E538 Deficiency of other specified B group vitamins: Secondary | ICD-10-CM | POA: Diagnosis not present

## 2022-08-05 DIAGNOSIS — L249 Irritant contact dermatitis, unspecified cause: Secondary | ICD-10-CM | POA: Diagnosis not present

## 2022-08-05 DIAGNOSIS — G301 Alzheimer's disease with late onset: Secondary | ICD-10-CM | POA: Diagnosis not present

## 2022-08-05 DIAGNOSIS — N1831 Chronic kidney disease, stage 3a: Secondary | ICD-10-CM | POA: Diagnosis not present

## 2022-08-05 DIAGNOSIS — I129 Hypertensive chronic kidney disease with stage 1 through stage 4 chronic kidney disease, or unspecified chronic kidney disease: Secondary | ICD-10-CM | POA: Diagnosis not present

## 2022-08-06 DIAGNOSIS — F0284 Dementia in other diseases classified elsewhere, unspecified severity, with anxiety: Secondary | ICD-10-CM | POA: Diagnosis not present

## 2022-08-06 DIAGNOSIS — I129 Hypertensive chronic kidney disease with stage 1 through stage 4 chronic kidney disease, or unspecified chronic kidney disease: Secondary | ICD-10-CM | POA: Diagnosis not present

## 2022-08-06 DIAGNOSIS — G301 Alzheimer's disease with late onset: Secondary | ICD-10-CM | POA: Diagnosis not present

## 2022-08-08 DIAGNOSIS — Z6830 Body mass index (BMI) 30.0-30.9, adult: Secondary | ICD-10-CM | POA: Diagnosis not present

## 2022-08-08 DIAGNOSIS — G301 Alzheimer's disease with late onset: Secondary | ICD-10-CM | POA: Diagnosis not present

## 2022-08-08 DIAGNOSIS — F0284 Dementia in other diseases classified elsewhere, unspecified severity, with anxiety: Secondary | ICD-10-CM | POA: Diagnosis not present

## 2022-08-08 DIAGNOSIS — E785 Hyperlipidemia, unspecified: Secondary | ICD-10-CM | POA: Diagnosis not present

## 2022-08-08 DIAGNOSIS — L249 Irritant contact dermatitis, unspecified cause: Secondary | ICD-10-CM | POA: Diagnosis not present

## 2022-08-08 DIAGNOSIS — E538 Deficiency of other specified B group vitamins: Secondary | ICD-10-CM | POA: Diagnosis not present

## 2022-08-08 DIAGNOSIS — K5901 Slow transit constipation: Secondary | ICD-10-CM | POA: Diagnosis not present

## 2022-08-08 DIAGNOSIS — E663 Overweight: Secondary | ICD-10-CM | POA: Diagnosis not present

## 2022-08-08 DIAGNOSIS — G309 Alzheimer's disease, unspecified: Secondary | ICD-10-CM | POA: Diagnosis not present

## 2022-08-08 DIAGNOSIS — E8881 Metabolic syndrome: Secondary | ICD-10-CM | POA: Diagnosis not present

## 2022-08-08 DIAGNOSIS — I129 Hypertensive chronic kidney disease with stage 1 through stage 4 chronic kidney disease, or unspecified chronic kidney disease: Secondary | ICD-10-CM | POA: Diagnosis not present

## 2022-08-08 DIAGNOSIS — N1831 Chronic kidney disease, stage 3a: Secondary | ICD-10-CM | POA: Diagnosis not present

## 2022-08-14 DIAGNOSIS — I129 Hypertensive chronic kidney disease with stage 1 through stage 4 chronic kidney disease, or unspecified chronic kidney disease: Secondary | ICD-10-CM | POA: Diagnosis not present

## 2022-08-14 DIAGNOSIS — F0284 Dementia in other diseases classified elsewhere, unspecified severity, with anxiety: Secondary | ICD-10-CM | POA: Diagnosis not present

## 2022-08-14 DIAGNOSIS — N1831 Chronic kidney disease, stage 3a: Secondary | ICD-10-CM | POA: Diagnosis not present

## 2022-08-14 DIAGNOSIS — E785 Hyperlipidemia, unspecified: Secondary | ICD-10-CM | POA: Diagnosis not present

## 2022-08-14 DIAGNOSIS — K5901 Slow transit constipation: Secondary | ICD-10-CM | POA: Diagnosis not present

## 2022-08-14 DIAGNOSIS — L249 Irritant contact dermatitis, unspecified cause: Secondary | ICD-10-CM | POA: Diagnosis not present

## 2022-08-14 DIAGNOSIS — G301 Alzheimer's disease with late onset: Secondary | ICD-10-CM | POA: Diagnosis not present

## 2022-08-14 DIAGNOSIS — E538 Deficiency of other specified B group vitamins: Secondary | ICD-10-CM | POA: Diagnosis not present

## 2022-08-14 DIAGNOSIS — Z6830 Body mass index (BMI) 30.0-30.9, adult: Secondary | ICD-10-CM | POA: Diagnosis not present

## 2022-08-14 DIAGNOSIS — G309 Alzheimer's disease, unspecified: Secondary | ICD-10-CM | POA: Diagnosis not present

## 2022-08-14 DIAGNOSIS — E663 Overweight: Secondary | ICD-10-CM | POA: Diagnosis not present

## 2022-08-14 DIAGNOSIS — E8881 Metabolic syndrome: Secondary | ICD-10-CM | POA: Diagnosis not present

## 2022-08-15 DIAGNOSIS — L84 Corns and callosities: Secondary | ICD-10-CM | POA: Diagnosis not present

## 2022-08-15 DIAGNOSIS — L605 Yellow nail syndrome: Secondary | ICD-10-CM | POA: Diagnosis not present

## 2022-08-15 DIAGNOSIS — B351 Tinea unguium: Secondary | ICD-10-CM | POA: Diagnosis not present

## 2022-08-15 DIAGNOSIS — M79675 Pain in left toe(s): Secondary | ICD-10-CM | POA: Diagnosis not present

## 2022-08-15 DIAGNOSIS — M79674 Pain in right toe(s): Secondary | ICD-10-CM | POA: Diagnosis not present

## 2022-08-15 DIAGNOSIS — F0390 Unspecified dementia without behavioral disturbance: Secondary | ICD-10-CM | POA: Diagnosis not present

## 2022-08-15 DIAGNOSIS — R2681 Unsteadiness on feet: Secondary | ICD-10-CM | POA: Diagnosis not present

## 2022-08-16 DIAGNOSIS — I7389 Other specified peripheral vascular diseases: Secondary | ICD-10-CM | POA: Diagnosis not present

## 2022-08-16 DIAGNOSIS — E538 Deficiency of other specified B group vitamins: Secondary | ICD-10-CM | POA: Diagnosis not present

## 2022-08-16 DIAGNOSIS — E782 Mixed hyperlipidemia: Secondary | ICD-10-CM | POA: Diagnosis not present

## 2022-08-16 DIAGNOSIS — N1831 Chronic kidney disease, stage 3a: Secondary | ICD-10-CM | POA: Diagnosis not present

## 2022-08-16 DIAGNOSIS — G301 Alzheimer's disease with late onset: Secondary | ICD-10-CM | POA: Diagnosis not present

## 2022-08-16 DIAGNOSIS — I129 Hypertensive chronic kidney disease with stage 1 through stage 4 chronic kidney disease, or unspecified chronic kidney disease: Secondary | ICD-10-CM | POA: Diagnosis not present

## 2022-08-16 DIAGNOSIS — L249 Irritant contact dermatitis, unspecified cause: Secondary | ICD-10-CM | POA: Diagnosis not present

## 2022-08-16 DIAGNOSIS — K5901 Slow transit constipation: Secondary | ICD-10-CM | POA: Diagnosis not present

## 2022-08-16 DIAGNOSIS — K5904 Chronic idiopathic constipation: Secondary | ICD-10-CM | POA: Diagnosis not present

## 2022-08-16 DIAGNOSIS — F0284 Dementia in other diseases classified elsewhere, unspecified severity, with anxiety: Secondary | ICD-10-CM | POA: Diagnosis not present

## 2022-08-16 DIAGNOSIS — E785 Hyperlipidemia, unspecified: Secondary | ICD-10-CM | POA: Diagnosis not present

## 2022-08-16 DIAGNOSIS — Z6830 Body mass index (BMI) 30.0-30.9, adult: Secondary | ICD-10-CM | POA: Diagnosis not present

## 2022-08-16 DIAGNOSIS — R269 Unspecified abnormalities of gait and mobility: Secondary | ICD-10-CM | POA: Diagnosis not present

## 2022-08-16 DIAGNOSIS — E663 Overweight: Secondary | ICD-10-CM | POA: Diagnosis not present

## 2022-08-16 DIAGNOSIS — E8881 Metabolic syndrome: Secondary | ICD-10-CM | POA: Diagnosis not present

## 2022-08-16 DIAGNOSIS — G309 Alzheimer's disease, unspecified: Secondary | ICD-10-CM | POA: Diagnosis not present

## 2022-08-19 DIAGNOSIS — Z6830 Body mass index (BMI) 30.0-30.9, adult: Secondary | ICD-10-CM | POA: Diagnosis not present

## 2022-08-19 DIAGNOSIS — L249 Irritant contact dermatitis, unspecified cause: Secondary | ICD-10-CM | POA: Diagnosis not present

## 2022-08-19 DIAGNOSIS — G301 Alzheimer's disease with late onset: Secondary | ICD-10-CM | POA: Diagnosis not present

## 2022-08-19 DIAGNOSIS — K5901 Slow transit constipation: Secondary | ICD-10-CM | POA: Diagnosis not present

## 2022-08-19 DIAGNOSIS — E8881 Metabolic syndrome: Secondary | ICD-10-CM | POA: Diagnosis not present

## 2022-08-19 DIAGNOSIS — E538 Deficiency of other specified B group vitamins: Secondary | ICD-10-CM | POA: Diagnosis not present

## 2022-08-19 DIAGNOSIS — I129 Hypertensive chronic kidney disease with stage 1 through stage 4 chronic kidney disease, or unspecified chronic kidney disease: Secondary | ICD-10-CM | POA: Diagnosis not present

## 2022-08-19 DIAGNOSIS — F0284 Dementia in other diseases classified elsewhere, unspecified severity, with anxiety: Secondary | ICD-10-CM | POA: Diagnosis not present

## 2022-08-19 DIAGNOSIS — G309 Alzheimer's disease, unspecified: Secondary | ICD-10-CM | POA: Diagnosis not present

## 2022-08-19 DIAGNOSIS — E785 Hyperlipidemia, unspecified: Secondary | ICD-10-CM | POA: Diagnosis not present

## 2022-08-19 DIAGNOSIS — E663 Overweight: Secondary | ICD-10-CM | POA: Diagnosis not present

## 2022-08-19 DIAGNOSIS — N1831 Chronic kidney disease, stage 3a: Secondary | ICD-10-CM | POA: Diagnosis not present

## 2022-08-22 DIAGNOSIS — E663 Overweight: Secondary | ICD-10-CM | POA: Diagnosis not present

## 2022-08-22 DIAGNOSIS — E8881 Metabolic syndrome: Secondary | ICD-10-CM | POA: Diagnosis not present

## 2022-08-22 DIAGNOSIS — N1831 Chronic kidney disease, stage 3a: Secondary | ICD-10-CM | POA: Diagnosis not present

## 2022-08-22 DIAGNOSIS — G309 Alzheimer's disease, unspecified: Secondary | ICD-10-CM | POA: Diagnosis not present

## 2022-08-22 DIAGNOSIS — I129 Hypertensive chronic kidney disease with stage 1 through stage 4 chronic kidney disease, or unspecified chronic kidney disease: Secondary | ICD-10-CM | POA: Diagnosis not present

## 2022-08-22 DIAGNOSIS — Z6830 Body mass index (BMI) 30.0-30.9, adult: Secondary | ICD-10-CM | POA: Diagnosis not present

## 2022-08-22 DIAGNOSIS — L249 Irritant contact dermatitis, unspecified cause: Secondary | ICD-10-CM | POA: Diagnosis not present

## 2022-08-22 DIAGNOSIS — E785 Hyperlipidemia, unspecified: Secondary | ICD-10-CM | POA: Diagnosis not present

## 2022-08-22 DIAGNOSIS — K5901 Slow transit constipation: Secondary | ICD-10-CM | POA: Diagnosis not present

## 2022-08-22 DIAGNOSIS — G301 Alzheimer's disease with late onset: Secondary | ICD-10-CM | POA: Diagnosis not present

## 2022-08-22 DIAGNOSIS — E538 Deficiency of other specified B group vitamins: Secondary | ICD-10-CM | POA: Diagnosis not present

## 2022-08-22 DIAGNOSIS — F0284 Dementia in other diseases classified elsewhere, unspecified severity, with anxiety: Secondary | ICD-10-CM | POA: Diagnosis not present

## 2022-08-26 DIAGNOSIS — F419 Anxiety disorder, unspecified: Secondary | ICD-10-CM | POA: Diagnosis not present

## 2022-08-26 DIAGNOSIS — F02B4 Dementia in other diseases classified elsewhere, moderate, with anxiety: Secondary | ICD-10-CM | POA: Diagnosis not present

## 2022-08-26 DIAGNOSIS — F331 Major depressive disorder, recurrent, moderate: Secondary | ICD-10-CM | POA: Diagnosis not present

## 2022-08-26 DIAGNOSIS — G301 Alzheimer's disease with late onset: Secondary | ICD-10-CM | POA: Diagnosis not present

## 2022-09-02 DIAGNOSIS — I7389 Other specified peripheral vascular diseases: Secondary | ICD-10-CM | POA: Diagnosis not present

## 2022-09-02 DIAGNOSIS — L249 Irritant contact dermatitis, unspecified cause: Secondary | ICD-10-CM | POA: Diagnosis not present

## 2022-09-02 DIAGNOSIS — G301 Alzheimer's disease with late onset: Secondary | ICD-10-CM | POA: Diagnosis not present

## 2022-09-02 DIAGNOSIS — K5901 Slow transit constipation: Secondary | ICD-10-CM | POA: Diagnosis not present

## 2022-09-02 DIAGNOSIS — E8881 Metabolic syndrome: Secondary | ICD-10-CM | POA: Diagnosis not present

## 2022-09-02 DIAGNOSIS — E785 Hyperlipidemia, unspecified: Secondary | ICD-10-CM | POA: Diagnosis not present

## 2022-09-02 DIAGNOSIS — E538 Deficiency of other specified B group vitamins: Secondary | ICD-10-CM | POA: Diagnosis not present

## 2022-09-02 DIAGNOSIS — N1831 Chronic kidney disease, stage 3a: Secondary | ICD-10-CM | POA: Diagnosis not present

## 2022-09-02 DIAGNOSIS — I129 Hypertensive chronic kidney disease with stage 1 through stage 4 chronic kidney disease, or unspecified chronic kidney disease: Secondary | ICD-10-CM | POA: Diagnosis not present

## 2022-09-02 DIAGNOSIS — F0284 Dementia in other diseases classified elsewhere, unspecified severity, with anxiety: Secondary | ICD-10-CM | POA: Diagnosis not present

## 2022-09-02 DIAGNOSIS — G309 Alzheimer's disease, unspecified: Secondary | ICD-10-CM | POA: Diagnosis not present

## 2022-09-02 DIAGNOSIS — Z6830 Body mass index (BMI) 30.0-30.9, adult: Secondary | ICD-10-CM | POA: Diagnosis not present

## 2022-09-02 DIAGNOSIS — E663 Overweight: Secondary | ICD-10-CM | POA: Diagnosis not present

## 2022-09-09 DIAGNOSIS — I739 Peripheral vascular disease, unspecified: Secondary | ICD-10-CM | POA: Diagnosis not present

## 2022-09-09 DIAGNOSIS — E782 Mixed hyperlipidemia: Secondary | ICD-10-CM | POA: Diagnosis not present

## 2022-09-09 DIAGNOSIS — I8390 Asymptomatic varicose veins of unspecified lower extremity: Secondary | ICD-10-CM | POA: Diagnosis not present

## 2022-09-09 DIAGNOSIS — G301 Alzheimer's disease with late onset: Secondary | ICD-10-CM | POA: Diagnosis not present

## 2022-09-09 DIAGNOSIS — F02B4 Dementia in other diseases classified elsewhere, moderate, with anxiety: Secondary | ICD-10-CM | POA: Diagnosis not present

## 2022-09-09 DIAGNOSIS — K5901 Slow transit constipation: Secondary | ICD-10-CM | POA: Diagnosis not present

## 2022-09-10 DIAGNOSIS — G301 Alzheimer's disease with late onset: Secondary | ICD-10-CM | POA: Diagnosis not present

## 2022-09-10 DIAGNOSIS — E663 Overweight: Secondary | ICD-10-CM | POA: Diagnosis not present

## 2022-09-10 DIAGNOSIS — K5901 Slow transit constipation: Secondary | ICD-10-CM | POA: Diagnosis not present

## 2022-09-10 DIAGNOSIS — L249 Irritant contact dermatitis, unspecified cause: Secondary | ICD-10-CM | POA: Diagnosis not present

## 2022-09-10 DIAGNOSIS — G309 Alzheimer's disease, unspecified: Secondary | ICD-10-CM | POA: Diagnosis not present

## 2022-09-10 DIAGNOSIS — E785 Hyperlipidemia, unspecified: Secondary | ICD-10-CM | POA: Diagnosis not present

## 2022-09-10 DIAGNOSIS — Z6830 Body mass index (BMI) 30.0-30.9, adult: Secondary | ICD-10-CM | POA: Diagnosis not present

## 2022-09-10 DIAGNOSIS — N1831 Chronic kidney disease, stage 3a: Secondary | ICD-10-CM | POA: Diagnosis not present

## 2022-09-10 DIAGNOSIS — I129 Hypertensive chronic kidney disease with stage 1 through stage 4 chronic kidney disease, or unspecified chronic kidney disease: Secondary | ICD-10-CM | POA: Diagnosis not present

## 2022-09-10 DIAGNOSIS — E538 Deficiency of other specified B group vitamins: Secondary | ICD-10-CM | POA: Diagnosis not present

## 2022-09-10 DIAGNOSIS — E8881 Metabolic syndrome: Secondary | ICD-10-CM | POA: Diagnosis not present

## 2022-09-10 DIAGNOSIS — F0284 Dementia in other diseases classified elsewhere, unspecified severity, with anxiety: Secondary | ICD-10-CM | POA: Diagnosis not present

## 2022-09-20 DIAGNOSIS — I7389 Other specified peripheral vascular diseases: Secondary | ICD-10-CM | POA: Diagnosis not present

## 2022-09-20 DIAGNOSIS — I129 Hypertensive chronic kidney disease with stage 1 through stage 4 chronic kidney disease, or unspecified chronic kidney disease: Secondary | ICD-10-CM | POA: Diagnosis not present

## 2022-10-07 DIAGNOSIS — F419 Anxiety disorder, unspecified: Secondary | ICD-10-CM | POA: Diagnosis not present

## 2022-10-07 DIAGNOSIS — Z6828 Body mass index (BMI) 28.0-28.9, adult: Secondary | ICD-10-CM | POA: Diagnosis not present

## 2022-10-07 DIAGNOSIS — E663 Overweight: Secondary | ICD-10-CM | POA: Diagnosis not present

## 2022-10-07 DIAGNOSIS — F02B4 Dementia in other diseases classified elsewhere, moderate, with anxiety: Secondary | ICD-10-CM | POA: Diagnosis not present

## 2022-10-07 DIAGNOSIS — F331 Major depressive disorder, recurrent, moderate: Secondary | ICD-10-CM | POA: Diagnosis not present

## 2022-10-07 DIAGNOSIS — Z79899 Other long term (current) drug therapy: Secondary | ICD-10-CM | POA: Diagnosis not present

## 2022-10-07 DIAGNOSIS — G301 Alzheimer's disease with late onset: Secondary | ICD-10-CM | POA: Diagnosis not present

## 2022-10-10 ENCOUNTER — Non-Acute Institutional Stay: Payer: PPO | Admitting: Hospice

## 2022-10-10 DIAGNOSIS — F419 Anxiety disorder, unspecified: Secondary | ICD-10-CM

## 2022-10-10 DIAGNOSIS — F0390 Unspecified dementia without behavioral disturbance: Secondary | ICD-10-CM | POA: Diagnosis not present

## 2022-10-10 DIAGNOSIS — Z515 Encounter for palliative care: Secondary | ICD-10-CM | POA: Diagnosis not present

## 2022-10-10 DIAGNOSIS — F32A Depression, unspecified: Secondary | ICD-10-CM | POA: Diagnosis not present

## 2022-10-10 NOTE — Progress Notes (Signed)
    [place] Consult Note Telephone: [phone]  Fax: [phone]    Date of encounter: 10/10/22  PATIENT NAME: Seth Thompson [place] [address]   [phone] (home)  DOB: 29-May-1946 MRN: [id number] PRIMARY CARE PROVIDER:    Cathie Beams  REFERRING PROVIDER:   Cathie Beams  RESPONSIBLE PARTY:    Contact Information     Name Relation Home Work [address]   Rajendra, Spiller Son   [phone]   BRAM, HOTTEL [phone]          I met face to face with patient in the facility. Palliative Care was asked to follow this patient by consultation request of   [facility] address advance care planning and complex medical decision making. This is a follow up visit.                                   ASSESSMENT AND PLAN / RECOMMENDATIONS:   Advance Care Planning/Goals of Care: Goals include to maximize quality of life and symptom management.  CODE STATUS: Full Code Symptom Management/Plan:  Dementia-memory loss/confusion is progressing in line with dementia disease trajectory.  Assist with adl's as needed. Monitor for falls/safety. Continue donepezil and memantine as directed.   Anxiety and depression-continue sertraline as ordered.  Duse de-escalation techniques, encourage socialization.  Psych consult as planned/needed.    Follow up Palliative Care Visit: Palliative care will continue to follow for complex medical decision making, advance care planning, and clarification of goals. Return in 8-10 weeks or prn.  HOSPICE ELIGIBILITY/DIAGNOSIS: TBD  Chief Complaint: Palliative Medicine follow up visit.   HISTORY OF PRESENT ILLNESS:  Seth Thompson is a 77 y.o. year old male  with multiple morbidities with high risk of complications and mortality: Dementia, hypertension, bradycardia, hyperlipidemia.     History obtained from review of EMR, discussion with primary team, and interview with  family, facility staff/caregiver and/or Seth Thompson.  A 10 point ROS was asked and negative I reviewed available labs, medications, imaging, studies and related documents from the EMR.  Records reviewed and summarized above.   I spent 40 minutes providing this consultation; this includes time spent with patient/family, chart review and documentation. More than 50% of the time in this consultation was spent on counseling and coordinating communication.  Thank you for the opportunity to participate in the care of Seth Thompson.  The palliative care team will continue to follow. Please call our office at [phone] if we can be of additional assistance.   Teodoro Spray, NP

## 2022-11-04 DIAGNOSIS — E785 Hyperlipidemia, unspecified: Secondary | ICD-10-CM | POA: Diagnosis not present

## 2022-11-04 DIAGNOSIS — F331 Major depressive disorder, recurrent, moderate: Secondary | ICD-10-CM | POA: Diagnosis not present

## 2022-11-04 DIAGNOSIS — I739 Peripheral vascular disease, unspecified: Secondary | ICD-10-CM | POA: Diagnosis not present

## 2022-11-04 DIAGNOSIS — G301 Alzheimer's disease with late onset: Secondary | ICD-10-CM | POA: Diagnosis not present

## 2022-11-04 DIAGNOSIS — G4700 Insomnia, unspecified: Secondary | ICD-10-CM | POA: Diagnosis not present

## 2022-11-04 DIAGNOSIS — F02B4 Dementia in other diseases classified elsewhere, moderate, with anxiety: Secondary | ICD-10-CM | POA: Diagnosis not present

## 2022-11-04 DIAGNOSIS — Z87891 Personal history of nicotine dependence: Secondary | ICD-10-CM | POA: Diagnosis not present

## 2022-11-06 DIAGNOSIS — I7389 Other specified peripheral vascular diseases: Secondary | ICD-10-CM | POA: Diagnosis not present

## 2022-11-06 DIAGNOSIS — I129 Hypertensive chronic kidney disease with stage 1 through stage 4 chronic kidney disease, or unspecified chronic kidney disease: Secondary | ICD-10-CM | POA: Diagnosis not present

## 2022-11-07 DIAGNOSIS — I10 Essential (primary) hypertension: Secondary | ICD-10-CM | POA: Diagnosis not present

## 2022-11-07 DIAGNOSIS — E782 Mixed hyperlipidemia: Secondary | ICD-10-CM | POA: Diagnosis not present

## 2022-11-08 DIAGNOSIS — R269 Unspecified abnormalities of gait and mobility: Secondary | ICD-10-CM | POA: Diagnosis not present

## 2022-11-08 DIAGNOSIS — G301 Alzheimer's disease with late onset: Secondary | ICD-10-CM | POA: Diagnosis not present

## 2022-11-08 DIAGNOSIS — I739 Peripheral vascular disease, unspecified: Secondary | ICD-10-CM | POA: Diagnosis not present

## 2022-11-08 DIAGNOSIS — G4709 Other insomnia: Secondary | ICD-10-CM | POA: Diagnosis not present

## 2022-11-08 DIAGNOSIS — K5901 Slow transit constipation: Secondary | ICD-10-CM | POA: Diagnosis not present

## 2022-11-08 DIAGNOSIS — E538 Deficiency of other specified B group vitamins: Secondary | ICD-10-CM | POA: Diagnosis not present

## 2022-11-18 ENCOUNTER — Non-Acute Institutional Stay: Payer: PPO | Admitting: Hospice

## 2022-11-18 DIAGNOSIS — F0390 Unspecified dementia without behavioral disturbance: Secondary | ICD-10-CM

## 2022-11-18 DIAGNOSIS — R269 Unspecified abnormalities of gait and mobility: Secondary | ICD-10-CM

## 2022-11-18 DIAGNOSIS — F419 Anxiety disorder, unspecified: Secondary | ICD-10-CM

## 2022-11-18 DIAGNOSIS — F32A Depression, unspecified: Secondary | ICD-10-CM

## 2022-11-18 DIAGNOSIS — Z515 Encounter for palliative care: Secondary | ICD-10-CM | POA: Diagnosis not present

## 2022-11-18 NOTE — Progress Notes (Signed)
    [place] Consult Note Telephone: [phone]  Fax: [phone]    Date of encounter: 11/18/22  PATIENT NAME: Seth Thompson [id number]   [phone] (home)  DOB: [birth year] MRN: [id number] PRIMARY CARE PROVIDER:    Cathie Thompson  REFERRING PROVIDER:   Cathie Thompson  RESPONSIBLE PARTY:    Contact Information     Name Relation Home Work [address]   Seth Thompson Son   [phone]   Seth Thompson [phone]          I met face to face with patient in the facility. Palliative Care was asked to follow this patient by consultation request of   [facility] address advance care planning and complex medical decision making. This is a follow up visit.  NP called Seth Thompson and left him a voicemail with callback number.                                   ASSESSMENT AND PLAN / RECOMMENDATIONS:   Advance Care Planning/Goals of Care: Goals include to maximize quality of life and symptom management.  CODE STATUS: Full Code Symptom Management/Plan:  Dementia-memory loss/confusion at baseline. Continue ongoing support.  Fall/safety precautions.  Continue donepezil and memantine as directed.  Unsteady gait: Likely related to advanced dementia. Provide supervision during ambulation.  Fall precautions.  PT/OT consult as needed. Anxiety and depression- managed with sertraline. Use de-escalation techniques, encourage socialization.  Psych consult as planned/needed.    Follow up Palliative Care Visit: Palliative care will continue to follow for complex medical decision making, advance care planning, and clarification of goals. Return in 6-8 weeks or prn.  HOSPICE ELIGIBILITY/DIAGNOSIS: TBD  Chief Complaint: Palliative Medicine follow up visit.   HISTORY OF PRESENT ILLNESS:  Seth Thompson is a 77 y.o. year old male  with multiple morbidities with high risk of complications and mortality:  Dementia, hypertension, bradycardia, hyperlipidemia, gait disturbance.  Patient was pleasant during visit, denied pain/discomfort, in no acute distress  History obtained from review of EMR, discussion with primary team, and interview with family, facility staff/caregiver and/or Seth Thompson.  A 10 point ROS was asked and negative I reviewed available labs, medications, imaging, studies and related documents from the EMR.  Records reviewed and summarized above.   I spent 40 minutes providing this consultation; this includes time spent with patient/family, chart review and documentation. More than 50% of the time in this consultation was spent on counseling and coordinating communication.  Thank you for the opportunity to participate in the care of Seth Thompson.  The palliative care team will continue to follow. Please call our office at [phone] if we can be of additional assistance.   Teodoro Spray, NP

## 2022-12-02 DIAGNOSIS — G301 Alzheimer's disease with late onset: Secondary | ICD-10-CM | POA: Diagnosis not present

## 2022-12-02 DIAGNOSIS — R2681 Unsteadiness on feet: Secondary | ICD-10-CM | POA: Diagnosis not present

## 2022-12-02 DIAGNOSIS — U071 COVID-19: Secondary | ICD-10-CM | POA: Diagnosis not present

## 2022-12-02 DIAGNOSIS — F02B4 Dementia in other diseases classified elsewhere, moderate, with anxiety: Secondary | ICD-10-CM | POA: Diagnosis not present

## 2022-12-02 DIAGNOSIS — F419 Anxiety disorder, unspecified: Secondary | ICD-10-CM | POA: Diagnosis not present

## 2022-12-02 DIAGNOSIS — K5900 Constipation, unspecified: Secondary | ICD-10-CM | POA: Diagnosis not present

## 2022-12-04 DIAGNOSIS — G300 Alzheimer's disease with early onset: Secondary | ICD-10-CM | POA: Diagnosis not present

## 2022-12-04 DIAGNOSIS — L608 Other nail disorders: Secondary | ICD-10-CM | POA: Diagnosis not present

## 2022-12-04 DIAGNOSIS — L851 Acquired keratosis [keratoderma] palmaris et plantaris: Secondary | ICD-10-CM | POA: Diagnosis not present

## 2022-12-04 DIAGNOSIS — B351 Tinea unguium: Secondary | ICD-10-CM | POA: Diagnosis not present

## 2022-12-04 DIAGNOSIS — I129 Hypertensive chronic kidney disease with stage 1 through stage 4 chronic kidney disease, or unspecified chronic kidney disease: Secondary | ICD-10-CM | POA: Diagnosis not present

## 2022-12-04 DIAGNOSIS — M79674 Pain in right toe(s): Secondary | ICD-10-CM | POA: Diagnosis not present

## 2022-12-04 DIAGNOSIS — M79675 Pain in left toe(s): Secondary | ICD-10-CM | POA: Diagnosis not present

## 2022-12-04 DIAGNOSIS — I7389 Other specified peripheral vascular diseases: Secondary | ICD-10-CM | POA: Diagnosis not present

## 2022-12-04 DIAGNOSIS — R2689 Other abnormalities of gait and mobility: Secondary | ICD-10-CM | POA: Diagnosis not present

## 2022-12-11 ENCOUNTER — Non-Acute Institutional Stay: Payer: PPO | Admitting: Hospice

## 2022-12-11 DIAGNOSIS — Z515 Encounter for palliative care: Secondary | ICD-10-CM

## 2022-12-11 DIAGNOSIS — F32A Depression, unspecified: Secondary | ICD-10-CM | POA: Diagnosis not present

## 2022-12-11 DIAGNOSIS — F419 Anxiety disorder, unspecified: Secondary | ICD-10-CM | POA: Diagnosis not present

## 2022-12-11 DIAGNOSIS — F0390 Unspecified dementia without behavioral disturbance: Secondary | ICD-10-CM

## 2022-12-11 DIAGNOSIS — R269 Unspecified abnormalities of gait and mobility: Secondary | ICD-10-CM | POA: Diagnosis not present

## 2022-12-11 NOTE — Progress Notes (Signed)
    [place] Consult Note Telephone: [phone]  Fax: [phone]    Date of encounter: 12/11/22  PATIENT NAME: Seth Thompson [place] [address]   [phone] (home)  DOB: 1946-06-22 MRN: [id number] PRIMARY CARE PROVIDER:    Cathie Beams  REFERRING PROVIDER:   Cathie Beams  RESPONSIBLE PARTY:    Contact Information     Name Relation Home Work [address]   Narayan, [place] Son   [phone]   ANANTH, MAISONET [phone]          I met face to face with patient in the facility. Palliative Care was asked to follow this patient by consultation request of   [facility] address advance care planning and complex medical decision making. This is a follow up visit.   Visit consisted of counseling and education dealing with the complex and emotionally intense issues of symptom management and palliative care in the setting of serious and potentially life-threatening illness. Palliative care team will continue to support patient, patient's family, and medical team.                                  ASSESSMENT AND PLAN / RECOMMENDATIONS:   Advance Care Planning/Goals of Care: Goals include to maximize quality of life and symptom management.  CODE STATUS: Full Code Symptom Management/Plan:  Dementia-  progressive memory loss/confusion in line with Dementia disease trajectory. Continue ongoing support.  Fall/safety precautions.  Continue donepezil and memantine as directed.  Unsteady gait:related to advanced dementia. Provide supervision during ambulation.  Fall precautions.  PT/OT consult as needed. Anxiety and depression- Improving, continue with sertraline. Use de-escalation techniques, encourage socialization.  Psych consult as planned/needed.    Follow up Palliative Care Visit: Palliative care will continue to follow for complex medical decision making, advance care planning, and clarification of  goals. Return in 6-8 weeks or prn.  HOSPICE ELIGIBILITY/DIAGNOSIS: TBD  Chief Complaint: Palliative Medicine follow up visit.   HISTORY OF PRESENT ILLNESS:  Seth Thompson is a 77 y.o. year old male  with multiple morbidities with high risk of complications and mortality: Dementia, hypertension, bradycardia, hyperlipidemia, gait disturbance. Report of recent COVID 19 infection last week, completed Paxlovid, in no respiratory distress.  Patient denied pain/discomfort, in no respiratory distress.   History obtained from review of EMR, discussion with primary team, and interview with family, facility staff/caregiver and/or Mr. Wanless.  A 10 point ROS was asked and negative I reviewed available labs, medications, imaging, studies and related documents from the EMR.  Records reviewed and summarized above.   I spent 40 minutes providing this consultation; this includes time spent with patient/family, chart review and documentation. More than 50% of the time in this consultation was spent on counseling and coordinating communication.  Thank you for the opportunity to participate in the care of Seth Thompson.  The palliative care team will continue to follow. Please call our office at [phone] if we can be of additional assistance.   Teodoro Spray, NP

## 2022-12-30 DIAGNOSIS — N1831 Chronic kidney disease, stage 3a: Secondary | ICD-10-CM | POA: Diagnosis not present

## 2022-12-30 DIAGNOSIS — Z8616 Personal history of COVID-19: Secondary | ICD-10-CM | POA: Diagnosis not present

## 2022-12-30 DIAGNOSIS — R7303 Prediabetes: Secondary | ICD-10-CM | POA: Diagnosis not present

## 2023-01-02 DIAGNOSIS — I129 Hypertensive chronic kidney disease with stage 1 through stage 4 chronic kidney disease, or unspecified chronic kidney disease: Secondary | ICD-10-CM | POA: Diagnosis not present

## 2023-01-02 DIAGNOSIS — I7389 Other specified peripheral vascular diseases: Secondary | ICD-10-CM | POA: Diagnosis not present

## 2023-01-03 DIAGNOSIS — F331 Major depressive disorder, recurrent, moderate: Secondary | ICD-10-CM | POA: Diagnosis not present

## 2023-01-03 DIAGNOSIS — F02B4 Dementia in other diseases classified elsewhere, moderate, with anxiety: Secondary | ICD-10-CM | POA: Diagnosis not present

## 2023-01-03 DIAGNOSIS — G301 Alzheimer's disease with late onset: Secondary | ICD-10-CM | POA: Diagnosis not present

## 2023-01-27 DIAGNOSIS — E663 Overweight: Secondary | ICD-10-CM | POA: Diagnosis not present

## 2023-01-27 DIAGNOSIS — K5900 Constipation, unspecified: Secondary | ICD-10-CM | POA: Diagnosis not present

## 2023-01-27 DIAGNOSIS — R269 Unspecified abnormalities of gait and mobility: Secondary | ICD-10-CM | POA: Diagnosis not present

## 2023-01-27 DIAGNOSIS — Z6826 Body mass index (BMI) 26.0-26.9, adult: Secondary | ICD-10-CM | POA: Diagnosis not present

## 2023-01-27 DIAGNOSIS — F02B4 Dementia in other diseases classified elsewhere, moderate, with anxiety: Secondary | ICD-10-CM | POA: Diagnosis not present

## 2023-01-27 DIAGNOSIS — G301 Alzheimer's disease with late onset: Secondary | ICD-10-CM | POA: Diagnosis not present

## 2023-01-29 ENCOUNTER — Non-Acute Institutional Stay: Payer: PPO | Admitting: Hospice

## 2023-01-29 DIAGNOSIS — R269 Unspecified abnormalities of gait and mobility: Secondary | ICD-10-CM

## 2023-01-29 DIAGNOSIS — I129 Hypertensive chronic kidney disease with stage 1 through stage 4 chronic kidney disease, or unspecified chronic kidney disease: Secondary | ICD-10-CM | POA: Diagnosis not present

## 2023-01-29 DIAGNOSIS — F0390 Unspecified dementia without behavioral disturbance: Secondary | ICD-10-CM | POA: Diagnosis not present

## 2023-01-29 DIAGNOSIS — F32A Depression, unspecified: Secondary | ICD-10-CM

## 2023-01-29 DIAGNOSIS — F419 Anxiety disorder, unspecified: Secondary | ICD-10-CM | POA: Diagnosis not present

## 2023-01-29 DIAGNOSIS — I7389 Other specified peripheral vascular diseases: Secondary | ICD-10-CM | POA: Diagnosis not present

## 2023-01-29 DIAGNOSIS — Z515 Encounter for palliative care: Secondary | ICD-10-CM

## 2023-01-29 NOTE — Progress Notes (Signed)
    Therapist, [facility] Palliative [facility] Consult Note Telephone: [phone]  Fax: [phone]    Date of encounter: 01/29/23  PATIENT NAME: LENUS TRAUGER [address] [place] [address]   [phone] (home)  DOB: January 30, 1946 MRN: [id number] PRIMARY CARE PROVIDER:    Chapman Moss  REFERRING PROVIDER:   Chapman Moss  RESPONSIBLE PARTY:    Contact Information     Name Relation Home Work [address]   Joshau, Code Son   [phone]   HUMPHREY, GUERREIRO [phone]          I met face to face with patient in the facility. Palliative Care was asked to follow this patient by consultation request of   [facility] address advance care planning and complex medical decision making. This is a follow up visit.   NP called Genelle Bal and left him a voicemail with callback number. Visit consisted of counseling and education dealing with the complex and emotionally intense issues of symptom management and palliative care in the setting of serious and potentially life-threatening illness. Palliative care team will continue to support patient, patient's family, and medical team.                                  ASSESSMENT AND PLAN / RECOMMENDATIONS:   Advance Care Planning/Goals of Care: Goals include to maximize quality of life and symptom management.  CODE STATUS: Full Code Symptom Management/Plan:  Dementia-  Continue ongoing support.  Fall/safety precautions.  Continue donepezil and memantine as directed.  Encourage reminiscence, word search/puzzles, participation in facility activities. Unsteady gait: Fall precautions.  PT/OT consult as needed. Provide supervision during ambulation.   Anxiety and depression- Improved; continue with sertraline. Use de-escalation techniques, encourage socialization.  Psych consult as planned/needed.    Follow up Palliative Care Visit: Palliative care will continue to follow for chronic disease progression,  complex medical decision making, advance care planning, and clarification of goals. Return in 6-8 weeks or prn.  HOSPICE ELIGIBILITY/DIAGNOSIS: TBD  Chief Complaint: Palliative Medicine follow up visit.   HISTORY OF PRESENT ILLNESS:  SANDRO BURGO is a 77 y.o. year old male  with multiple morbidities with high risk of complications and mortality: Dementia, hypertension, bradycardia, hyperlipidemia, gait disturbance.  History of COVID- 19 infection.  Patient denied pain/discomfort, in no respiratory distress.  Patient seen interacting with other residents, denied moodiness, FLACC 0.  History obtained from review of EMR, discussion with primary team, and interview with family, facility staff/caregiver and/or Mr. Domingo.  A 10 point ROS was asked and negative I reviewed available labs, medications, imaging, studies and related documents from the EMR.  Records reviewed and summarized above.   I spent 60 minutes providing this consultation; this includes time spent with patient/family/clinical staff, chart review and documentation. More than 50% of the time in this consultation was spent on counseling and coordinating communication.  Thank you for the opportunity to participate in the care of Mr. Demarest.  The palliative care team will continue to follow. Please call our office at [phone] if we can be of additional assistance.   Rosaura Carpenter, NP

## 2023-01-30 DIAGNOSIS — G301 Alzheimer's disease with late onset: Secondary | ICD-10-CM | POA: Diagnosis not present

## 2023-01-30 DIAGNOSIS — F02B4 Dementia in other diseases classified elsewhere, moderate, with anxiety: Secondary | ICD-10-CM | POA: Diagnosis not present

## 2023-01-30 DIAGNOSIS — F331 Major depressive disorder, recurrent, moderate: Secondary | ICD-10-CM | POA: Diagnosis not present

## 2023-02-06 DIAGNOSIS — I10 Essential (primary) hypertension: Secondary | ICD-10-CM | POA: Diagnosis not present

## 2023-02-17 DIAGNOSIS — G301 Alzheimer's disease with late onset: Secondary | ICD-10-CM | POA: Diagnosis not present

## 2023-02-17 DIAGNOSIS — I739 Peripheral vascular disease, unspecified: Secondary | ICD-10-CM | POA: Diagnosis not present

## 2023-02-17 DIAGNOSIS — F02B4 Dementia in other diseases classified elsewhere, moderate, with anxiety: Secondary | ICD-10-CM | POA: Diagnosis not present

## 2023-02-17 DIAGNOSIS — D72810 Lymphocytopenia: Secondary | ICD-10-CM | POA: Diagnosis not present

## 2023-02-17 DIAGNOSIS — G4700 Insomnia, unspecified: Secondary | ICD-10-CM | POA: Diagnosis not present

## 2023-02-18 DIAGNOSIS — F3342 Major depressive disorder, recurrent, in full remission: Secondary | ICD-10-CM | POA: Diagnosis not present

## 2023-02-18 DIAGNOSIS — F329 Major depressive disorder, single episode, unspecified: Secondary | ICD-10-CM | POA: Diagnosis not present

## 2023-02-20 DIAGNOSIS — M79675 Pain in left toe(s): Secondary | ICD-10-CM | POA: Diagnosis not present

## 2023-02-20 DIAGNOSIS — R2689 Other abnormalities of gait and mobility: Secondary | ICD-10-CM | POA: Diagnosis not present

## 2023-02-20 DIAGNOSIS — L84 Corns and callosities: Secondary | ICD-10-CM | POA: Diagnosis not present

## 2023-02-20 DIAGNOSIS — M79674 Pain in right toe(s): Secondary | ICD-10-CM | POA: Diagnosis not present

## 2023-02-20 DIAGNOSIS — F0390 Unspecified dementia without behavioral disturbance: Secondary | ICD-10-CM | POA: Diagnosis not present

## 2023-02-20 DIAGNOSIS — B351 Tinea unguium: Secondary | ICD-10-CM | POA: Diagnosis not present

## 2023-02-20 DIAGNOSIS — L603 Nail dystrophy: Secondary | ICD-10-CM | POA: Diagnosis not present

## 2023-02-27 DIAGNOSIS — F0390 Unspecified dementia without behavioral disturbance: Secondary | ICD-10-CM | POA: Diagnosis not present

## 2023-02-27 DIAGNOSIS — E785 Hyperlipidemia, unspecified: Secondary | ICD-10-CM | POA: Diagnosis not present

## 2023-02-27 DIAGNOSIS — G4700 Insomnia, unspecified: Secondary | ICD-10-CM | POA: Diagnosis not present

## 2023-02-27 DIAGNOSIS — I739 Peripheral vascular disease, unspecified: Secondary | ICD-10-CM | POA: Diagnosis not present

## 2023-02-27 DIAGNOSIS — K5900 Constipation, unspecified: Secondary | ICD-10-CM | POA: Diagnosis not present

## 2023-02-27 DIAGNOSIS — M6281 Muscle weakness (generalized): Secondary | ICD-10-CM | POA: Diagnosis not present

## 2023-03-08 ENCOUNTER — Emergency Department: Payer: PPO

## 2023-03-08 ENCOUNTER — Other Ambulatory Visit: Payer: Self-pay

## 2023-03-08 ENCOUNTER — Emergency Department
Admission: EM | Admit: 2023-03-08 | Discharge: 2023-03-08 | Disposition: A | Discharge status: Home / Self Care | Payer: PPO | Attending: Emergency Medicine | Admitting: Emergency Medicine

## 2023-03-08 DIAGNOSIS — K5730 Diverticulosis of large intestine without perforation or abscess without bleeding: Secondary | ICD-10-CM | POA: Diagnosis not present

## 2023-03-08 DIAGNOSIS — R609 Edema, unspecified: Secondary | ICD-10-CM | POA: Diagnosis not present

## 2023-03-08 DIAGNOSIS — N281 Cyst of kidney, acquired: Secondary | ICD-10-CM | POA: Diagnosis not present

## 2023-03-08 DIAGNOSIS — F0390 Unspecified dementia without behavioral disturbance: Secondary | ICD-10-CM | POA: Insufficient documentation

## 2023-03-08 DIAGNOSIS — R001 Bradycardia, unspecified: Secondary | ICD-10-CM | POA: Diagnosis not present

## 2023-03-08 DIAGNOSIS — R402411 Glasgow coma scale score 13-15, in the field [EMT or ambulance]: Secondary | ICD-10-CM | POA: Diagnosis not present

## 2023-03-08 DIAGNOSIS — N5089 Other specified disorders of the male genital organs: Secondary | ICD-10-CM | POA: Insufficient documentation

## 2023-03-08 DIAGNOSIS — R61 Generalized hyperhidrosis: Secondary | ICD-10-CM | POA: Diagnosis not present

## 2023-03-08 DIAGNOSIS — K4090 Unilateral inguinal hernia, without obstruction or gangrene, not specified as recurrent: Secondary | ICD-10-CM | POA: Insufficient documentation

## 2023-03-08 DIAGNOSIS — R1030 Lower abdominal pain, unspecified: Secondary | ICD-10-CM | POA: Diagnosis present

## 2023-03-08 DIAGNOSIS — N200 Calculus of kidney: Secondary | ICD-10-CM | POA: Diagnosis not present

## 2023-03-08 DIAGNOSIS — N433 Hydrocele, unspecified: Secondary | ICD-10-CM | POA: Diagnosis not present

## 2023-03-08 DIAGNOSIS — I10 Essential (primary) hypertension: Secondary | ICD-10-CM | POA: Diagnosis not present

## 2023-03-08 DIAGNOSIS — Z743 Need for continuous supervision: Secondary | ICD-10-CM | POA: Diagnosis not present

## 2023-03-08 HISTORY — DX: Hyperlipidemia, unspecified: E78.5

## 2023-03-08 HISTORY — DX: Unspecified dementia, unspecified severity, without behavioral disturbance, psychotic disturbance, mood disturbance, and anxiety: F03.90

## 2023-03-08 LAB — COMPREHENSIVE METABOLIC PANEL
ALT: 26 U/L (ref 0–44)
AST: 31 U/L (ref 15–41)
Albumin: 4.1 g/dL (ref 3.5–5.0)
Alkaline Phosphatase: 35 U/L — ABNORMAL LOW (ref 38–126)
Anion gap: 8 (ref 5–15)
BUN: 17 mg/dL (ref 8–23)
CO2: 25 mmol/L (ref 22–32)
Calcium: 8.8 mg/dL — ABNORMAL LOW (ref 8.9–10.3)
Chloride: 105 mmol/L (ref 98–111)
Creatinine, Ser: 0.88 mg/dL (ref 0.61–1.24)
GFR, Estimated: >60 mL/min (ref >60)
Glucose, Bld: 130 mg/dL — ABNORMAL HIGH (ref 70–99)
Potassium: 3.4 mmol/L — ABNORMAL LOW (ref 3.5–5.1)
Sodium: 138 mmol/L (ref 135–145)
Total Bilirubin: 0.7 mg/dL (ref 0.3–1.2)
Total Protein: 7 g/dL (ref 6.5–8.1)

## 2023-03-08 LAB — CBC WITH DIFFERENTIAL/PLATELET
Abs Immature Granulocytes: 0.03 10*3/uL (ref 0.00–0.07)
Basophils Absolute: 0 10*3/uL (ref 0.0–0.1)
Basophils Relative: 1 %
Eosinophils Absolute: 0 10*3/uL (ref 0.0–0.5)
Eosinophils Relative: 0 %
HCT: 48.2 % (ref 39.0–52.0)
Hemoglobin: 16.6 g/dL (ref 13.0–17.0)
Immature Granulocytes: 0 %
Lymphocytes Relative: 8 %
Lymphs Abs: 0.7 10*3/uL (ref 0.7–4.0)
MCH: 31 pg (ref 26.0–34.0)
MCHC: 34.4 g/dL (ref 30.0–36.0)
MCV: 89.9 fL (ref 80.0–100.0)
Monocytes Absolute: 0.4 10*3/uL (ref 0.1–1.0)
Monocytes Relative: 5 %
Neutro Abs: 6.9 10*3/uL (ref 1.7–7.7)
Neutrophils Relative %: 86 %
Platelets: 150 10*3/uL (ref 150–400)
RBC: 5.36 MIL/uL (ref 4.22–5.81)
RDW: 13.5 % (ref 11.5–15.5)
WBC: 8 10*3/uL (ref 4.0–10.5)
nRBC: 0 % (ref 0.0–0.2)

## 2023-03-08 LAB — URINALYSIS, ROUTINE W REFLEX MICROSCOPIC
Bilirubin Urine: NEGATIVE
Glucose, UA: NEGATIVE mg/dL
Hgb urine dipstick: NEGATIVE
Ketones, ur: NEGATIVE mg/dL
Leukocytes,Ua: NEGATIVE
Nitrite: NEGATIVE
Protein, ur: NEGATIVE mg/dL
Specific Gravity, Urine: 1.015 (ref 1.005–1.030)
pH: 6 (ref 5.0–8.0)

## 2023-03-08 LAB — CHLAMYDIA/NGC RT PCR (ARMC ONLY)
Chlamydia Tr: NOT DETECTED
N gonorrhoeae: NOT DETECTED

## 2023-03-08 MED ORDER — IOHEXOL 300 MG/ML  SOLN
100.0000 mL | Freq: Once | INTRAMUSCULAR | Status: AC | PRN
  Administered 2023-03-08: 100 mL via INTRAVENOUS

## 2023-03-08 MED ORDER — HALOPERIDOL LACTATE 5 MG/ML IJ SOLN
2.0000 mg | Freq: Once | INTRAMUSCULAR | Status: AC
  Administered 2023-03-08: 2 mg via INTRAVENOUS
  Filled 2023-03-08: qty 1

## 2023-03-08 NOTE — ED Notes (Signed)
Pt back from CT

## 2023-03-08 NOTE — ED Notes (Signed)
Pt identification band and fall risk band placed on pts right ankle due to pt pulling both off, leaving red abrasions on his left wrist from where both bands were.   Pt restless and attempting to pull out IV, pulling on side rails and refusing to let go in attempts to get out of bed, and swatting at EDT when EDT redirects pt back in bed.   Pt wanted to use the bathroom but will not allow EDT to help him use the urinal at this time. Pt adamant he needs to get up but unable to verbalize why or where he needs to go.

## 2023-03-08 NOTE — ED Notes (Signed)
Pt stood up with assistance to use the urinal. Pt started having a BM while standing. Pt was assisted back to bed and pt cleaned and provided with clean brief. Pt resting comfortably at this time.

## 2023-03-08 NOTE — ED Notes (Signed)
Pt punched EDT in the arm due to pt not wanting to stay in bed. Pt still in bed now with bed alarm on, both side rails raised, bed positioned for pt safety, and EDT within arms reach of pt.

## 2023-03-08 NOTE — ED Notes (Signed)
CT called and informed pt is now ready for CT

## 2023-03-08 NOTE — ED Notes (Signed)
Pt belongings labeled in two separate pt belonging bags. Soiled pt belongings placed in one labeled pt belonging bag, clean belongings placed in the second bag. Both bags labeled with pts MRN stickers

## 2023-03-08 NOTE — ED Notes (Signed)
Pt continuously pulling at IV, fall risk wristband, and pt identification wrist band, Iv was wrapped with co-band, which pt pulled off. Pt tries to scoot to the end of bed in attempts to get up, but is redirectable by EDT at this time. Pt speaking mostly incomprehensible words with no fluid thought process.  Pt requires multiple reminders and much redirection to prevent pt from pulling on IV and getting out of bed.  EDT safety sitter remains at bedside within arms reach of pt.

## 2023-03-08 NOTE — ED Provider Notes (Signed)
[facility] Provider Note    Event Date/Time   First MD Initiated Contact with Patient 03/08/23 1457     (approximate)   History   Groin Swelling   HPI  Seth Thompson is a 77 y.o. male who comes in from [facility] due to concern for right testicle pain.  Pain that started today.  I called the facility and discussed with them and they were concerned that he seemed to be more uncomfortable in his testicle.  They were not sure what side it was.  They stated that he has not had any falls recently they have no other injuries it was just more some discomfort in his lower abdomen and they felt like it was may be larger than before but they are not quite sure.  Patient does have dementia he is a poor historian.  Physical Exam   Triage Vital Signs: ED Triage Vitals  Enc Vitals Group     BP 03/08/23 1408 (!) 148/83     Pulse Rate 03/08/23 1408 (!) 59     Resp 03/08/23 1408 18     Temp 03/08/23 1408 97.7 F (36.5 C)     Temp src --      SpO2 03/08/23 1408 96 %     Weight 03/08/23 1404 190 lb (86.2 kg)     Height 03/08/23 1404 5\' 10"  (1.778 m)     Head Circumference --      Peak Flow --      Pain Score 03/08/23 1408 0     Pain Loc --      Pain Edu? --      Excl. in GC? --     Most recent vital signs: Vitals:   03/08/23 1408  BP: (!) 148/83  Pulse: (!) 59  Resp: 18  Temp: 97.7 F (36.5 C)  SpO2: 96%     General: Awake, no distress.  CV:  Good peripheral perfusion.  Resp:  Normal effort.  Abd:  No distention.  Other:  Patient has some swelling noted to the right testicle.  No skin changes of the testicle.   ED Results / Procedures / Treatments   Labs (all labs ordered are listed, but only abnormal results are displayed) Labs Reviewed  CHLAMYDIA/NGC RT PCR ([facility] ONLY)            CBC WITH DIFFERENTIAL/PLATELET  COMPREHENSIVE METABOLIC PANEL  URINALYSIS, ROUTINE W REFLEX MICROSCOPIC       RADIOLOGY I have reviewed the CT  personally interpreted and patient has inguinal hernia  PROCEDURES:  Critical Care performed: No  Procedures   MEDICATIONS ORDERED IN ED: Medications - No data to display   IMPRESSION / MDM / ASSESSMENT AND PLAN / ED COURSE  I reviewed the triage vital signs and the nursing notes.   Patient's presentation is most consistent with acute presentation with potential threat to life or bodily function.   Patient's history is limited secondary to his dementia but I did call facility and they were concerned about some increasing swelling and pain of his testicles.  On examination his right testicle does seem enlarged and an ultrasound was done to rule out testicular torsion.  Ultrasound was negative he does have some moderate bilateral hydroceles with a right inguinal hernia.  I will get a CT scan due to limited history just to ensure no obstruction or strangulation of the intestines.  Gonorrhea and Chlamydia were negative.  CBC normal CMP reassuring.  Urine without  evidence of UTI.  In order to facilitate CT imaging I did give patient 2 of IV Haldol due to his dementia.  CT imaging was reassuring does show a moderate to large right inguinal hernia which is consistent with my examination no coinciding cellulitis on my examination.  This is nonobstructed noninflamed so can be followed up outpatient with surgery but given patient's dementia probably a poor surgical candidate at this time he does not really seem to be any tenderness with palpation over the area.  He does have a segment of colonic wall thickening that patient will need a colonoscopy for I called the facility to see who his power of attorney was to ensure that they were aware of this and appears that his DSS therefore I will provide patient with a copy report in the discharge summary and asked the facility to talk to DSS about further workup for this as needed or discuss it with his primary care doctor at the facility.     FINAL  CLINICAL IMPRESSION(S) / ED DIAGNOSES   Final diagnoses:  Non-recurrent unilateral inguinal hernia without obstruction or gangrene     Rx / DC Orders   ED Discharge Orders     None        Note:  This document was prepared using Dragon voice recognition software and may include unintentional dictation errors.   Concha Se, MD 03/08/23 [phone]

## 2023-03-08 NOTE — ED Notes (Signed)
[facility] called for transport back to [facility] °

## 2023-03-08 NOTE — ED Triage Notes (Signed)
Pt from [facility]. C/o right testicle pain. EMS reports right testicle is swollen. Reports pain started today.

## 2023-03-08 NOTE — ED Notes (Signed)
Pt putting inedible items, such as co-and, his shirt, and his fall risk bracelet, in his mouth. Pt redirected and made aware to refrain from doing such actions by EDT. Pt is making determined to get his bracelets off. Pt took his pt identification bracelet off despite EDT intervention. Pt is getting increasingly more agitated and unwilling to stay in bed, pushing against EDT as EDT assist pt back in bed.   Bed alarm on, both side rails raised, fall risk bracelet on, skid free socks on, EDT at bedside, and bed positioned to promote pt safety.

## 2023-03-08 NOTE — Discharge Instructions (Addendum)
Patient has a hernia but there is no signs of obstruction therefore he can follow-up with surgery as needed for this but they typically may not want to do surgery with his dementia unless there is an obstruction-this would be if he is having worsening pain, vomiting, no bowel movements he would need to return to the ER.  However you can call the surgical number to follow-up with outpatient.  Please also discuss with his primary care doctor the colon being slightly thickened as there may need to be a discussion with his legal guardian whether or not they would like to proceed with colonoscopy but given patient's goals of care and his severe dementia this may be something that they elect not to do.  Return to the ER for fevers, redness of the skin worsening pain or any other concerns   IMPRESSION:  1. Moderate to large right inguinal hernia contains  nonobstructed/noninflamed small bowel. Small amount of free fluid in  the hernia sac.  2. Colonic diverticulosis without diverticulitis.  3. Segment of transverse colonic wall thickening versus  nondistention spanning approximately 3.3 cm. Recommend up-to-date  colonoscopy to exclude underlying colonic neoplasm.  4. Nonobstructing left nephrolithiasis.    Aortic Atherosclerosis (ICD10-I70.0).

## 2023-03-08 NOTE — ED Notes (Signed)
Pt is extremely disoriented to unfamiliar environment. Pt difficult to re-direct. Bed alarm activated. Fall risk band on patient and non-skid socks in place. [place] currently at bedside and instructed to leave door open after exam. Safety sitter requested.

## 2023-03-08 NOTE — ED Notes (Signed)
Patient transported to CT by CT Tech at this time

## 2023-03-10 ENCOUNTER — Non-Acute Institutional Stay: Payer: PPO | Admitting: Hospice

## 2023-03-10 DIAGNOSIS — Z515 Encounter for palliative care: Secondary | ICD-10-CM | POA: Diagnosis not present

## 2023-03-10 DIAGNOSIS — R269 Unspecified abnormalities of gait and mobility: Secondary | ICD-10-CM

## 2023-03-10 DIAGNOSIS — R1031 Right lower quadrant pain: Secondary | ICD-10-CM | POA: Diagnosis not present

## 2023-03-10 DIAGNOSIS — F0390 Unspecified dementia without behavioral disturbance: Secondary | ICD-10-CM | POA: Diagnosis not present

## 2023-03-10 DIAGNOSIS — N200 Calculus of kidney: Secondary | ICD-10-CM | POA: Diagnosis not present

## 2023-03-10 DIAGNOSIS — F419 Anxiety disorder, unspecified: Secondary | ICD-10-CM | POA: Diagnosis not present

## 2023-03-10 DIAGNOSIS — K4090 Unilateral inguinal hernia, without obstruction or gangrene, not specified as recurrent: Secondary | ICD-10-CM | POA: Diagnosis not present

## 2023-03-10 DIAGNOSIS — F32A Depression, unspecified: Secondary | ICD-10-CM

## 2023-03-10 DIAGNOSIS — K5730 Diverticulosis of large intestine without perforation or abscess without bleeding: Secondary | ICD-10-CM | POA: Diagnosis not present

## 2023-03-10 DIAGNOSIS — K639 Disease of intestine, unspecified: Secondary | ICD-10-CM | POA: Diagnosis not present

## 2023-03-10 NOTE — Progress Notes (Signed)
    Therapist, nutritional Palliative Care Consult Note Telephone: [phone]  Fax: [phone]    Date of encounter: 03/10/23  PATIENT NAME: Seth Thompson [address] [place] [address]   [phone] (home)  DOB: 1946-01-25 MRN: [id number] PRIMARY CARE PROVIDER:    Chapman Thompson  REFERRING PROVIDER:   Chapman Thompson  RESPONSIBLE PARTY:    Contact Information     Name Relation Home Work [address]   Jaskirat, Seth Thompson Son   [phone]   Seth Thompson, Seth Thompson [phone]          I met face to face with patient in the facility. Palliative Care was asked to follow this patient by consultation request of   [facility] address advance care planning and complex medical decision making. This is a follow up visit.    Visit consisted of counseling and education dealing with the complex and emotionally intense issues of symptom management and palliative care in the setting of serious and potentially life-threatening illness. Palliative care team will continue to support patient, patient's family, and medical team.                                  ASSESSMENT AND PLAN / RECOMMENDATIONS:   Advance Care Planning/Goals of Care: Goals include to maximize quality of life and symptom management.  CODE STATUS: Full Code Symptom Management/Plan: Patient is fairly stable, no fall since last visit, no ED visit, no hospitalization or acuities. No changes to poc; continue to monitor closely for further decline in cognition and functional status.   Dementia-  Fall/safety precautions.  Continue donepezil and memantine as directed.  Encourage reminiscence, word search/puzzles, participation in facility activities. Unsteady gait: Fall precautions.  PT/OT consult as needed.  Anxiety and depression- Improved; continue with sertraline. Use de-escalation techniques, encourage socialization.  Psych consult as planned/needed.    Follow up Palliative Care Visit:  Palliative care will continue to follow for chronic disease progression, complex medical decision making, advance care planning, and clarification of goals. Return in 6-8 weeks or prn.  HOSPICE ELIGIBILITY/DIAGNOSIS: TBD  Chief Complaint: Palliative Medicine follow up visit.   HISTORY OF PRESENT ILLNESS:  Seth Thompson is a 77 y.o. year old male  with multiple morbidities with high risk of complications and mortality: Dementia, hypertension, bradycardia, hyperlipidemia, gait disturbance.  History of COVID- 19 infection.  Patient denied pain/discomfort, in no respiratory distress.  Patient seen interacting with other residents. pleasant, denied moodiness, FLACC 0.  Nursing with no concerns today.  History obtained from review of EMR, discussion with primary team, and interview with family, facility staff/caregiver and/or Seth Thompson.  A 10 point ROS was asked and negative I reviewed available labs, medications, imaging, studies and related documents from the EMR.  Records reviewed and summarized above.   I spent 40 minutes providing this consultation; this includes time spent with patient/family/clinical staff, chart review and documentation. More than 50% of the time in this consultation was spent on counseling and coordinating communication.  Thank you for the opportunity to participate in the care of Seth Thompson.  The palliative care team will continue to follow. Please call our office at [phone] if we can be of additional assistance.   Seth Carpenter, NP

## 2023-03-12 ENCOUNTER — Telehealth: Payer: Self-pay

## 2023-03-12 NOTE — Telephone Encounter (Signed)
Transition Care Management Unsuccessful Follow-up Telephone Call  Date of discharge and from where:  [facility] 6/1  Attempts:  1st Attempt  Reason for unsuccessful TCM follow-up call:  Unable to leave message   Seth Thompson [facility] Guide, [facility] [phone] [address] [place], [place], [place] [address] Phone: [phone] Email: [email or web address]

## 2023-03-13 ENCOUNTER — Telehealth: Payer: Self-pay

## 2023-03-13 DIAGNOSIS — F5105 Insomnia due to other mental disorder: Secondary | ICD-10-CM | POA: Diagnosis not present

## 2023-03-13 DIAGNOSIS — F411 Generalized anxiety disorder: Secondary | ICD-10-CM | POA: Diagnosis not present

## 2023-03-13 DIAGNOSIS — F331 Major depressive disorder, recurrent, moderate: Secondary | ICD-10-CM | POA: Diagnosis not present

## 2023-03-13 DIAGNOSIS — G301 Alzheimer's disease with late onset: Secondary | ICD-10-CM | POA: Diagnosis not present

## 2023-03-13 NOTE — Telephone Encounter (Signed)
Transition Care Management Unsuccessful Follow-up Telephone Call  Date of discharge and from where:  [facility] 6/1   Attempts:  2nd Attempt  Reason for unsuccessful TCM follow-up call:    Unable to leave a message   Lenard Forth [facility] Guide, [facility] [phone] [address] [place], [place], [place] [address] Phone: [phone] Email: [email or web address]

## 2023-03-17 DIAGNOSIS — G301 Alzheimer's disease with late onset: Secondary | ICD-10-CM | POA: Diagnosis not present

## 2023-03-17 DIAGNOSIS — E7800 Pure hypercholesterolemia, unspecified: Secondary | ICD-10-CM | POA: Diagnosis not present

## 2023-03-17 DIAGNOSIS — F02B4 Dementia in other diseases classified elsewhere, moderate, with anxiety: Secondary | ICD-10-CM | POA: Diagnosis not present

## 2023-03-17 DIAGNOSIS — E8881 Metabolic syndrome: Secondary | ICD-10-CM | POA: Diagnosis not present

## 2023-03-17 DIAGNOSIS — F329 Major depressive disorder, single episode, unspecified: Secondary | ICD-10-CM | POA: Diagnosis not present

## 2023-03-17 DIAGNOSIS — K4090 Unilateral inguinal hernia, without obstruction or gangrene, not specified as recurrent: Secondary | ICD-10-CM | POA: Diagnosis not present

## 2023-03-17 DIAGNOSIS — R7303 Prediabetes: Secondary | ICD-10-CM | POA: Diagnosis not present

## 2023-03-17 DIAGNOSIS — I739 Peripheral vascular disease, unspecified: Secondary | ICD-10-CM | POA: Diagnosis not present

## 2023-03-19 DIAGNOSIS — G309 Alzheimer's disease, unspecified: Secondary | ICD-10-CM | POA: Diagnosis not present

## 2023-03-19 DIAGNOSIS — F3342 Major depressive disorder, recurrent, in full remission: Secondary | ICD-10-CM | POA: Diagnosis not present

## 2023-03-24 DIAGNOSIS — G301 Alzheimer's disease with late onset: Secondary | ICD-10-CM | POA: Diagnosis not present

## 2023-03-24 DIAGNOSIS — F02818 Dementia in other diseases classified elsewhere, unspecified severity, with other behavioral disturbance: Secondary | ICD-10-CM | POA: Diagnosis not present

## 2023-03-24 DIAGNOSIS — Z515 Encounter for palliative care: Secondary | ICD-10-CM | POA: Diagnosis not present

## 2023-03-24 DIAGNOSIS — F3342 Major depressive disorder, recurrent, in full remission: Secondary | ICD-10-CM | POA: Diagnosis not present

## 2023-03-24 DIAGNOSIS — F411 Generalized anxiety disorder: Secondary | ICD-10-CM | POA: Diagnosis not present

## 2023-03-24 DIAGNOSIS — K4090 Unilateral inguinal hernia, without obstruction or gangrene, not specified as recurrent: Secondary | ICD-10-CM | POA: Diagnosis not present

## 2023-03-24 DIAGNOSIS — I739 Peripheral vascular disease, unspecified: Secondary | ICD-10-CM | POA: Diagnosis not present

## 2023-03-25 ENCOUNTER — Ambulatory Visit: Payer: PPO | Admitting: Surgery

## 2023-04-01 ENCOUNTER — Ambulatory Visit (INDEPENDENT_AMBULATORY_CARE_PROVIDER_SITE_OTHER): Payer: PPO | Admitting: Surgery

## 2023-04-01 ENCOUNTER — Encounter: Payer: Self-pay | Admitting: Surgery

## 2023-04-01 VITALS — BP 138/70 | HR 80 | Temp 98.0°F | Wt 173.0 lb

## 2023-04-01 DIAGNOSIS — K4090 Unilateral inguinal hernia, without obstruction or gangrene, not specified as recurrent: Secondary | ICD-10-CM | POA: Insufficient documentation

## 2023-04-01 HISTORY — DX: Unilateral inguinal hernia, without obstruction or gangrene, not specified as recurrent: K40.90

## 2023-04-01 NOTE — Progress Notes (Signed)
Patient ID: Seth Thompson, male   DOB: [birth year], 77 y.o.   MRN: [id number]  Chief Complaint: Presents by his caregivers for right inguinal hernia  History of Present Illness Seth Thompson is a 77 y.o. male with right inguinal hernia evaluated, referred to ER.  CT scan confirmed an noncomplicated direct right inguinal hernia.  Due to his marked dementia, unable to add additional history.  Caregiver who is present with him today is not usually with him, however indicates that she is observed no evidence of pain or distress or discomfort in his gentleman.   Past Medical History Past Medical History:  Diagnosis Date   Dementia (HCC)    Diverticulitis of large intestine with abscess without bleeding 04/03/2018   Hyperlipidemia       Past Surgical History:  Procedure Laterality Date   CHOLECYSTECTOMY      Allergies  Allergen Reactions   Codeine Other (See Comments)    Current Outpatient Medications  Medication Sig Dispense Refill   cyanocobalamin (VITAMIN B12) 1000 MCG tablet Take 1,000 mcg by mouth daily.     melatonin 5 MG TABS Take 5 mg by mouth.     Multiple Vitamin (MULTIVITAMIN) capsule Take 1 capsule by mouth daily.     polyethylene glycol (MIRALAX / GLYCOLAX) 17 g packet Take 17 g by mouth daily.     senna (SENOKOT) 8.6 MG TABS tablet Take 1 tablet by mouth.     sertraline (ZOLOFT) 50 MG tablet Take 50 mg by mouth at bedtime.     No current facility-administered medications for this visit.    Family History No family history on file.    Social History Social History   Tobacco Use   Smoking status: Never    Passive exposure: Never   Smokeless tobacco: Never  Vaping Use   Vaping Use: Never used  Substance Use Topics   Alcohol use: Not Currently   Drug use: Not Currently        Review of Systems  Unable to perform ROS: Dementia     Physical Exam Blood pressure 138/70, pulse 80, temperature 98 F (36.7 C), weight 173 lb (78.5 kg), SpO2 98  %. Last Weight  Most recent update: 04/01/2023 11:28 AM    Weight  78.5 kg (173 lb)             CONSTITUTIONAL: Well developed, and nourished, appropriately responsive and aware without distress.   EYES: Sclera non-icteric.   EARS, NOSE, MOUTH AND THROAT:  The oropharynx is clear. Oral mucosa is pink and moist.   Hearing is intact to voice.  NECK: Trachea is midline, and there is no jugular venous distension.  LYMPH NODES:  Lymph nodes in the neck are not appreciated. RESPIRATORY:   Normal respiratory effort without pathologic use of accessory muscles. CARDIOVASCULAR:  Well perfused.  GI: The abdomen is  soft, nontender, and nondistended. There were no palpable masses.   GU: Readily reducible, minimally tender right groin bulge. MUSCULOSKELETAL: Walks with minimal assistance. SKIN: Skin turgor is normal. No pathologic skin lesions appreciated.  NEUROLOGIC:  Motor and sensation appear grossly normal.  Cranial nerves are grossly without defect.  Nonfocal.  No evidence of hemiplegia. PSYCH:  Affect is bright and pleasant.  Not oriented to place person or time.  Data Reviewed I have personally reviewed what is currently available of the patient's imaging, recent labs and medical records.   Labs:     Latest Ref Rng & Units 03/08/2023  4:40 PM 04/04/2018    7:11 AM 04/03/2018   10:47 AM  CBC  WBC 4.0 - 10.5 K/uL 8.0  5.1  5.8   Hemoglobin 13.0 - 17.0 g/dL 16.1  09.6  04.5   Hematocrit 39.0 - 52.0 % 48.2  44.2  45.2   Platelets 150 - 400 K/uL 150  155  174       Latest Ref Rng & Units 03/08/2023    4:40 PM 04/05/2018    6:15 AM 04/04/2018    7:11 AM  CMP  Glucose 70 - 99 mg/dL 409  811  914   BUN 8 - 23 mg/dL 17  13  18    Creatinine 0.61 - 1.24 mg/dL 7.82  9.56  2.13   Sodium 135 - 145 mmol/L 138  140  138   Potassium 3.5 - 5.1 mmol/L 3.4  3.4  3.9   Chloride 98 - 111 mmol/L 105  105  104   CO2 22 - 32 mmol/L 25  27  25    Calcium 8.9 - 10.3 mg/dL 8.8  8.9  8.8   Total Protein  6.5 - 8.1 g/dL 7.0     Total Bilirubin 0.3 - 1.2 mg/dL 0.7     Alkaline Phos 38 - 126 U/L 35     AST 15 - 41 U/L 31     ALT 0 - 44 U/L 26         Imaging: Radiological images reviewed:   Within last 24 hrs: No results found.  Assessment    Right inguinal hernia, reducible.  Apparently minimally symptomatic, if not asymptomatic. Patient Active Problem List   Diagnosis Date Noted   Benign essential HTN 02/03/2018   Mild dementia (HCC) 11/25/2017   Mild cognitive impairment 05/27/2017   Loss of memory 02/04/2017   Frequent PVCs 07/02/2016   Palpitations 06/27/2016   Bradycardia 02/22/2016   Mixed hyperlipidemia 05/10/2014    Plan    Would continue observation of this pleasant gentleman, I would defer elective surgery until it is clear it is of marked significance for this pleasantly demented gentleman.  Face-to-face time spent with the patient and accompanying care providers(if present) was 30 minutes, with more than 50% of the time spent counseling, educating, and coordinating care of the patient.    These notes generated with voice recognition software. I apologize for typographical errors.  Campbell Lerner M.D., FACS 04/01/2023, 11:46 AM

## 2023-04-01 NOTE — Patient Instructions (Signed)
Follow-up with our office as needed.  Please call us if you start to develop more pain with the hernia.     Inguinal Hernia, Adult An inguinal hernia develops when fat or the intestines push through a weak spot in a muscle where the leg meets the lower abdomen (groin). This creates a bulge. This kind of hernia could also be: In the scrotum, if you are male. In folds of skin around the vagina, if you are male. There are three types of inguinal hernias: Hernias that can be pushed back into the abdomen (are reducible). This type rarely causes pain. Hernias that are not reducible (are incarcerated). Hernias that are not reducible and lose their blood supply (are strangulated). This type of hernia requires emergency surgery. What are the causes? This condition is caused by having a weak spot in the muscles or tissues in your groin. This develops over time. The hernia may poke through the weak spot when you suddenly strain your lower abdominal muscles, such as when you: Lift a heavy object. Strain to have a bowel movement. Constipation can lead to straining. Cough. What increases the risk? This condition is more likely to develop in: Males. Pregnant females. People who: Are overweight. Work in jobs that require long periods of standing or heavy lifting. Have had an inguinal hernia before. Smoke or have lung disease. These factors can lead to long-term (chronic) coughing. What are the signs or symptoms? Symptoms may depend on the size of the hernia. Often, a small inguinal hernia has no symptoms. Symptoms of a larger hernia may include: A bulge in the groin area. This is easier to see when standing. It might not be visible when lying down. Pain or burning in the groin. This may get worse when lifting, straining, or coughing. A dull ache or a feeling of pressure in the groin. An unusual bulge in the scrotum, in males. Symptoms of a strangulated inguinal hernia may include: A bulge in  your groin that is very painful and tender to the touch. A bulge that turns red or purple. Fever, nausea, and vomiting. Inability to have a bowel movement or to pass gas. How is this diagnosed? This condition is diagnosed based on your symptoms, your medical history, and a physical exam. Your health care provider may feel your groin area and ask you to cough. How is this treated? Treatment depends on the size of your hernia and whether you have symptoms. If you do not have symptoms, your health care provider may have you watch your hernia carefully and have you come in for follow-up visits. If your hernia is large or if you have symptoms, you may need surgery to repair the hernia. Follow these instructions at home: Lifestyle Avoid lifting heavy objects. Avoid standing for long periods of time. Do not use any products that contain nicotine or tobacco. These products include cigarettes, chewing tobacco, and vaping devices, such as e-cigarettes. If you need help quitting, ask your health care provider. Maintain a healthy weight. Preventing constipation You may need to take these actions to prevent or treat constipation: Drink enough fluid to keep your urine pale yellow. Take over-the-counter or prescription medicines. Eat foods that are high in fiber, such as beans, whole grains, and fresh fruits and vegetables. Limit foods that are high in fat and processed sugars, such as fried or sweet foods. General instructions You may try to push the hernia back in place by very gently pressing on it while lying down. Do not  try to force the bulge back in if it will not push in easily. Watch your hernia for any changes in shape, size, or color. Get help right away if you notice any changes. Take over-the-counter and prescription medicines only as told by your health care provider. Keep all follow-up visits. This is important. Contact a health care provider if: You have a fever or chills. You develop new  symptoms. Your symptoms get worse. Get help right away if: You have pain in your groin that suddenly gets worse. You have a bulge in your groin that: Suddenly gets bigger and does not get smaller. Becomes red or purple or painful to the touch. You are a man and you have a sudden pain in your scrotum, or the size of your scrotum suddenly changes. You cannot push the hernia back in place by very gently pressing on it when you are lying down. You have nausea or vomiting that does not go away. You have a fast heartbeat. You cannot have a bowel movement or pass gas. These symptoms may represent a serious problem that is an emergency. Do not wait to see if the symptoms will go away. Get medical help right away. Call your local emergency services (911 in the [place] Summary An inguinal hernia develops when fat or the intestines push through a weak spot in a muscle where your leg meets your lower abdomen (groin). This condition is caused by having a weak spot in muscles or tissues in your groin. Symptoms may depend on the size of the hernia, and they may include pain or swelling in your groin. A small inguinal hernia often has no symptoms. Treatment may not be needed if you do not have symptoms. If you have symptoms or a large hernia, you may need surgery to repair the hernia. Avoid lifting heavy objects. Also, avoid standing for long periods of time. This information is not intended to replace advice given to you by your health care provider. Make sure you discuss any questions you have with your health care provider. Document Revised: 05/23/2020 Document Reviewed: 05/23/2020 Elsevier Patient Education  2024 [employer].

## 2023-04-07 DIAGNOSIS — Z66 Do not resuscitate: Secondary | ICD-10-CM | POA: Diagnosis not present

## 2023-04-07 DIAGNOSIS — K469 Unspecified abdominal hernia without obstruction or gangrene: Secondary | ICD-10-CM | POA: Diagnosis not present

## 2023-04-07 DIAGNOSIS — R600 Localized edema: Secondary | ICD-10-CM | POA: Diagnosis not present

## 2023-04-08 DIAGNOSIS — F411 Generalized anxiety disorder: Secondary | ICD-10-CM | POA: Diagnosis not present

## 2023-04-08 DIAGNOSIS — F4323 Adjustment disorder with mixed anxiety and depressed mood: Secondary | ICD-10-CM | POA: Diagnosis not present

## 2023-04-08 DIAGNOSIS — F5105 Insomnia due to other mental disorder: Secondary | ICD-10-CM | POA: Diagnosis not present

## 2023-04-08 DIAGNOSIS — F331 Major depressive disorder, recurrent, moderate: Secondary | ICD-10-CM | POA: Diagnosis not present

## 2023-04-08 DIAGNOSIS — G309 Alzheimer's disease, unspecified: Secondary | ICD-10-CM | POA: Diagnosis not present

## 2023-04-14 ENCOUNTER — Other Ambulatory Visit: Payer: Self-pay

## 2023-04-14 ENCOUNTER — Emergency Department
Admission: EM | Admit: 2023-04-14 | Discharge: 2023-04-14 | Disposition: A | Discharge status: Home / Self Care | Payer: PPO | Attending: Emergency Medicine | Admitting: Emergency Medicine

## 2023-04-14 ENCOUNTER — Emergency Department: Payer: PPO

## 2023-04-14 DIAGNOSIS — Z79899 Other long term (current) drug therapy: Secondary | ICD-10-CM | POA: Diagnosis not present

## 2023-04-14 DIAGNOSIS — Z743 Need for continuous supervision: Secondary | ICD-10-CM | POA: Diagnosis not present

## 2023-04-14 DIAGNOSIS — W1830XA Fall on same level, unspecified, initial encounter: Secondary | ICD-10-CM | POA: Insufficient documentation

## 2023-04-14 DIAGNOSIS — Z6827 Body mass index (BMI) 27.0-27.9, adult: Secondary | ICD-10-CM | POA: Diagnosis not present

## 2023-04-14 DIAGNOSIS — W19XXXA Unspecified fall, initial encounter: Secondary | ICD-10-CM

## 2023-04-14 DIAGNOSIS — R69 Illness, unspecified: Secondary | ICD-10-CM | POA: Diagnosis not present

## 2023-04-14 DIAGNOSIS — G301 Alzheimer's disease with late onset: Secondary | ICD-10-CM | POA: Diagnosis not present

## 2023-04-14 DIAGNOSIS — T148XXA Other injury of unspecified body region, initial encounter: Secondary | ICD-10-CM

## 2023-04-14 DIAGNOSIS — S50312A Abrasion of left elbow, initial encounter: Secondary | ICD-10-CM | POA: Diagnosis not present

## 2023-04-14 DIAGNOSIS — F0390 Unspecified dementia without behavioral disturbance: Secondary | ICD-10-CM | POA: Diagnosis not present

## 2023-04-14 DIAGNOSIS — S0990XA Unspecified injury of head, initial encounter: Secondary | ICD-10-CM | POA: Diagnosis not present

## 2023-04-14 DIAGNOSIS — M5031 Other cervical disc degeneration,  high cervical region: Secondary | ICD-10-CM | POA: Diagnosis not present

## 2023-04-14 DIAGNOSIS — M47812 Spondylosis without myelopathy or radiculopathy, cervical region: Secondary | ICD-10-CM | POA: Diagnosis not present

## 2023-04-14 DIAGNOSIS — I6523 Occlusion and stenosis of bilateral carotid arteries: Secondary | ICD-10-CM | POA: Diagnosis not present

## 2023-04-14 DIAGNOSIS — I6782 Cerebral ischemia: Secondary | ICD-10-CM | POA: Diagnosis not present

## 2023-04-14 DIAGNOSIS — S0081XA Abrasion of other part of head, initial encounter: Secondary | ICD-10-CM | POA: Diagnosis not present

## 2023-04-14 DIAGNOSIS — E663 Overweight: Secondary | ICD-10-CM | POA: Diagnosis not present

## 2023-04-14 DIAGNOSIS — M5021 Other cervical disc displacement,  high cervical region: Secondary | ICD-10-CM | POA: Diagnosis not present

## 2023-04-14 DIAGNOSIS — Z043 Encounter for examination and observation following other accident: Secondary | ICD-10-CM | POA: Diagnosis not present

## 2023-04-14 LAB — CBC
HCT: 50.4 % (ref 39.0–52.0)
Hemoglobin: 17.2 g/dL — ABNORMAL HIGH (ref 13.0–17.0)
MCH: 30.7 pg (ref 26.0–34.0)
MCHC: 34.1 g/dL (ref 30.0–36.0)
MCV: 89.8 fL (ref 80.0–100.0)
Platelets: 160 10*3/uL (ref 150–400)
RBC: 5.61 MIL/uL (ref 4.22–5.81)
RDW: 12.7 % (ref 11.5–15.5)
WBC: 10.4 10*3/uL (ref 4.0–10.5)
nRBC: 0 % (ref 0.0–0.2)

## 2023-04-14 LAB — BASIC METABOLIC PANEL
Anion gap: 13 (ref 5–15)
BUN: 21 mg/dL (ref 8–23)
CO2: 20 mmol/L — ABNORMAL LOW (ref 22–32)
Calcium: 9.8 mg/dL (ref 8.9–10.3)
Chloride: 106 mmol/L (ref 98–111)
Creatinine, Ser: 1.14 mg/dL (ref 0.61–1.24)
GFR, Estimated: >60 mL/min (ref >60)
Glucose, Bld: 117 mg/dL — ABNORMAL HIGH (ref 70–99)
Potassium: 3.8 mmol/L (ref 3.5–5.1)
Sodium: 139 mmol/L (ref 135–145)

## 2023-04-14 NOTE — ED Provider Notes (Signed)
   [facility] Provider Note    Event Date/Time   First MD Initiated Contact with Patient 04/14/23 2010     (approximate)   History   Fall   HPI  Seth Thompson is a 77 y.o. male with history of dementia who presents for unwitnessed fall, he has abrasions to the arms and forehead.  Otherwise he has no complaints     Physical Exam   Triage Vital Signs: ED Triage Vitals  Enc Vitals Group     BP 04/14/23 1836 (!) 167/90     Pulse Rate 04/14/23 1836 72     Resp 04/14/23 1836 19     Temp 04/14/23 1836 98 F (36.7 C)     Temp src --      SpO2 04/14/23 1836 100 %     Weight --      Height --      Head Circumference --      Peak Flow --      Pain Score 04/14/23 1835 3     Pain Loc --      Pain Edu? --      Excl. in GC? --     Most recent vital signs: Vitals:   04/14/23 1836  BP: (!) 167/90  Pulse: 72  Resp: 19  Temp: 98 F (36.7 C)  SpO2: 100%     General: Awake, no distress.  CV:  Good peripheral perfusion.  Resp:  Normal effort.  Abd:  No distention.  Other:  Abrasion to the left elbow, consistent with skin tear, shallow abrasion to the forehead   ED Results / Procedures / Treatments   Labs (all labs ordered are listed, but only abnormal results are displayed) Labs Reviewed  CBC - Abnormal; Notable for the following components:      Result Value   Hemoglobin 17.2 (*)    All other components within normal limits  BASIC METABOLIC PANEL - Abnormal; Notable for the following components:   CO2 20 (*)    Glucose, Bld 117 (*)    All other components within normal limits     EKG     RADIOLOGY CT head viewed interpreted by me, no acute abnormality    PROCEDURES:  Critical Care performed:   Procedures   MEDICATIONS ORDERED IN ED: Medications - No data to display   IMPRESSION / MDM / ASSESSMENT AND PLAN / ED COURSE  I reviewed the triage vital signs and the nursing notes. Patient's presentation is most  consistent with acute presentation with potential threat to life or bodily function.  Patient presents after fall with head injury.  Differential includes contusion, abrasion, ICH  CT scan of the cervical spine and CT head are reassuring.  Wounds cleaned and dressed, appropriate for discharge at this time    Clinical Course as of 04/14/23 2032  [facility] Apr 14, 2023  1948 CT HEAD WO CONTRAST ( ) [RK]    Clinical Course User Index [RK] Jene Every, MD     FINAL CLINICAL IMPRESSION(S) / ED DIAGNOSES   Final diagnoses:  Fall, initial encounter  Abrasion     Rx / DC Orders   ED Discharge Orders     None        Note:  This document was prepared using Dragon voice recognition software and may include unintentional dictation errors.   Jene Every, MD 04/14/23 2033

## 2023-04-14 NOTE — ED Notes (Signed)
called to [facility] for pt transport to facility/[facility]/rep:[place].

## 2023-04-14 NOTE — ED Triage Notes (Signed)
Pt comes with c/o fall. Pt was wondering in parking lot. Pt has dementia. Pt had fall today at [facility]. Pt has abrasion to left arm and forehead.

## 2023-04-14 NOTE — ED Triage Notes (Signed)
Ems report: pt ems from [facility] for unwitnessed fall. Pt with bruising on head. Pt hx of advanced dementia.   Ems VS 156/74 Hr 67 98 % RA

## 2023-04-17 ENCOUNTER — Other Ambulatory Visit: Payer: Self-pay

## 2023-04-17 DIAGNOSIS — R2681 Unsteadiness on feet: Secondary | ICD-10-CM | POA: Diagnosis not present

## 2023-04-17 DIAGNOSIS — S41102A Unspecified open wound of left upper arm, initial encounter: Secondary | ICD-10-CM | POA: Diagnosis not present

## 2023-04-17 DIAGNOSIS — W19XXXA Unspecified fall, initial encounter: Secondary | ICD-10-CM | POA: Diagnosis not present

## 2023-04-17 DIAGNOSIS — G301 Alzheimer's disease with late onset: Secondary | ICD-10-CM | POA: Diagnosis not present

## 2023-04-22 ENCOUNTER — Ambulatory Visit: Payer: PPO | Admitting: Physician Assistant

## 2023-04-22 DIAGNOSIS — E785 Hyperlipidemia, unspecified: Secondary | ICD-10-CM | POA: Diagnosis not present

## 2023-04-22 DIAGNOSIS — I739 Peripheral vascular disease, unspecified: Secondary | ICD-10-CM | POA: Diagnosis not present

## 2023-04-22 DIAGNOSIS — S41102D Unspecified open wound of left upper arm, subsequent encounter: Secondary | ICD-10-CM | POA: Diagnosis not present

## 2023-04-22 DIAGNOSIS — Z9181 History of falling: Secondary | ICD-10-CM | POA: Diagnosis not present

## 2023-04-22 DIAGNOSIS — Z556 Problems related to health literacy: Secondary | ICD-10-CM | POA: Diagnosis not present

## 2023-04-22 DIAGNOSIS — R32 Unspecified urinary incontinence: Secondary | ICD-10-CM | POA: Diagnosis not present

## 2023-04-22 DIAGNOSIS — F0284 Dementia in other diseases classified elsewhere, unspecified severity, with anxiety: Secondary | ICD-10-CM | POA: Diagnosis not present

## 2023-04-22 DIAGNOSIS — K5790 Diverticulosis of intestine, part unspecified, without perforation or abscess without bleeding: Secondary | ICD-10-CM | POA: Diagnosis not present

## 2023-04-22 DIAGNOSIS — G301 Alzheimer's disease with late onset: Secondary | ICD-10-CM | POA: Diagnosis not present

## 2023-04-22 DIAGNOSIS — E8881 Metabolic syndrome: Secondary | ICD-10-CM | POA: Diagnosis not present

## 2023-04-29 DIAGNOSIS — W19XXXA Unspecified fall, initial encounter: Secondary | ICD-10-CM | POA: Diagnosis not present

## 2023-04-29 DIAGNOSIS — Z515 Encounter for palliative care: Secondary | ICD-10-CM | POA: Diagnosis not present

## 2023-04-29 DIAGNOSIS — N3942 Incontinence without sensory awareness: Secondary | ICD-10-CM | POA: Diagnosis not present

## 2023-04-29 DIAGNOSIS — F03918 Unspecified dementia, unspecified severity, with other behavioral disturbance: Secondary | ICD-10-CM | POA: Diagnosis not present

## 2023-04-29 DIAGNOSIS — Z0000 Encounter for general adult medical examination without abnormal findings: Secondary | ICD-10-CM | POA: Diagnosis not present

## 2023-05-05 DIAGNOSIS — G301 Alzheimer's disease with late onset: Secondary | ICD-10-CM | POA: Diagnosis not present

## 2023-05-05 DIAGNOSIS — I739 Peripheral vascular disease, unspecified: Secondary | ICD-10-CM | POA: Diagnosis not present

## 2023-05-05 DIAGNOSIS — F0284 Dementia in other diseases classified elsewhere, unspecified severity, with anxiety: Secondary | ICD-10-CM | POA: Diagnosis not present

## 2023-05-07 DIAGNOSIS — B351 Tinea unguium: Secondary | ICD-10-CM | POA: Diagnosis not present

## 2023-05-07 DIAGNOSIS — G300 Alzheimer's disease with early onset: Secondary | ICD-10-CM | POA: Diagnosis not present

## 2023-05-07 DIAGNOSIS — R2689 Other abnormalities of gait and mobility: Secondary | ICD-10-CM | POA: Diagnosis not present

## 2023-05-07 DIAGNOSIS — F329 Major depressive disorder, single episode, unspecified: Secondary | ICD-10-CM | POA: Diagnosis not present

## 2023-05-07 DIAGNOSIS — L608 Other nail disorders: Secondary | ICD-10-CM | POA: Diagnosis not present

## 2023-05-07 DIAGNOSIS — M79675 Pain in left toe(s): Secondary | ICD-10-CM | POA: Diagnosis not present

## 2023-05-07 DIAGNOSIS — M79674 Pain in right toe(s): Secondary | ICD-10-CM | POA: Diagnosis not present

## 2023-05-07 DIAGNOSIS — L851 Acquired keratosis [keratoderma] palmaris et plantaris: Secondary | ICD-10-CM | POA: Diagnosis not present

## 2023-05-07 DIAGNOSIS — E7800 Pure hypercholesterolemia, unspecified: Secondary | ICD-10-CM | POA: Diagnosis not present

## 2023-05-08 DIAGNOSIS — F331 Major depressive disorder, recurrent, moderate: Secondary | ICD-10-CM | POA: Diagnosis not present

## 2023-05-08 DIAGNOSIS — G309 Alzheimer's disease, unspecified: Secondary | ICD-10-CM | POA: Diagnosis not present

## 2023-05-08 DIAGNOSIS — F5105 Insomnia due to other mental disorder: Secondary | ICD-10-CM | POA: Diagnosis not present

## 2023-05-08 DIAGNOSIS — F4323 Adjustment disorder with mixed anxiety and depressed mood: Secondary | ICD-10-CM | POA: Diagnosis not present

## 2023-05-08 DIAGNOSIS — F411 Generalized anxiety disorder: Secondary | ICD-10-CM | POA: Diagnosis not present

## 2023-05-12 DIAGNOSIS — G309 Alzheimer's disease, unspecified: Secondary | ICD-10-CM | POA: Diagnosis not present

## 2023-05-12 DIAGNOSIS — E7800 Pure hypercholesterolemia, unspecified: Secondary | ICD-10-CM | POA: Diagnosis not present

## 2023-05-12 DIAGNOSIS — I739 Peripheral vascular disease, unspecified: Secondary | ICD-10-CM | POA: Diagnosis not present

## 2023-05-21 DIAGNOSIS — R2681 Unsteadiness on feet: Secondary | ICD-10-CM | POA: Diagnosis not present

## 2023-05-21 DIAGNOSIS — F419 Anxiety disorder, unspecified: Secondary | ICD-10-CM | POA: Diagnosis not present

## 2023-05-21 DIAGNOSIS — F03918 Unspecified dementia, unspecified severity, with other behavioral disturbance: Secondary | ICD-10-CM | POA: Diagnosis not present

## 2023-05-21 DIAGNOSIS — F989 Unspecified behavioral and emotional disorders with onset usually occurring in childhood and adolescence: Secondary | ICD-10-CM | POA: Diagnosis not present

## 2023-05-21 DIAGNOSIS — Z515 Encounter for palliative care: Secondary | ICD-10-CM | POA: Diagnosis not present

## 2023-06-04 DIAGNOSIS — F329 Major depressive disorder, single episode, unspecified: Secondary | ICD-10-CM | POA: Diagnosis not present

## 2023-06-04 DIAGNOSIS — E7800 Pure hypercholesterolemia, unspecified: Secondary | ICD-10-CM | POA: Diagnosis not present

## 2023-06-05 DIAGNOSIS — F331 Major depressive disorder, recurrent, moderate: Secondary | ICD-10-CM | POA: Diagnosis not present

## 2023-06-05 DIAGNOSIS — F411 Generalized anxiety disorder: Secondary | ICD-10-CM | POA: Diagnosis not present

## 2023-06-05 DIAGNOSIS — F4323 Adjustment disorder with mixed anxiety and depressed mood: Secondary | ICD-10-CM | POA: Diagnosis not present

## 2023-06-05 DIAGNOSIS — F5105 Insomnia due to other mental disorder: Secondary | ICD-10-CM | POA: Diagnosis not present

## 2023-06-05 DIAGNOSIS — G309 Alzheimer's disease, unspecified: Secondary | ICD-10-CM | POA: Diagnosis not present

## 2023-06-09 DIAGNOSIS — R2681 Unsteadiness on feet: Secondary | ICD-10-CM | POA: Diagnosis not present

## 2023-06-09 DIAGNOSIS — R7303 Prediabetes: Secondary | ICD-10-CM | POA: Diagnosis not present

## 2023-06-09 DIAGNOSIS — N182 Chronic kidney disease, stage 2 (mild): Secondary | ICD-10-CM | POA: Diagnosis not present

## 2023-06-09 DIAGNOSIS — E8881 Metabolic syndrome: Secondary | ICD-10-CM | POA: Diagnosis not present

## 2023-06-09 DIAGNOSIS — E7800 Pure hypercholesterolemia, unspecified: Secondary | ICD-10-CM | POA: Diagnosis not present

## 2023-06-09 DIAGNOSIS — G309 Alzheimer's disease, unspecified: Secondary | ICD-10-CM | POA: Diagnosis not present

## 2023-06-09 DIAGNOSIS — E663 Overweight: Secondary | ICD-10-CM | POA: Diagnosis not present

## 2023-06-11 DIAGNOSIS — N1831 Chronic kidney disease, stage 3a: Secondary | ICD-10-CM | POA: Diagnosis not present

## 2023-06-15 NOTE — Progress Notes (Unsigned)
Celso Amy, PA-C [address]  [address]  [place], [place] [address]  Main: [phone]  Fax: [phone]   Gastroenterology Consultation  Referring Provider:     Nira Retort Primary Care Physician:  Nira Retort Primary Gastroenterologist:  Celso Amy, PA-C / Dr. Wyline Mood   Reason for Consultation:     Abnormal CT, schedule colonoscopy        HPI:   Seth Thompson is a 77 y.o. y/o male referred for consultation & management  by Nira Retort.    Patient has severe dementia.  He lives at [facility].  Here with a caregiver, Pete Glatter.  He saw general surgeon 03/2023 to evaluate noncomplicated direct right inguinal hernia.  Surgery was not recommended due to patient's severe dementia and advanced age.  Patient is not alert to time, person or place.  Abdominal pelvic CT 03/2023 showed moderate to large right inguinal hernia containing noninflamed small bowel, diverticulosis with no diverticulitis, segment of transverse colon wall thickening versus nondistention spanning 3.3 cm.  Up dating a colonoscopy was recommended.    With review of records I do not see where he has ever had a colonoscopy.  He takes senna and MiraLAX daily with good control of constipation.  There has been no visible rectal bleeding episodes per caregiver.  Patient is not able to provide any history.  Labs 04/14/2023 showed normal CBC with hemoglobin 17.2.  BUN 21, creatinine 1.14.   Past Medical History:  Diagnosis Date   Benign essential HTN 02/03/2018   Dementia (HCC)    Diverticulitis of large intestine with abscess without bleeding 04/03/2018   Frequent PVCs 07/02/2016   Hyperlipidemia    Right inguinal hernia 04/01/2023    Past Surgical History:  Procedure Laterality Date   CHOLECYSTECTOMY      Prior to Admission medications   Medication Sig Start Date End Date Taking? Authorizing Provider  azelastine (ASTELIN) 0.1 % nasal spray 1 2 PUFF IN EACH  NOSTRIL TWICE A DAY NASALLY 90 09/24/19   [provider]  cyanocobalamin (VITAMIN B12) 1000 MCG tablet Take 1,000 mcg by mouth daily.    [provider]  melatonin 5 MG TABS Take 5 mg by mouth.    [provider]  MUCINEX FAST-MAX DM MAX 20-400 MG/20ML LIQD Take 10 mLs by mouth every 4 (four) hours as needed. 11/26/22   [provider]  Multiple Vitamin (MULTIVITAMIN) capsule Take 1 capsule by mouth daily.    [provider]  polyethylene glycol (MIRALAX / GLYCOLAX) 17 g packet Take 17 g by mouth daily.    [provider]  senna (SENOKOT) 8.6 MG TABS tablet Take 1 tablet by mouth.    [provider]  sertraline (ZOLOFT) 50 MG tablet Take 50 mg by mouth at bedtime.    [provider]    History reviewed. No pertinent family history.   Social History   Tobacco Use   Smoking status: Never    Passive exposure: Never   Smokeless tobacco: Never  Vaping Use   Vaping status: Never Used  Substance Use Topics   Alcohol use: Not Currently   Drug use: Not Currently    Allergies as of 06/16/2023 - Review Complete 06/16/2023  Allergen Reaction Noted   Codeine Other (See Comments) 05/04/2014    Review of Systems:    All systems reviewed and negative except where noted in HPI.   Physical Exam:  BP 122/74   Pulse (!) 57  Temp (!) 95.6 F (35.3 C)   Ht 5\' 10"  (1.778 m)   Wt 174 lb (78.9 kg)   BMI 24.97 kg/m  No LMP for male patient.  General:   Alert,  Well-developed, well-nourished, pleasant and cooperative in NAD; Lungs:  Respirations even and unlabored.  Clear throughout to auscultation.   No wheezes, crackles, or rhonchi. No acute distress. Heart:  Regular rate and rhythm; no murmurs, clicks, rubs, or gallops. Abdomen:  Normal bowel sounds.  No bruits.  Soft, and non-distended without masses, hepatosplenomegaly or hernias noted.  No Tenderness.  No guarding or rebound tenderness.   Large right inguinal hernia  present which is not tender. Neurologic:  Not oriented to time, person, or place.  Grossly normal neurologically.  He walks well with no assistive devices.  He is able to get on and off exam table with our help. Psych:  Mildly agitated mood and affect.  Moderate memory impairment.  Severe dementia.  Imaging Studies: No results found.  Assessment and Plan:   EAMES CERROS is a 77 y.o. y/o male has been referred for:   1.  Abnormal CT scan of the colon -thickening in the transverse colon  Scheduling Colonoscopy I discussed risks of colonoscopy with patient to include risk of bleeding, colon perforation, and risk of sedation.  Patient expressed understanding and agrees to proceed with colonoscopy.   2.  Right inguinal hernia  He recently saw general surgeon and surgery was not recommended.  3.  Severe dementia  He has a caregiver Len Blalock) who will be helping him with the colonoscopy prep at [facility] house.  Follow up as needed based on colonoscopy results.  Celso Amy, PA-C

## 2023-06-16 ENCOUNTER — Encounter: Payer: Self-pay | Admitting: Physician Assistant

## 2023-06-16 ENCOUNTER — Ambulatory Visit (INDEPENDENT_AMBULATORY_CARE_PROVIDER_SITE_OTHER): Payer: PPO | Admitting: Physician Assistant

## 2023-06-16 VITALS — BP 122/74 | HR 57 | Temp 95.6°F | Ht 70.0 in | Wt 174.0 lb

## 2023-06-16 DIAGNOSIS — R933 Abnormal findings on diagnostic imaging of other parts of digestive tract: Secondary | ICD-10-CM | POA: Diagnosis not present

## 2023-06-16 DIAGNOSIS — F03C0 Unspecified dementia, severe, without behavioral disturbance, psychotic disturbance, mood disturbance, and anxiety: Secondary | ICD-10-CM

## 2023-06-16 DIAGNOSIS — F02C18 Dementia in other diseases classified elsewhere, severe, with other behavioral disturbance: Secondary | ICD-10-CM

## 2023-06-16 DIAGNOSIS — K4090 Unilateral inguinal hernia, without obstruction or gangrene, not specified as recurrent: Secondary | ICD-10-CM | POA: Diagnosis not present

## 2023-06-16 MED ORDER — PEG 3350-KCL-NA BICARB-NACL 420 G PO SOLR: 4000.0000 mL | Freq: Once | ORAL | 0 refills | Status: AC

## 2023-06-20 DIAGNOSIS — I739 Peripheral vascular disease, unspecified: Secondary | ICD-10-CM | POA: Diagnosis not present

## 2023-06-20 DIAGNOSIS — M6281 Muscle weakness (generalized): Secondary | ICD-10-CM | POA: Diagnosis not present

## 2023-06-20 DIAGNOSIS — K5900 Constipation, unspecified: Secondary | ICD-10-CM | POA: Diagnosis not present

## 2023-06-20 DIAGNOSIS — F0390 Unspecified dementia without behavioral disturbance: Secondary | ICD-10-CM | POA: Diagnosis not present

## 2023-06-20 DIAGNOSIS — G4700 Insomnia, unspecified: Secondary | ICD-10-CM | POA: Diagnosis not present

## 2023-06-20 DIAGNOSIS — E785 Hyperlipidemia, unspecified: Secondary | ICD-10-CM | POA: Diagnosis not present

## 2023-06-25 DIAGNOSIS — Z515 Encounter for palliative care: Secondary | ICD-10-CM | POA: Diagnosis not present

## 2023-06-25 DIAGNOSIS — F03C0 Unspecified dementia, severe, without behavioral disturbance, psychotic disturbance, mood disturbance, and anxiety: Secondary | ICD-10-CM | POA: Diagnosis not present

## 2023-06-25 DIAGNOSIS — R269 Unspecified abnormalities of gait and mobility: Secondary | ICD-10-CM | POA: Diagnosis not present

## 2023-06-26 DIAGNOSIS — F331 Major depressive disorder, recurrent, moderate: Secondary | ICD-10-CM | POA: Diagnosis not present

## 2023-06-26 DIAGNOSIS — G301 Alzheimer's disease with late onset: Secondary | ICD-10-CM | POA: Diagnosis not present

## 2023-06-26 DIAGNOSIS — F5105 Insomnia due to other mental disorder: Secondary | ICD-10-CM | POA: Diagnosis not present

## 2023-06-26 DIAGNOSIS — F03C0 Unspecified dementia, severe, without behavioral disturbance, psychotic disturbance, mood disturbance, and anxiety: Secondary | ICD-10-CM | POA: Diagnosis not present

## 2023-06-26 DIAGNOSIS — F411 Generalized anxiety disorder: Secondary | ICD-10-CM | POA: Diagnosis not present

## 2023-06-30 DIAGNOSIS — F329 Major depressive disorder, single episode, unspecified: Secondary | ICD-10-CM | POA: Diagnosis not present

## 2023-06-30 DIAGNOSIS — E7800 Pure hypercholesterolemia, unspecified: Secondary | ICD-10-CM | POA: Diagnosis not present

## 2023-07-07 DIAGNOSIS — Z515 Encounter for palliative care: Secondary | ICD-10-CM | POA: Diagnosis not present

## 2023-07-07 DIAGNOSIS — R2681 Unsteadiness on feet: Secondary | ICD-10-CM | POA: Diagnosis not present

## 2023-07-07 DIAGNOSIS — G309 Alzheimer's disease, unspecified: Secondary | ICD-10-CM | POA: Diagnosis not present

## 2023-07-08 DIAGNOSIS — M79674 Pain in right toe(s): Secondary | ICD-10-CM | POA: Diagnosis not present

## 2023-07-08 DIAGNOSIS — L84 Corns and callosities: Secondary | ICD-10-CM | POA: Diagnosis not present

## 2023-07-08 DIAGNOSIS — F0390 Unspecified dementia without behavioral disturbance: Secondary | ICD-10-CM | POA: Diagnosis not present

## 2023-07-08 DIAGNOSIS — M79675 Pain in left toe(s): Secondary | ICD-10-CM | POA: Diagnosis not present

## 2023-07-08 DIAGNOSIS — R2689 Other abnormalities of gait and mobility: Secondary | ICD-10-CM | POA: Diagnosis not present

## 2023-07-08 DIAGNOSIS — B351 Tinea unguium: Secondary | ICD-10-CM | POA: Diagnosis not present

## 2023-07-08 DIAGNOSIS — L603 Nail dystrophy: Secondary | ICD-10-CM | POA: Diagnosis not present

## 2023-07-10 DIAGNOSIS — Z515 Encounter for palliative care: Secondary | ICD-10-CM | POA: Diagnosis not present

## 2023-07-10 DIAGNOSIS — F0390 Unspecified dementia without behavioral disturbance: Secondary | ICD-10-CM | POA: Diagnosis not present

## 2023-07-23 ENCOUNTER — Encounter: Payer: Self-pay | Admitting: Gastroenterology

## 2023-07-24 ENCOUNTER — Encounter
Admission: RE | Disposition: A | Discharge status: Home / Self Care | Payer: Self-pay | Source: Home / Self Care | Attending: Gastroenterology

## 2023-07-24 ENCOUNTER — Encounter: Payer: Self-pay | Admitting: Registered Nurse

## 2023-07-24 ENCOUNTER — Telehealth: Payer: Self-pay

## 2023-07-24 ENCOUNTER — Telehealth: Payer: Self-pay | Admitting: Physician Assistant

## 2023-07-24 ENCOUNTER — Other Ambulatory Visit: Payer: Self-pay

## 2023-07-24 ENCOUNTER — Ambulatory Visit
Admission: RE | Admit: 2023-07-24 | Discharge: 2023-07-24 | Disposition: A | Discharge status: Home / Self Care | Payer: PPO | Attending: Gastroenterology | Admitting: Gastroenterology

## 2023-07-24 DIAGNOSIS — R933 Abnormal findings on diagnostic imaging of other parts of digestive tract: Secondary | ICD-10-CM | POA: Diagnosis not present

## 2023-07-24 DIAGNOSIS — Z538 Procedure and treatment not carried out for other reasons: Secondary | ICD-10-CM | POA: Diagnosis not present

## 2023-07-24 HISTORY — PX: COLONOSCOPY WITH PROPOFOL: SHX5780

## 2023-07-24 SURGERY — COLONOSCOPY WITH PROPOFOL
Anesthesia: General

## 2023-07-24 MED ORDER — LIDOCAINE HCL (PF) 2 % IJ SOLN
INTRAMUSCULAR | Status: AC
  Filled 2023-07-24: qty 5

## 2023-07-24 MED ORDER — PROPOFOL 1000 MG/100ML IV EMUL
INTRAVENOUS | Status: AC
  Filled 2023-07-24: qty 100

## 2023-07-24 MED ORDER — NA SULFATE-K SULFATE-MG SULF 17.5-3.13-1.6 GM/177ML PO SOLN: 354.0000 mL | Freq: Once | ORAL | 0 refills | Status: AC

## 2023-07-24 NOTE — Telephone Encounter (Signed)
Per Dava with Endo above pt FROM [facility]. came in for colon but did not get prep or instructions. please call nurses in charge at [phone] to reschedule. prep needs to called into [facility] AT [phone]. THEIR FAX NUMBER IS [phone] AT PHARMACY. PT IS A WARD OF STATE. DSS AGENT CANDACE GOBBLE [phone]. THANKS Called [facility] and got patient reschedule to 08/20/2023. They said to fax the instructions to [phone]. Sent prep to the pharmacy

## 2023-07-24 NOTE — OR Nursing (Signed)
PT CAME TO FACILITY FROM [facility]. A WARD OF THE STATE . DSS CANDACE COBBLE GAVE APPROVAL FOR PROCEDURE. HOWEVER PT DID NOT PREP OR RECEIVE PREP. PROCEDURE CANCELLED TODAY AND BE RE-SCHEDULED. OFFICE AND DR Tobi Bastos INFORMED

## 2023-07-24 NOTE — Telephone Encounter (Signed)
Reshawn Joe called in from DSS. The patient prep was not done, and the patient had to reschedule the patient appointment.

## 2023-07-25 ENCOUNTER — Encounter: Payer: Self-pay | Admitting: Gastroenterology

## 2023-07-27 NOTE — Plan of Care (Signed)
[facility] Tonsillectomy/Adenoidectomy, Postoperative PEDS care plan entered in error.

## 2023-07-29 DIAGNOSIS — F0390 Unspecified dementia without behavioral disturbance: Secondary | ICD-10-CM | POA: Diagnosis not present

## 2023-07-29 DIAGNOSIS — Z515 Encounter for palliative care: Secondary | ICD-10-CM | POA: Diagnosis not present

## 2023-08-04 DIAGNOSIS — R269 Unspecified abnormalities of gait and mobility: Secondary | ICD-10-CM | POA: Diagnosis not present

## 2023-08-04 DIAGNOSIS — K469 Unspecified abdominal hernia without obstruction or gangrene: Secondary | ICD-10-CM | POA: Diagnosis not present

## 2023-08-04 DIAGNOSIS — R634 Abnormal weight loss: Secondary | ICD-10-CM | POA: Diagnosis not present

## 2023-08-04 DIAGNOSIS — E663 Overweight: Secondary | ICD-10-CM | POA: Diagnosis not present

## 2023-08-04 DIAGNOSIS — R32 Unspecified urinary incontinence: Secondary | ICD-10-CM | POA: Diagnosis not present

## 2023-08-04 DIAGNOSIS — K5790 Diverticulosis of intestine, part unspecified, without perforation or abscess without bleeding: Secondary | ICD-10-CM | POA: Diagnosis not present

## 2023-08-04 DIAGNOSIS — K5900 Constipation, unspecified: Secondary | ICD-10-CM | POA: Diagnosis not present

## 2023-08-04 DIAGNOSIS — F02B4 Dementia in other diseases classified elsewhere, moderate, with anxiety: Secondary | ICD-10-CM | POA: Diagnosis not present

## 2023-08-04 DIAGNOSIS — Z6826 Body mass index (BMI) 26.0-26.9, adult: Secondary | ICD-10-CM | POA: Diagnosis not present

## 2023-08-04 DIAGNOSIS — G309 Alzheimer's disease, unspecified: Secondary | ICD-10-CM | POA: Diagnosis not present

## 2023-08-06 DIAGNOSIS — E7800 Pure hypercholesterolemia, unspecified: Secondary | ICD-10-CM | POA: Diagnosis not present

## 2023-08-06 DIAGNOSIS — F329 Major depressive disorder, single episode, unspecified: Secondary | ICD-10-CM | POA: Diagnosis not present

## 2023-08-10 DIAGNOSIS — N182 Chronic kidney disease, stage 2 (mild): Secondary | ICD-10-CM | POA: Diagnosis not present

## 2023-08-20 ENCOUNTER — Telehealth: Payer: Self-pay

## 2023-08-20 ENCOUNTER — Encounter: Payer: Self-pay | Admitting: Anesthesiology

## 2023-08-20 ENCOUNTER — Encounter
Admission: RE | Disposition: A | Discharge status: Skilled Nursing Facility | Payer: Self-pay | Source: Home / Self Care | Attending: Gastroenterology

## 2023-08-20 ENCOUNTER — Ambulatory Visit
Admission: RE | Admit: 2023-08-20 | Discharge: 2023-08-20 | Disposition: A | Discharge status: Skilled Nursing Facility | Payer: PPO | Attending: Gastroenterology | Admitting: Gastroenterology

## 2023-08-20 DIAGNOSIS — R933 Abnormal findings on diagnostic imaging of other parts of digestive tract: Secondary | ICD-10-CM | POA: Diagnosis not present

## 2023-08-20 DIAGNOSIS — Z538 Procedure and treatment not carried out for other reasons: Secondary | ICD-10-CM | POA: Diagnosis not present

## 2023-08-20 DIAGNOSIS — F0390 Unspecified dementia without behavioral disturbance: Secondary | ICD-10-CM | POA: Insufficient documentation

## 2023-08-20 HISTORY — PX: COLONOSCOPY WITH PROPOFOL: SHX5780

## 2023-08-20 SURGERY — COLONOSCOPY WITH PROPOFOL
Anesthesia: General

## 2023-08-20 NOTE — Telephone Encounter (Signed)
Message from Dr. Tobi Bastos via secure chat: Pt with advanced dementia was set up for a colonoscopy by [facility] for abnormal CT - patient has no clue where he is or why its done- I felt risk of procedure greater than benefit and cancelled it. Would suggest office visit with legal guardian who makes decisions for the patient to discuss further.   I then called DSS (Mrs. Denny Levy Joe-Patient's guardian) and left her a voicemail to call me back.

## 2023-08-20 NOTE — Progress Notes (Signed)
The patient was here for colonoscopy due to abnormal CAT scan at indication.  The patient has advanced dementia not oriented to time place or person but he does not appear to be in discomfort does not complain of admitting abdominal discomfort.  He was here with his caregiver.  Based on his overall degree of dementia I felt the risks of the procedure outweighed the benefits in short and long-term.  I canceled the procedure.  I suggested we have a discussion at my office.  I will explain to the guardian the benefits versus the risks and we can determine if it is an overall his best interest to do any further procedures associated with anesthesia.  My office has been informed and will help schedule an office visit   Dr Wyline Mood MD,MRCP [facility]) Gastroenterology/Hepatology Pager: [phone]

## 2023-08-20 NOTE — OR Nursing (Signed)
The colonoscopy procedure for this patient has been cancelled per Dr Tobi Bastos.  The patient was only oriented to himself, and disoriented to place, time and situation.  Patient does not appear to be any physical distress at this time.

## 2023-08-21 ENCOUNTER — Encounter: Payer: Self-pay | Admitting: Gastroenterology

## 2023-08-21 DIAGNOSIS — F0390 Unspecified dementia without behavioral disturbance: Secondary | ICD-10-CM | POA: Diagnosis not present

## 2023-08-21 DIAGNOSIS — F5105 Insomnia due to other mental disorder: Secondary | ICD-10-CM | POA: Diagnosis not present

## 2023-08-21 DIAGNOSIS — F331 Major depressive disorder, recurrent, moderate: Secondary | ICD-10-CM | POA: Diagnosis not present

## 2023-08-21 DIAGNOSIS — F411 Generalized anxiety disorder: Secondary | ICD-10-CM | POA: Diagnosis not present

## 2023-08-21 DIAGNOSIS — G301 Alzheimer's disease with late onset: Secondary | ICD-10-CM | POA: Diagnosis not present

## 2023-08-21 NOTE — Telephone Encounter (Signed)
Mrs. Seth Thompson Caregiver: [phone] cell    [phone] work Called Toniann Fail back and had to leave her a detailed message with Dr. Johnney Killian recommendations. I left her my phone number to call me back to let me know what she would like to do.

## 2023-08-21 NOTE — Telephone Encounter (Signed)
DSS personnel-Seth Thompson called stating that she heard my message wanting to schedule an appointment for the patient. However, she stated that the legal caregiver for Seth Thompson is the DSS Director as she is for all of their patients. Mrs. Seth Thompson stated that she could come with the patient to an appointment and guide him on what needed to be done in reference to a procedure but she stated that by the time they step out of the room, Seth Thompson will forget everything and he would not have any recollection of what just happened. She stated that unfortunately, his dementia is advanced and it gets hard to manage. Mrs. Seth Thompson also stated that they had a hard time with him trying to prep for his procedure. At the end of the conversation, she stated that she would wait for your recommendation but that she doesn't know how an office visit would help this situation. Please advise.

## 2023-08-22 NOTE — Telephone Encounter (Signed)
Called Mrs. Sandrea Hughs back and let her know that Dr. Tobi Bastos would be able to see her on 09/08/2023 at 2:30 PM to discuss patient's health and recommendations. Mrs. Melvyn Neth stated that it could be either herself or Reshawn (DSS Caregiver) coming to that appointment.

## 2023-08-28 DIAGNOSIS — Z515 Encounter for palliative care: Secondary | ICD-10-CM | POA: Diagnosis not present

## 2023-08-28 DIAGNOSIS — F324 Major depressive disorder, single episode, in partial remission: Secondary | ICD-10-CM | POA: Diagnosis not present

## 2023-08-28 DIAGNOSIS — F0390 Unspecified dementia without behavioral disturbance: Secondary | ICD-10-CM | POA: Diagnosis not present

## 2023-09-01 DIAGNOSIS — F419 Anxiety disorder, unspecified: Secondary | ICD-10-CM | POA: Diagnosis not present

## 2023-09-01 DIAGNOSIS — F02B4 Dementia in other diseases classified elsewhere, moderate, with anxiety: Secondary | ICD-10-CM | POA: Diagnosis not present

## 2023-09-01 DIAGNOSIS — G309 Alzheimer's disease, unspecified: Secondary | ICD-10-CM | POA: Diagnosis not present

## 2023-09-01 DIAGNOSIS — K5900 Constipation, unspecified: Secondary | ICD-10-CM | POA: Diagnosis not present

## 2023-09-03 DIAGNOSIS — E7800 Pure hypercholesterolemia, unspecified: Secondary | ICD-10-CM | POA: Diagnosis not present

## 2023-09-03 DIAGNOSIS — F329 Major depressive disorder, single episode, unspecified: Secondary | ICD-10-CM | POA: Diagnosis not present

## 2023-09-08 ENCOUNTER — Ambulatory Visit: Payer: PPO | Admitting: Gastroenterology

## 2023-09-08 DIAGNOSIS — G309 Alzheimer's disease, unspecified: Secondary | ICD-10-CM | POA: Diagnosis not present

## 2023-09-08 DIAGNOSIS — F02C4 Dementia in other diseases classified elsewhere, severe, with anxiety: Secondary | ICD-10-CM | POA: Diagnosis not present

## 2023-09-08 DIAGNOSIS — W19XXXA Unspecified fall, initial encounter: Secondary | ICD-10-CM | POA: Diagnosis not present

## 2023-09-08 DIAGNOSIS — R2681 Unsteadiness on feet: Secondary | ICD-10-CM | POA: Diagnosis not present

## 2023-09-15 ENCOUNTER — Ambulatory Visit (INDEPENDENT_AMBULATORY_CARE_PROVIDER_SITE_OTHER): Payer: PPO | Admitting: Gastroenterology

## 2023-09-15 DIAGNOSIS — R933 Abnormal findings on diagnostic imaging of other parts of digestive tract: Secondary | ICD-10-CM

## 2023-09-15 NOTE — Progress Notes (Signed)
Wyline Mood MD, MRCP(U.K) [address]  [address]  [place], [place] [address]  Main: [phone]  Fax: [phone]   Gastroenterology Consultation  Referring Provider:     Nira Retort Primary Care Physician:  Nira Retort Primary Gastroenterologist:  Dr. Wyline Mood  Reason for Consultation:     abnormal CT scan of abdomen         HPI:   Seth Thompson is a 77 y.o. y/o male referred for consultation & management  by Nira Retort.     03/08/2023: Ct abdomen : Segment of transverse colonic wall thickening versus nondistention spanning approximately 3.3 cm. Recommend up-to-date colonoscopy to exclude underlying colonic neoplasm.  His guardian is here today to speak to me about the further plan.  He apparently has advanced dementia and requires assistance for activities of daily living.  Completely dependent on care.  He is unable to complete a bowel prep.  Can be combative can have.'s of anxiety.   Past Medical History:  Diagnosis Date   Benign essential HTN 02/03/2018   Dementia (HCC)    Diverticulitis of large intestine with abscess without bleeding 04/03/2018   Frequent PVCs 07/02/2016   Hyperlipidemia    Right inguinal hernia 04/01/2023    Past Surgical History:  Procedure Laterality Date   CHOLECYSTECTOMY     COLONOSCOPY WITH PROPOFOL N/A 07/24/2023   Procedure: COLONOSCOPY WITH PROPOFOL;  Surgeon: Wyline Mood, MD;  Location: [facility] ENDOSCOPY;  Service: Gastroenterology;  Laterality: N/A;   COLONOSCOPY WITH PROPOFOL N/A 08/20/2023   Procedure: COLONOSCOPY WITH PROPOFOL;  Surgeon: Wyline Mood, MD;  Location: [facility] ENDOSCOPY;  Service: Gastroenterology;  Laterality: N/A;    Prior to Admission medications   Medication Sig Start Date End Date Taking? Authorizing Provider  melatonin 5 MG TABS Take 5 mg by mouth.    [provider]  Multiple Vitamin (MULTIVITAMIN) capsule Take 1 capsule by mouth daily.    [provider]   senna (SENOKOT) 8.6 MG TABS tablet Take 1 tablet by mouth.    [provider]  sertraline (ZOLOFT) 50 MG tablet Take 50 mg by mouth at bedtime.    [provider]    No family history on file.   Social History   Tobacco Use   Smoking status: Never    Passive exposure: Never   Smokeless tobacco: Never  Vaping Use   Vaping status: Never Used  Substance Use Topics   Alcohol use: Not Currently   Drug use: Not Currently    Allergies as of 09/15/2023 - Review Complete 08/13/2023  Allergen Reaction Noted   Codeine Other (See Comments) 05/04/2014    Review of Systems:    All systems reviewed and negative except where noted in HPI.   Physical Exam:  Patient not here today only guardian available  Imaging Studies: No results found.  Assessment and Plan:   Seth Thompson is a 77 y.o. y/o male was found to have an abnormal appearing CT scan of the abdomen with possibly a mass in the transverse colon.  He had been referred for a colonoscopy but he suffers from abdominal dementia and is unable to perform a bowel prep.  He requires assistance for all activities of daily living, cannot do anything for himself, can be combative, has issues with anxiety at times.  His guardian was here today to discuss next steps.  I clearly explained that it would be technically not possible to do a colonoscopy unless he takes a prep  and it may be highly impossible for him to drink a gallon of bowel prep to get cleaned out.  Rather it may be a risk for serious aspiration and may cause pneumonia or could even lead to death.  Even if we did find a cancer in his colon he may not be a surgical candidate due to his advanced dementia and it may not change his eventual duration of life or quality of life.  My recommendations would be that given the options would be to avoid any invasive procedures and concentrate on comfort measures for him.  She will discuss with her boss and come up with the plan  and let us know of their decision.  Follow up as needed  Dr Wyline Mood MD,MRCP(U.K)

## 2023-09-22 DIAGNOSIS — F0390 Unspecified dementia without behavioral disturbance: Secondary | ICD-10-CM | POA: Diagnosis not present

## 2023-09-22 DIAGNOSIS — M79675 Pain in left toe(s): Secondary | ICD-10-CM | POA: Diagnosis not present

## 2023-09-22 DIAGNOSIS — G300 Alzheimer's disease with early onset: Secondary | ICD-10-CM | POA: Diagnosis not present

## 2023-09-22 DIAGNOSIS — R2689 Other abnormalities of gait and mobility: Secondary | ICD-10-CM | POA: Diagnosis not present

## 2023-09-22 DIAGNOSIS — M79674 Pain in right toe(s): Secondary | ICD-10-CM | POA: Diagnosis not present

## 2023-09-22 DIAGNOSIS — L851 Acquired keratosis [keratoderma] palmaris et plantaris: Secondary | ICD-10-CM | POA: Diagnosis not present

## 2023-09-22 DIAGNOSIS — L603 Nail dystrophy: Secondary | ICD-10-CM | POA: Diagnosis not present

## 2023-09-24 DIAGNOSIS — F329 Major depressive disorder, single episode, unspecified: Secondary | ICD-10-CM | POA: Diagnosis not present

## 2023-09-24 DIAGNOSIS — E7800 Pure hypercholesterolemia, unspecified: Secondary | ICD-10-CM | POA: Diagnosis not present

## 2023-09-25 DIAGNOSIS — F411 Generalized anxiety disorder: Secondary | ICD-10-CM | POA: Diagnosis not present

## 2023-09-25 DIAGNOSIS — G301 Alzheimer's disease with late onset: Secondary | ICD-10-CM | POA: Diagnosis not present

## 2023-09-25 DIAGNOSIS — F331 Major depressive disorder, recurrent, moderate: Secondary | ICD-10-CM | POA: Diagnosis not present

## 2023-09-25 DIAGNOSIS — F02B4 Dementia in other diseases classified elsewhere, moderate, with anxiety: Secondary | ICD-10-CM | POA: Diagnosis not present

## 2023-09-25 DIAGNOSIS — F5105 Insomnia due to other mental disorder: Secondary | ICD-10-CM | POA: Diagnosis not present

## 2023-10-13 NOTE — Telephone Encounter (Signed)
 Mrs. Sari Shire Caregiver: [phone] cell    [phone] work    Mrs. Sari Kerns called wanting to know if we could fax her the office visit she had with Dr. Therisa so she could keep in patient's chart. She provided her fax number: [phone]. It was faxed and received confirmation.

## 2023-10-23 DIAGNOSIS — F411 Generalized anxiety disorder: Secondary | ICD-10-CM | POA: Diagnosis not present

## 2023-10-23 DIAGNOSIS — F02B4 Dementia in other diseases classified elsewhere, moderate, with anxiety: Secondary | ICD-10-CM | POA: Diagnosis not present

## 2023-10-23 DIAGNOSIS — F5105 Insomnia due to other mental disorder: Secondary | ICD-10-CM | POA: Diagnosis not present

## 2023-10-23 DIAGNOSIS — G301 Alzheimer's disease with late onset: Secondary | ICD-10-CM | POA: Diagnosis not present

## 2023-10-23 DIAGNOSIS — F331 Major depressive disorder, recurrent, moderate: Secondary | ICD-10-CM | POA: Diagnosis not present

## 2023-10-25 DIAGNOSIS — G309 Alzheimer's disease, unspecified: Secondary | ICD-10-CM | POA: Diagnosis not present

## 2023-10-25 DIAGNOSIS — Z6825 Body mass index (BMI) 25.0-25.9, adult: Secondary | ICD-10-CM | POA: Diagnosis not present

## 2023-10-25 DIAGNOSIS — R269 Unspecified abnormalities of gait and mobility: Secondary | ICD-10-CM | POA: Diagnosis not present

## 2023-10-25 DIAGNOSIS — F02B4 Dementia in other diseases classified elsewhere, moderate, with anxiety: Secondary | ICD-10-CM | POA: Diagnosis not present

## 2023-10-25 DIAGNOSIS — E663 Overweight: Secondary | ICD-10-CM | POA: Diagnosis not present

## 2023-10-25 DIAGNOSIS — Z79899 Other long term (current) drug therapy: Secondary | ICD-10-CM | POA: Diagnosis not present

## 2023-10-25 DIAGNOSIS — Z7189 Other specified counseling: Secondary | ICD-10-CM | POA: Diagnosis not present

## 2023-10-30 DIAGNOSIS — I10 Essential (primary) hypertension: Secondary | ICD-10-CM | POA: Diagnosis not present

## 2023-10-30 DIAGNOSIS — N182 Chronic kidney disease, stage 2 (mild): Secondary | ICD-10-CM | POA: Diagnosis not present

## 2023-11-12 DIAGNOSIS — Z515 Encounter for palliative care: Secondary | ICD-10-CM | POA: Diagnosis not present

## 2023-11-12 DIAGNOSIS — F02B4 Dementia in other diseases classified elsewhere, moderate, with anxiety: Secondary | ICD-10-CM | POA: Diagnosis not present

## 2023-11-12 DIAGNOSIS — I739 Peripheral vascular disease, unspecified: Secondary | ICD-10-CM | POA: Diagnosis not present

## 2023-11-21 DIAGNOSIS — I739 Peripheral vascular disease, unspecified: Secondary | ICD-10-CM | POA: Diagnosis not present

## 2023-11-21 DIAGNOSIS — F0390 Unspecified dementia without behavioral disturbance: Secondary | ICD-10-CM | POA: Diagnosis not present

## 2023-11-21 DIAGNOSIS — K5900 Constipation, unspecified: Secondary | ICD-10-CM | POA: Diagnosis not present

## 2023-11-21 DIAGNOSIS — M6281 Muscle weakness (generalized): Secondary | ICD-10-CM | POA: Diagnosis not present

## 2023-11-21 DIAGNOSIS — G4700 Insomnia, unspecified: Secondary | ICD-10-CM | POA: Diagnosis not present

## 2023-11-24 DIAGNOSIS — E782 Mixed hyperlipidemia: Secondary | ICD-10-CM | POA: Diagnosis not present

## 2023-11-24 DIAGNOSIS — W19XXXA Unspecified fall, initial encounter: Secondary | ICD-10-CM | POA: Diagnosis not present

## 2023-11-24 DIAGNOSIS — J069 Acute upper respiratory infection, unspecified: Secondary | ICD-10-CM | POA: Diagnosis not present

## 2023-11-24 DIAGNOSIS — I739 Peripheral vascular disease, unspecified: Secondary | ICD-10-CM | POA: Diagnosis not present

## 2023-11-24 DIAGNOSIS — I129 Hypertensive chronic kidney disease with stage 1 through stage 4 chronic kidney disease, or unspecified chronic kidney disease: Secondary | ICD-10-CM | POA: Diagnosis not present

## 2023-11-24 DIAGNOSIS — N182 Chronic kidney disease, stage 2 (mild): Secondary | ICD-10-CM | POA: Diagnosis not present

## 2023-11-26 DIAGNOSIS — M79674 Pain in right toe(s): Secondary | ICD-10-CM | POA: Diagnosis not present

## 2023-11-26 DIAGNOSIS — M79675 Pain in left toe(s): Secondary | ICD-10-CM | POA: Diagnosis not present

## 2023-11-26 DIAGNOSIS — L605 Yellow nail syndrome: Secondary | ICD-10-CM | POA: Diagnosis not present

## 2023-11-26 DIAGNOSIS — L84 Corns and callosities: Secondary | ICD-10-CM | POA: Diagnosis not present

## 2023-11-26 DIAGNOSIS — G300 Alzheimer's disease with early onset: Secondary | ICD-10-CM | POA: Diagnosis not present

## 2023-11-26 DIAGNOSIS — R2681 Unsteadiness on feet: Secondary | ICD-10-CM | POA: Diagnosis not present

## 2023-11-26 DIAGNOSIS — F0390 Unspecified dementia without behavioral disturbance: Secondary | ICD-10-CM | POA: Diagnosis not present

## 2023-11-27 DIAGNOSIS — F411 Generalized anxiety disorder: Secondary | ICD-10-CM | POA: Diagnosis not present

## 2023-11-27 DIAGNOSIS — G309 Alzheimer's disease, unspecified: Secondary | ICD-10-CM | POA: Diagnosis not present

## 2023-11-27 DIAGNOSIS — F5105 Insomnia due to other mental disorder: Secondary | ICD-10-CM | POA: Diagnosis not present

## 2023-11-27 DIAGNOSIS — F331 Major depressive disorder, recurrent, moderate: Secondary | ICD-10-CM | POA: Diagnosis not present

## 2023-11-28 DIAGNOSIS — I129 Hypertensive chronic kidney disease with stage 1 through stage 4 chronic kidney disease, or unspecified chronic kidney disease: Secondary | ICD-10-CM | POA: Diagnosis not present

## 2023-11-28 DIAGNOSIS — K5790 Diverticulosis of intestine, part unspecified, without perforation or abscess without bleeding: Secondary | ICD-10-CM | POA: Diagnosis not present

## 2023-11-28 DIAGNOSIS — F02C4 Dementia in other diseases classified elsewhere, severe, with anxiety: Secondary | ICD-10-CM | POA: Diagnosis not present

## 2023-11-28 DIAGNOSIS — I739 Peripheral vascular disease, unspecified: Secondary | ICD-10-CM | POA: Diagnosis not present

## 2023-11-28 DIAGNOSIS — J069 Acute upper respiratory infection, unspecified: Secondary | ICD-10-CM | POA: Diagnosis not present

## 2023-11-28 DIAGNOSIS — N182 Chronic kidney disease, stage 2 (mild): Secondary | ICD-10-CM | POA: Diagnosis not present

## 2023-11-28 DIAGNOSIS — G309 Alzheimer's disease, unspecified: Secondary | ICD-10-CM | POA: Diagnosis not present

## 2023-11-28 DIAGNOSIS — E782 Mixed hyperlipidemia: Secondary | ICD-10-CM | POA: Diagnosis not present

## 2023-11-28 DIAGNOSIS — Z87442 Personal history of urinary calculi: Secondary | ICD-10-CM | POA: Diagnosis not present

## 2023-11-28 DIAGNOSIS — Z9181 History of falling: Secondary | ICD-10-CM | POA: Diagnosis not present

## 2023-12-05 DIAGNOSIS — N182 Chronic kidney disease, stage 2 (mild): Secondary | ICD-10-CM | POA: Diagnosis not present

## 2023-12-05 DIAGNOSIS — I10 Essential (primary) hypertension: Secondary | ICD-10-CM | POA: Diagnosis not present

## 2023-12-08 DIAGNOSIS — N5089 Other specified disorders of the male genital organs: Secondary | ICD-10-CM | POA: Diagnosis not present

## 2023-12-08 DIAGNOSIS — K4090 Unilateral inguinal hernia, without obstruction or gangrene, not specified as recurrent: Secondary | ICD-10-CM | POA: Diagnosis not present

## 2023-12-09 DIAGNOSIS — N433 Hydrocele, unspecified: Secondary | ICD-10-CM | POA: Diagnosis not present

## 2023-12-09 DIAGNOSIS — N5089 Other specified disorders of the male genital organs: Secondary | ICD-10-CM | POA: Diagnosis not present

## 2023-12-12 DIAGNOSIS — I129 Hypertensive chronic kidney disease with stage 1 through stage 4 chronic kidney disease, or unspecified chronic kidney disease: Secondary | ICD-10-CM | POA: Diagnosis not present

## 2023-12-18 DIAGNOSIS — I440 Atrioventricular block, first degree: Secondary | ICD-10-CM | POA: Diagnosis not present

## 2023-12-18 DIAGNOSIS — R2681 Unsteadiness on feet: Secondary | ICD-10-CM | POA: Diagnosis not present

## 2023-12-18 DIAGNOSIS — N182 Chronic kidney disease, stage 2 (mild): Secondary | ICD-10-CM | POA: Diagnosis not present

## 2023-12-18 DIAGNOSIS — Z515 Encounter for palliative care: Secondary | ICD-10-CM | POA: Diagnosis not present

## 2023-12-18 DIAGNOSIS — F0390 Unspecified dementia without behavioral disturbance: Secondary | ICD-10-CM | POA: Diagnosis not present

## 2023-12-18 DIAGNOSIS — F419 Anxiety disorder, unspecified: Secondary | ICD-10-CM | POA: Diagnosis not present

## 2023-12-18 DIAGNOSIS — R9431 Abnormal electrocardiogram [ECG] [EKG]: Secondary | ICD-10-CM | POA: Diagnosis not present

## 2023-12-18 DIAGNOSIS — R001 Bradycardia, unspecified: Secondary | ICD-10-CM | POA: Diagnosis not present

## 2023-12-18 DIAGNOSIS — I498 Other specified cardiac arrhythmias: Secondary | ICD-10-CM | POA: Diagnosis not present

## 2023-12-18 DIAGNOSIS — N390 Urinary tract infection, site not specified: Secondary | ICD-10-CM | POA: Diagnosis not present

## 2023-12-22 DIAGNOSIS — F02C4 Dementia in other diseases classified elsewhere, severe, with anxiety: Secondary | ICD-10-CM | POA: Diagnosis not present

## 2023-12-22 DIAGNOSIS — N5089 Other specified disorders of the male genital organs: Secondary | ICD-10-CM | POA: Diagnosis not present

## 2023-12-22 DIAGNOSIS — G309 Alzheimer's disease, unspecified: Secondary | ICD-10-CM | POA: Diagnosis not present

## 2023-12-22 DIAGNOSIS — R7303 Prediabetes: Secondary | ICD-10-CM | POA: Diagnosis not present

## 2023-12-22 DIAGNOSIS — K4090 Unilateral inguinal hernia, without obstruction or gangrene, not specified as recurrent: Secondary | ICD-10-CM | POA: Diagnosis not present

## 2023-12-22 DIAGNOSIS — R001 Bradycardia, unspecified: Secondary | ICD-10-CM | POA: Diagnosis not present

## 2023-12-23 DIAGNOSIS — K4090 Unilateral inguinal hernia, without obstruction or gangrene, not specified as recurrent: Secondary | ICD-10-CM | POA: Diagnosis not present

## 2023-12-23 DIAGNOSIS — I440 Atrioventricular block, first degree: Secondary | ICD-10-CM | POA: Diagnosis not present

## 2023-12-23 DIAGNOSIS — F411 Generalized anxiety disorder: Secondary | ICD-10-CM | POA: Diagnosis not present

## 2023-12-23 DIAGNOSIS — F0390 Unspecified dementia without behavioral disturbance: Secondary | ICD-10-CM | POA: Diagnosis not present

## 2023-12-23 DIAGNOSIS — N390 Urinary tract infection, site not specified: Secondary | ICD-10-CM | POA: Diagnosis not present

## 2023-12-23 DIAGNOSIS — Z515 Encounter for palliative care: Secondary | ICD-10-CM | POA: Diagnosis not present

## 2023-12-24 DIAGNOSIS — F411 Generalized anxiety disorder: Secondary | ICD-10-CM | POA: Diagnosis not present

## 2023-12-24 DIAGNOSIS — F331 Major depressive disorder, recurrent, moderate: Secondary | ICD-10-CM | POA: Diagnosis not present

## 2023-12-25 DIAGNOSIS — F411 Generalized anxiety disorder: Secondary | ICD-10-CM | POA: Diagnosis not present

## 2023-12-25 DIAGNOSIS — F331 Major depressive disorder, recurrent, moderate: Secondary | ICD-10-CM | POA: Diagnosis not present

## 2023-12-25 DIAGNOSIS — F5105 Insomnia due to other mental disorder: Secondary | ICD-10-CM | POA: Diagnosis not present

## 2023-12-25 DIAGNOSIS — G309 Alzheimer's disease, unspecified: Secondary | ICD-10-CM | POA: Diagnosis not present

## 2023-12-30 DIAGNOSIS — F0390 Unspecified dementia without behavioral disturbance: Secondary | ICD-10-CM | POA: Diagnosis not present

## 2023-12-30 DIAGNOSIS — Z515 Encounter for palliative care: Secondary | ICD-10-CM | POA: Diagnosis not present

## 2023-12-30 DIAGNOSIS — I440 Atrioventricular block, first degree: Secondary | ICD-10-CM | POA: Diagnosis not present

## 2024-01-05 DIAGNOSIS — I10 Essential (primary) hypertension: Secondary | ICD-10-CM | POA: Diagnosis not present

## 2024-01-05 DIAGNOSIS — F411 Generalized anxiety disorder: Secondary | ICD-10-CM | POA: Diagnosis not present

## 2024-01-06 DIAGNOSIS — Z515 Encounter for palliative care: Secondary | ICD-10-CM | POA: Diagnosis not present

## 2024-01-06 DIAGNOSIS — R2681 Unsteadiness on feet: Secondary | ICD-10-CM | POA: Diagnosis not present

## 2024-01-06 DIAGNOSIS — F0390 Unspecified dementia without behavioral disturbance: Secondary | ICD-10-CM | POA: Diagnosis not present

## 2024-01-06 DIAGNOSIS — I440 Atrioventricular block, first degree: Secondary | ICD-10-CM | POA: Diagnosis not present

## 2024-01-07 DIAGNOSIS — F331 Major depressive disorder, recurrent, moderate: Secondary | ICD-10-CM | POA: Diagnosis not present

## 2024-01-07 DIAGNOSIS — F411 Generalized anxiety disorder: Secondary | ICD-10-CM | POA: Diagnosis not present

## 2024-01-19 DIAGNOSIS — Z6825 Body mass index (BMI) 25.0-25.9, adult: Secondary | ICD-10-CM | POA: Diagnosis not present

## 2024-01-19 DIAGNOSIS — G309 Alzheimer's disease, unspecified: Secondary | ICD-10-CM | POA: Diagnosis not present

## 2024-01-19 DIAGNOSIS — K4090 Unilateral inguinal hernia, without obstruction or gangrene, not specified as recurrent: Secondary | ICD-10-CM | POA: Diagnosis not present

## 2024-01-19 DIAGNOSIS — R2681 Unsteadiness on feet: Secondary | ICD-10-CM | POA: Diagnosis not present

## 2024-01-19 DIAGNOSIS — K5900 Constipation, unspecified: Secondary | ICD-10-CM | POA: Diagnosis not present

## 2024-01-19 DIAGNOSIS — F02C4 Dementia in other diseases classified elsewhere, severe, with anxiety: Secondary | ICD-10-CM | POA: Diagnosis not present

## 2024-01-19 DIAGNOSIS — E663 Overweight: Secondary | ICD-10-CM | POA: Diagnosis not present

## 2024-01-21 DIAGNOSIS — G309 Alzheimer's disease, unspecified: Secondary | ICD-10-CM | POA: Diagnosis not present

## 2024-01-21 DIAGNOSIS — I739 Peripheral vascular disease, unspecified: Secondary | ICD-10-CM | POA: Diagnosis not present

## 2024-01-21 DIAGNOSIS — F02C4 Dementia in other diseases classified elsewhere, severe, with anxiety: Secondary | ICD-10-CM | POA: Diagnosis not present

## 2024-01-29 DIAGNOSIS — M79674 Pain in right toe(s): Secondary | ICD-10-CM | POA: Diagnosis not present

## 2024-01-29 DIAGNOSIS — G300 Alzheimer's disease with early onset: Secondary | ICD-10-CM | POA: Diagnosis not present

## 2024-01-29 DIAGNOSIS — F0390 Unspecified dementia without behavioral disturbance: Secondary | ICD-10-CM | POA: Diagnosis not present

## 2024-01-29 DIAGNOSIS — L608 Other nail disorders: Secondary | ICD-10-CM | POA: Diagnosis not present

## 2024-01-29 DIAGNOSIS — R2689 Other abnormalities of gait and mobility: Secondary | ICD-10-CM | POA: Diagnosis not present

## 2024-01-29 DIAGNOSIS — L851 Acquired keratosis [keratoderma] palmaris et plantaris: Secondary | ICD-10-CM | POA: Diagnosis not present

## 2024-01-29 DIAGNOSIS — M79675 Pain in left toe(s): Secondary | ICD-10-CM | POA: Diagnosis not present

## 2024-02-03 DIAGNOSIS — F411 Generalized anxiety disorder: Secondary | ICD-10-CM | POA: Diagnosis not present

## 2024-02-03 DIAGNOSIS — I440 Atrioventricular block, first degree: Secondary | ICD-10-CM | POA: Diagnosis not present

## 2024-02-03 DIAGNOSIS — I10 Essential (primary) hypertension: Secondary | ICD-10-CM | POA: Diagnosis not present

## 2024-02-03 DIAGNOSIS — Z515 Encounter for palliative care: Secondary | ICD-10-CM | POA: Diagnosis not present

## 2024-02-03 DIAGNOSIS — F0390 Unspecified dementia without behavioral disturbance: Secondary | ICD-10-CM | POA: Diagnosis not present

## 2024-02-03 DIAGNOSIS — R2681 Unsteadiness on feet: Secondary | ICD-10-CM | POA: Diagnosis not present

## 2024-02-11 ENCOUNTER — Other Ambulatory Visit: Payer: Self-pay

## 2024-02-11 ENCOUNTER — Emergency Department

## 2024-02-11 ENCOUNTER — Inpatient Hospital Stay
Admission: EM | Admit: 2024-02-11 | Discharge: 2024-04-02 | DRG: 308 | Disposition: A | Discharge status: Skilled Nursing Facility | Source: Skilled Nursing Facility | Attending: Student in an Organized Health Care Education/Training Program | Admitting: Student in an Organized Health Care Education/Training Program

## 2024-02-11 ENCOUNTER — Inpatient Hospital Stay

## 2024-02-11 DIAGNOSIS — M6281 Muscle weakness (generalized): Secondary | ICD-10-CM | POA: Diagnosis not present

## 2024-02-11 DIAGNOSIS — W07XXXA Fall from chair, initial encounter: Secondary | ICD-10-CM

## 2024-02-11 DIAGNOSIS — Z515 Encounter for palliative care: Secondary | ICD-10-CM | POA: Diagnosis not present

## 2024-02-11 DIAGNOSIS — I495 Sick sinus syndrome: Secondary | ICD-10-CM | POA: Diagnosis not present

## 2024-02-11 DIAGNOSIS — K4030 Unilateral inguinal hernia, with obstruction, without gangrene, not specified as recurrent: Secondary | ICD-10-CM | POA: Diagnosis not present

## 2024-02-11 DIAGNOSIS — Z79899 Other long term (current) drug therapy: Secondary | ICD-10-CM | POA: Diagnosis not present

## 2024-02-11 DIAGNOSIS — F02C18 Dementia in other diseases classified elsewhere, severe, with other behavioral disturbance: Secondary | ICD-10-CM | POA: Diagnosis present

## 2024-02-11 DIAGNOSIS — F05 Delirium due to known physiological condition: Secondary | ICD-10-CM | POA: Diagnosis present

## 2024-02-11 DIAGNOSIS — S0990XA Unspecified injury of head, initial encounter: Secondary | ICD-10-CM | POA: Diagnosis not present

## 2024-02-11 DIAGNOSIS — R2681 Unsteadiness on feet: Secondary | ICD-10-CM | POA: Diagnosis not present

## 2024-02-11 DIAGNOSIS — R278 Other lack of coordination: Secondary | ICD-10-CM | POA: Diagnosis not present

## 2024-02-11 DIAGNOSIS — N281 Cyst of kidney, acquired: Secondary | ICD-10-CM | POA: Diagnosis not present

## 2024-02-11 DIAGNOSIS — Z23 Encounter for immunization: Secondary | ICD-10-CM | POA: Diagnosis not present

## 2024-02-11 DIAGNOSIS — J189 Pneumonia, unspecified organism: Secondary | ICD-10-CM | POA: Diagnosis not present

## 2024-02-11 DIAGNOSIS — Z6825 Body mass index (BMI) 25.0-25.9, adult: Secondary | ICD-10-CM

## 2024-02-11 DIAGNOSIS — F0280 Dementia in other diseases classified elsewhere without behavioral disturbance: Principal | ICD-10-CM

## 2024-02-11 DIAGNOSIS — R918 Other nonspecific abnormal finding of lung field: Secondary | ICD-10-CM | POA: Diagnosis not present

## 2024-02-11 DIAGNOSIS — Z9049 Acquired absence of other specified parts of digestive tract: Secondary | ICD-10-CM

## 2024-02-11 DIAGNOSIS — Z789 Other specified health status: Secondary | ICD-10-CM | POA: Diagnosis not present

## 2024-02-11 DIAGNOSIS — E785 Hyperlipidemia, unspecified: Secondary | ICD-10-CM | POA: Diagnosis not present

## 2024-02-11 DIAGNOSIS — E663 Overweight: Secondary | ICD-10-CM | POA: Diagnosis not present

## 2024-02-11 DIAGNOSIS — Z604 Social exclusion and rejection: Secondary | ICD-10-CM | POA: Diagnosis present

## 2024-02-11 DIAGNOSIS — G319 Degenerative disease of nervous system, unspecified: Secondary | ICD-10-CM | POA: Diagnosis not present

## 2024-02-11 DIAGNOSIS — E876 Hypokalemia: Secondary | ICD-10-CM | POA: Diagnosis not present

## 2024-02-11 DIAGNOSIS — I959 Hypotension, unspecified: Secondary | ICD-10-CM | POA: Diagnosis not present

## 2024-02-11 DIAGNOSIS — F411 Generalized anxiety disorder: Secondary | ICD-10-CM | POA: Diagnosis not present

## 2024-02-11 DIAGNOSIS — G309 Alzheimer's disease, unspecified: Secondary | ICD-10-CM | POA: Diagnosis not present

## 2024-02-11 DIAGNOSIS — R059 Cough, unspecified: Secondary | ICD-10-CM | POA: Diagnosis not present

## 2024-02-11 DIAGNOSIS — I10 Essential (primary) hypertension: Secondary | ICD-10-CM | POA: Diagnosis not present

## 2024-02-11 DIAGNOSIS — R531 Weakness: Secondary | ICD-10-CM | POA: Diagnosis not present

## 2024-02-11 DIAGNOSIS — Z66 Do not resuscitate: Secondary | ICD-10-CM | POA: Diagnosis not present

## 2024-02-11 DIAGNOSIS — M6259 Muscle wasting and atrophy, not elsewhere classified, multiple sites: Secondary | ICD-10-CM | POA: Diagnosis not present

## 2024-02-11 DIAGNOSIS — R55 Syncope and collapse: Secondary | ICD-10-CM | POA: Diagnosis present

## 2024-02-11 DIAGNOSIS — I493 Ventricular premature depolarization: Secondary | ICD-10-CM | POA: Diagnosis not present

## 2024-02-11 DIAGNOSIS — S199XXA Unspecified injury of neck, initial encounter: Secondary | ICD-10-CM | POA: Diagnosis not present

## 2024-02-11 DIAGNOSIS — K4090 Unilateral inguinal hernia, without obstruction or gangrene, not specified as recurrent: Secondary | ICD-10-CM | POA: Diagnosis not present

## 2024-02-11 DIAGNOSIS — R0989 Other specified symptoms and signs involving the circulatory and respiratory systems: Secondary | ICD-10-CM | POA: Diagnosis not present

## 2024-02-11 DIAGNOSIS — Z781 Physical restraint status: Secondary | ICD-10-CM | POA: Diagnosis not present

## 2024-02-11 DIAGNOSIS — R1311 Dysphagia, oral phase: Secondary | ICD-10-CM | POA: Diagnosis not present

## 2024-02-11 DIAGNOSIS — Z736 Limitation of activities due to disability: Secondary | ICD-10-CM | POA: Diagnosis not present

## 2024-02-11 DIAGNOSIS — Z7401 Bed confinement status: Secondary | ICD-10-CM | POA: Diagnosis not present

## 2024-02-11 DIAGNOSIS — K5730 Diverticulosis of large intestine without perforation or abscess without bleeding: Secondary | ICD-10-CM | POA: Diagnosis not present

## 2024-02-11 DIAGNOSIS — N200 Calculus of kidney: Secondary | ICD-10-CM | POA: Diagnosis not present

## 2024-02-11 DIAGNOSIS — R600 Localized edema: Secondary | ICD-10-CM | POA: Diagnosis not present

## 2024-02-11 DIAGNOSIS — Z7189 Other specified counseling: Secondary | ICD-10-CM | POA: Diagnosis not present

## 2024-02-11 DIAGNOSIS — G4700 Insomnia, unspecified: Secondary | ICD-10-CM | POA: Diagnosis not present

## 2024-02-11 DIAGNOSIS — R001 Bradycardia, unspecified: Secondary | ICD-10-CM | POA: Diagnosis present

## 2024-02-11 DIAGNOSIS — G9341 Metabolic encephalopathy: Secondary | ICD-10-CM | POA: Diagnosis present

## 2024-02-11 DIAGNOSIS — M79632 Pain in left forearm: Secondary | ICD-10-CM | POA: Diagnosis not present

## 2024-02-11 DIAGNOSIS — F01C0 Vascular dementia, severe, without behavioral disturbance, psychotic disturbance, mood disturbance, and anxiety: Secondary | ICD-10-CM | POA: Diagnosis not present

## 2024-02-11 DIAGNOSIS — Z885 Allergy status to narcotic agent status: Secondary | ICD-10-CM | POA: Diagnosis not present

## 2024-02-11 DIAGNOSIS — M7989 Other specified soft tissue disorders: Secondary | ICD-10-CM | POA: Diagnosis not present

## 2024-02-11 LAB — URINALYSIS, W/ REFLEX TO CULTURE (INFECTION SUSPECTED)
Bilirubin Urine: NEGATIVE
Glucose, UA: NEGATIVE mg/dL
Ketones, ur: NEGATIVE mg/dL
Leukocytes,Ua: NEGATIVE
Nitrite: NEGATIVE
Protein, ur: NEGATIVE mg/dL
Specific Gravity, Urine: 1.008 (ref 1.005–1.030)
pH: 6 (ref 5.0–8.0)

## 2024-02-11 LAB — CBC WITH DIFFERENTIAL/PLATELET
Abs Immature Granulocytes: 0.01 10*3/uL (ref 0.00–0.07)
Basophils Absolute: 0 10*3/uL (ref 0.0–0.1)
Basophils Relative: 1 %
Eosinophils Absolute: 0 10*3/uL (ref 0.0–0.5)
Eosinophils Relative: 1 %
HCT: 45.3 % (ref 39.0–52.0)
Hemoglobin: 15.3 g/dL (ref 13.0–17.0)
Immature Granulocytes: 0 %
Lymphocytes Relative: 12 %
Lymphs Abs: 0.8 10*3/uL (ref 0.7–4.0)
MCH: 30.9 pg (ref 26.0–34.0)
MCHC: 33.8 g/dL (ref 30.0–36.0)
MCV: 91.5 fL (ref 80.0–100.0)
Monocytes Absolute: 0.7 10*3/uL (ref 0.1–1.0)
Monocytes Relative: 10 %
Neutro Abs: 5 10*3/uL (ref 1.7–7.7)
Neutrophils Relative %: 76 %
Platelets: 122 10*3/uL — ABNORMAL LOW (ref 150–400)
RBC: 4.95 MIL/uL (ref 4.22–5.81)
RDW: 13.1 % (ref 11.5–15.5)
WBC: 6.5 10*3/uL (ref 4.0–10.5)
nRBC: 0 % (ref 0.0–0.2)

## 2024-02-11 LAB — COMPREHENSIVE METABOLIC PANEL WITH GFR
ALT: 14 U/L (ref 0–44)
AST: 22 U/L (ref 15–41)
Albumin: 3.8 g/dL (ref 3.5–5.0)
Alkaline Phosphatase: 44 U/L (ref 38–126)
Anion gap: 8 (ref 5–15)
BUN: 16 mg/dL (ref 8–23)
CO2: 23 mmol/L (ref 22–32)
Calcium: 8.6 mg/dL — ABNORMAL LOW (ref 8.9–10.3)
Chloride: 105 mmol/L (ref 98–111)
Creatinine, Ser: 1.15 mg/dL (ref 0.61–1.24)
GFR, Estimated: >60 mL/min (ref >60)
Glucose, Bld: 165 mg/dL — ABNORMAL HIGH (ref 70–99)
Potassium: 3.3 mmol/L — ABNORMAL LOW (ref 3.5–5.1)
Sodium: 136 mmol/L (ref 135–145)
Total Bilirubin: 0.6 mg/dL (ref 0.0–1.2)
Total Protein: 7 g/dL (ref 6.5–8.1)

## 2024-02-11 LAB — TROPONIN I (HIGH SENSITIVITY): Troponin I (High Sensitivity): 6 ng/L (ref <18)

## 2024-02-11 LAB — MAGNESIUM: Magnesium: 1.7 mg/dL (ref 1.7–2.4)

## 2024-02-11 MED ORDER — SODIUM CHLORIDE 0.9 % IV BOLUS
500.0000 mL | Freq: Once | INTRAVENOUS | Status: AC
  Administered 2024-02-11: 500 mL via INTRAVENOUS

## 2024-02-11 MED ORDER — POLYETHYLENE GLYCOL 3350 17 G PO PACK: 17.0000 g | PACK | Freq: Every day | ORAL | Status: DC | PRN

## 2024-02-11 MED ORDER — SERTRALINE HCL 50 MG PO TABS
50.0000 mg | ORAL_TABLET | Freq: Every day | ORAL | Status: DC
  Administered 2024-02-11 – 2024-04-01 (×50): 50 mg via ORAL
  Filled 2024-02-11 (×51): qty 1

## 2024-02-11 MED ORDER — ENOXAPARIN SODIUM 40 MG/0.4ML IJ SOSY
40.0000 mg | PREFILLED_SYRINGE | INTRAMUSCULAR | Status: DC
  Administered 2024-02-11 – 2024-04-01 (×50): 40 mg via SUBCUTANEOUS
  Filled 2024-02-11 (×51): qty 0.4

## 2024-02-11 MED ORDER — POTASSIUM CHLORIDE 20 MEQ PO PACK
40.0000 meq | PACK | ORAL | Status: AC
  Administered 2024-02-11: 40 meq via ORAL
  Filled 2024-02-11: qty 2

## 2024-02-11 MED ORDER — ZIPRASIDONE MESYLATE 20 MG IM SOLR: 20.0000 mg | Freq: Once | INTRAMUSCULAR | Status: DC

## 2024-02-11 MED ORDER — HYDRALAZINE HCL 20 MG/ML IJ SOLN
10.0000 mg | Freq: Four times a day (QID) | INTRAMUSCULAR | Status: DC | PRN
  Administered 2024-02-12: 10 mg via INTRAVENOUS
  Filled 2024-02-11 (×2): qty 1

## 2024-02-11 MED ORDER — SENNA 8.6 MG PO TABS
1.0000 | ORAL_TABLET | ORAL | Status: DC
  Administered 2024-02-16 – 2024-03-31 (×16): 8.6 mg via ORAL
  Filled 2024-02-11 (×21): qty 1

## 2024-02-11 MED ORDER — IOHEXOL 300 MG/ML  SOLN
100.0000 mL | Freq: Once | INTRAMUSCULAR | Status: AC | PRN
  Administered 2024-02-11: 100 mL via INTRAVENOUS

## 2024-02-11 MED ORDER — MELATONIN 5 MG PO TABS
5.0000 mg | ORAL_TABLET | Freq: Every day | ORAL | Status: DC
  Administered 2024-02-11 – 2024-04-01 (×49): 5 mg via ORAL
  Filled 2024-02-11 (×51): qty 1

## 2024-02-11 NOTE — H&P (Signed)
 History and Physical    Patient: Seth Thompson ZOX:[id number] DOB: [birth year] DOA: 02/11/2024 DOS: the patient was seen and examined on 02/11/2024 PCP: [facility]  Patient coming from: SNF  Chief Complaint: syncope Chief Complaint  Patient presents with   Loss of Consciousness   HPI: ADRION Thompson is a 78 y.o. male with medical history significant of essential hypertension, dementia, frequent PVCs, hyperlipidemia who was brought in from a facility after the patient lost consciousness while sitting on the shower chair at the facility.  At the time patient was being seen he was pleasantly confused and so unable to contribute to history.  History was obtained from chart review as well as discussion with the ED physician Upon arrival to the emergency room patient was found to be bradycardic in the 50s and had a drop of his pulse into the 20s.  Hospitalist service was therefore contacted to admit patient for further management.  ED physician also spoke with cardiologist who will be seeing patient in consultation.  Patient also found to have incidental right inguinal hernia that was irreducible and as such general surgeon has been consulted.  Review of Systems: unable to review all systems due to the inability of the patient to answer questions. Past Medical History:  Diagnosis Date   Benign essential HTN 02/03/2018   Dementia (HCC)    Diverticulitis of large intestine with abscess without bleeding 04/03/2018   Frequent PVCs 07/02/2016   Hyperlipidemia    Right inguinal hernia 04/01/2023   Past Surgical History:  Procedure Laterality Date   CHOLECYSTECTOMY     COLONOSCOPY WITH PROPOFOL  N/A 07/24/2023   Procedure: COLONOSCOPY WITH PROPOFOL ;  Surgeon: Luke Salaam, MD;  Location: [facility] ENDOSCOPY;  Service: Gastroenterology;  Laterality: N/A;   COLONOSCOPY WITH PROPOFOL  N/A 08/20/2023   Procedure: COLONOSCOPY WITH PROPOFOL ;  Surgeon: Luke Salaam, MD;  Location: [facility] ENDOSCOPY;   Service: Gastroenterology;  Laterality: N/A;   Social History:  reports that he has never smoked. He has never been exposed to tobacco smoke. He has never used smokeless tobacco. He reports that he does not currently use alcohol. He reports that he does not currently use drugs.  Allergies  Allergen Reactions   Codeine Other (See Comments)    History reviewed. No pertinent family history.  Prior to Admission medications   Medication Sig Start Date End Date Taking? Authorizing Provider  melatonin 5 MG TABS Take 5 mg by mouth.    [provider]  Multiple Vitamin (MULTIVITAMIN) capsule Take 1 capsule by mouth daily.    [provider]  senna (SENOKOT) 8.6 MG TABS tablet Take 1 tablet by mouth.    [provider]  sertraline (ZOLOFT) 50 MG tablet Take 50 mg by mouth at bedtime.    [provider]    Physical Exam: Vitals:   02/11/24 0830 02/11/24 0930 02/11/24 1000 02/11/24 1042  BP: (!) 183/81 (!) 168/81 (!) 160/133   Pulse: (!) 57 68 61   Resp: 16 17    Temp:    97.7 F (36.5 C)  TempSrc:    Axillary  SpO2: 100% 100% 99%   Weight:       General: He is not in acute distress.  Pleasantly confused    Appearance: He is not ill-appearing.  Cardiovascular:     Rate and Rhythm: Normal rate and regular rhythm.     Heart sounds: Normal heart sounds.  Pulmonary:     Effort: Pulmonary effort is normal. No accessory muscle  usage.  Neurological:     General: No focal deficit present.     Mental Status: He is alert.  Psychiatric:    Confused Abdomen: Full nondistended, tenderness noted to the right inguinal area, right-sided inguinal hernia noted  Data Reviewed:   CT scan of the abdomen reviewed showing findings of large right-sided inguinal hernia containing loops of bowel with no signs of obstruction     Latest Ref Rng & Units 02/11/2024    6:38 AM 04/14/2023    6:37 PM 03/08/2023    4:40 PM  CBC  WBC 4.0 - 10.5 K/uL 6.5  10.4  8.0   Hemoglobin  13.0 - 17.0 g/dL 16.1  09.6  04.5   Hematocrit 39.0 - 52.0 % 45.3  50.4  48.2   Platelets 150 - 400 K/uL 122  160  150        Latest Ref Rng & Units 02/11/2024    6:38 AM 04/14/2023    6:37 PM 03/08/2023    4:40 PM  CMP  Glucose 70 - 99 mg/dL 409  811  914   BUN 8 - 23 mg/dL 16  21  17    Creatinine 0.61 - 1.24 mg/dL 7.82  9.56  2.13   Sodium 135 - 145 mmol/L 136  139  138   Potassium 3.5 - 5.1 mmol/L 3.3  3.8  3.4   Chloride 98 - 111 mmol/L 105  106  105   CO2 22 - 32 mmol/L 23  20  25    Calcium 8.9 - 10.3 mg/dL 8.6  9.8  8.8   Total Protein 6.5 - 8.1 g/dL 7.0   7.0   Total Bilirubin 0.0 - 1.2 mg/dL 0.6   0.7   Alkaline Phos 38 - 126 U/L 44   35   AST 15 - 41 U/L 22   31   ALT 0 - 44 U/L 14   26      Assessment and Plan:  Syncope in the setting of bradycardia Upon arrival to the emergency room patient was found to be bradycardic in the 50s and had a drop of his pulse into the 20s Cardiologist consulted Follow-up on echocardiogram Continue telemetry monitoring  Hypokalemia-continue repletion and monitoring  Incarcerated right sided inguinal hernia General Surgeon consulted Continue as needed pain medication  Essential hypertension, Avoid AV nodal blocking agents Other antihypertensives on hold as recommended by cardiology Patient placed on as needed hydralazine for systolic blood pressure greater than 170  Severe dementia with behavioral disturbances Continue delirium precaution One-to-one sitter  Hyperlipidemia-does not appear to be on statin  DVT prophylaxis-continue Lovenox    Advance Care Planning:   Code Status: Prior full code  Consults: Cardiology, general surgery  Family Communication: I tried to call patient's son at the number listed however he was unable to answer and voice message was left  Severity of Illness: The appropriate patient status for this patient is INPATIENT. Inpatient status is judged to be reasonable and necessary in order to provide the  required intensity of service to ensure the patient's safety. The patient's presenting symptoms, physical exam findings, and initial radiographic and laboratory data in the context of their chronic comorbidities is felt to place them at high risk for further clinical deterioration. Furthermore, it is not anticipated that the patient will be medically stable for discharge from the hospital within 2 midnights of admission.   * I certify that at the point of admission it is my clinical judgment that the patient  will require inpatient hospital care spanning beyond 2 midnights from the point of admission due to high intensity of service, high risk for further deterioration and high frequency of surveillance required.*  Author: Ezzard Holms, MD 02/11/2024 11:37 AM  For on call review [email or web address].

## 2024-02-11 NOTE — ED Notes (Signed)
 Pt has a 1:1 sitter with EDT Janeeta.

## 2024-02-11 NOTE — ED Notes (Signed)
 Pt continues to try to get out of the bed. Staff continues to try to verbally redirect and distract pt from getting out of the bed. Pt continues to get harder to redirect. Mitts are slightly helpful in keeping pt in the bed. 1:1 sitter at bedside

## 2024-02-11 NOTE — ED Triage Notes (Signed)
 Pt presented to ED from [facility] for syncopal episode while in the shower. Per EMS, staff states that he was seated in the shower when he had a syncopal episode and slid out of shower chair. Per EMS, staff stated pt was at baseline prior to leaving facility. A&Ox name only. Skin tear noted to right elbow.

## 2024-02-11 NOTE — ED Notes (Signed)
 This tech assisted pt with dinner tray. Pt consumed 100% of meal.

## 2024-02-11 NOTE — ED Notes (Signed)
 This tech and Seth Thompson EDT changed pt after urinary incontinence. Pt's linen, chux pad, and brief were changed. Pt resting in bed at this time with sitter at bedside.

## 2024-02-11 NOTE — ED Notes (Signed)
 Pt attempting to take off monitoring equipment, and climb out of the bed. Pt was verbally redirected and returned to bed. VS monitoring equipment reattached. Pt in the bed and bed is in the lowest, locked position at this time.

## 2024-02-11 NOTE — ED Notes (Signed)
 This tech and Seth Thompson changed pt after urinary incontinence. Pt resting in bed at this time with sitter at bedside.

## 2024-02-11 NOTE — ED Notes (Signed)
 This RN with the assistance of EMS student Sammie Crigler got pt up from the bed, and assisted them to the toilet in the room. Pt had to be redirected to sit on the toilet and once on the toilet redirected to use the toilet. Pt was cleaned after a small bowel movement and pt had a new brief applied and put back in the bed. Pt was then set up with lunch and pt fed themself.

## 2024-02-11 NOTE — ED Provider Notes (Addendum)
 [facility] Provider Note    Event Date/Time   First MD Initiated Contact with Patient 02/11/24 0700     (approximate)   History   Chief Complaint: Loss of Consciousness   HPI  Seth Thompson is a 78 y.o. male with a history of hypertension, dementia who was sent to the ED due to syncope.  Reportedly patient was in his usual state of health, sitting in the shower and passed out and slid off of his shower chair to the floor.  No significant injuries.  Patient is not able to relate any history or complaints due to severe dementia.        Past Medical History:  Diagnosis Date   Benign essential HTN 02/03/2018   Dementia (HCC)    Diverticulitis of large intestine with abscess without bleeding 04/03/2018   Frequent PVCs 07/02/2016   Hyperlipidemia    Right inguinal hernia 04/01/2023    Current Outpatient Rx   Order #: [id number] Class: Historical Med   Order #: [id number] Class: Historical Med   Order #: [id number] Class: Historical Med   Order #: [id number] Class: Historical Med    Past Surgical History:  Procedure Laterality Date   CHOLECYSTECTOMY     COLONOSCOPY WITH PROPOFOL  N/A 07/24/2023   Procedure: COLONOSCOPY WITH PROPOFOL ;  Surgeon: Luke Salaam, MD;  Location: [facility] ENDOSCOPY;  Service: Gastroenterology;  Laterality: N/A;   COLONOSCOPY WITH PROPOFOL  N/A 08/20/2023   Procedure: COLONOSCOPY WITH PROPOFOL ;  Surgeon: Luke Salaam, MD;  Location: [facility] ENDOSCOPY;  Service: Gastroenterology;  Laterality: N/A;    Physical Exam   Triage Vital Signs: ED Triage Vitals  Encounter Vitals Group     BP 02/11/24 0631 131/60     Systolic BP Percentile --      Diastolic BP Percentile --      Pulse Rate 02/11/24 0631 (!) 57     Resp 02/11/24 0631 14     Temp 02/11/24 0631 97.6 F (36.4 C)     Temp Source 02/11/24 0631 Oral     SpO2 02/11/24 0631 93 %     Weight 02/11/24 0633 198 lb 3.2 oz (89.9 kg)     Height --      Head Circumference --       Peak Flow --      Pain Score 02/11/24 0632 5     Pain Loc --      Pain Education --      Exclude from Growth Chart --     Most recent vital signs: Vitals:   02/11/24 1000 02/11/24 1042  BP: (!) 160/133   Pulse: 61   Resp:    Temp:  97.7 F (36.5 C)  SpO2: 99%     General: Awake, no distress.  Speech is sparse, single word repetitive, disorganized CV:  Good peripheral perfusion.  Bradycardia heart rate 50 Resp:  Normal effort.  Clear to auscultation bilaterally Abd:  No distention.  Soft nontender Other:  Head atraumatic.  No midline spinal tenderness.  Full range of motion extremities.  There is asymmetric edema of the right lower extremity, nontender, no inflammatory skin changes.   ED Results / Procedures / Treatments   Labs (all labs ordered are listed, but only abnormal results are displayed) Labs Reviewed  COMPREHENSIVE METABOLIC PANEL WITH GFR - Abnormal; Notable for the following components:      Result Value   Potassium 3.3 (*)    Glucose, Bld 165 (*)    Calcium 8.6 (*)  All other components within normal limits  CBC WITH DIFFERENTIAL/PLATELET - Abnormal; Notable for the following components:   Platelets 122 (*)    All other components within normal limits  MAGNESIUM  URINALYSIS, W/ REFLEX TO CULTURE (INFECTION SUSPECTED)  TROPONIN I (HIGH SENSITIVITY)  TROPONIN I (HIGH SENSITIVITY)     EKG Interpreted by me Sinus bradycardia rate of 58.  Normal axis, normal intervals.  Normal QRS ST segments and T waves   RADIOLOGY CT head interpreted by me, negative for intracranial hemorrhage.  Radiology report reviewed CT cervical spine unremarkable Ultrasound right lower extremity negative for DVT   PROCEDURES:  .Critical Care  Performed by: Jacquie Maudlin, MD Authorized by: Jacquie Maudlin, MD   Critical care provider statement:    Critical care time (minutes):  35   Critical care time was exclusive of:  Separately billable procedures and treating  other patients   Critical care was necessary to treat or prevent imminent or life-threatening deterioration of the following conditions:  Cardiac failure   Critical care was time spent personally by me on the following activities:  Development of treatment plan with patient or surrogate, discussions with consultants, evaluation of patient's response to treatment, examination of patient, obtaining history from patient or surrogate, ordering and performing treatments and interventions, ordering and review of laboratory studies, ordering and review of radiographic studies, pulse oximetry, re-evaluation of patient's condition and review of old charts   Care discussed with: admitting provider      MEDICATIONS ORDERED IN ED: Medications  potassium chloride  (KLOR-CON ) packet 40 mEq (has no administration in time range)  sodium chloride  0.9 % bolus 500 mL (0 mLs Intravenous Stopped 02/11/24 1056)     IMPRESSION / MDM / ASSESSMENT AND PLAN / ED COURSE  I reviewed the triage vital signs and the nursing notes.  DDx: Intracranial hemorrhage, C-spine fracture, right leg DVT, electrolyte derangement, anemia, heat vasodilation, mechanical fall  Patient's presentation is most consistent with acute presentation with potential threat to life or bodily function.  Patient with advanced dementia sent to the ED due to a fall from his shower chair, no signs of serious trauma on exam.  Trauma workup is unremarkable.  Labs are reassuring.    Clinical Course as of 02/11/24 1125  Wed Feb 11, 2024  1114 Pt seen to have HR decreasing to the 20's on tele monitor - profound bradycardia may be cause of syncope. Will place pacer pads as precaution, supplement K of 3.3 orally, plan to admit.  [PS]    Clinical Course User Index [PS] Jacquie Maudlin, MD     FINAL CLINICAL IMPRESSION(S) / ED DIAGNOSES   Final diagnoses:  Alzheimer's disease (HCC)  Syncope, unspecified syncope type  Bradycardia     Rx / DC Orders    ED Discharge Orders     None        Note:  This document was prepared using Dragon voice recognition software and may include unintentional dictation errors.   Jacquie Maudlin, MD 02/11/24 1610    Jacquie Maudlin, MD 02/11/24 1125

## 2024-02-11 NOTE — ED Notes (Signed)
 This tech and Risk analyst changed pt after urinary incontinence. Pt currently resting with sitter at bedside.

## 2024-02-11 NOTE — ED Notes (Signed)
 Attempted to let guardian know that pt had to be placed in soft restraints, but phone went to vm and the mailbox is full.

## 2024-02-11 NOTE — ED Notes (Signed)
 Pt to CT

## 2024-02-11 NOTE — ED Notes (Signed)
 Pt keeps trying get out of the bed. Staff continues to try to verbally redirect and distract pt from getting out of the bed. Pt is getting harder to redirect. Mitts were placed to try to help keep pt in bed.

## 2024-02-11 NOTE — Consult Note (Signed)
 [facility] SURGICAL CONSULTATION NOTE (initial) - cpt: 99254   HISTORY OF PRESENT ILLNESS (HPI):  78 y.o. Thompson presented to [facility] ED today for evaluation of a fall. Reports that patient was brought in from a facility after he lost consciousness while sitting on the shower chair at the facility.  Coincidental large RIH noted and irreducible.  Surgery is consulted by hospitalist physician Dr. Mariella Shore in this context for evaluation and management of large RIH with SB and incarceration. Info obtained from chart, as pt remains quite confused.    PAST MEDICAL HISTORY (PMH):  Past Medical History:  Diagnosis Date   Benign essential HTN 02/03/2018   Dementia (HCC)    Diverticulitis of large intestine with abscess without bleeding 04/03/2018   Frequent PVCs 09/Seth/2017   Hyperlipidemia    Right inguinal hernia 04/01/2023     PAST SURGICAL HISTORY (PSH):  Past Surgical History:  Procedure Laterality Date   CHOLECYSTECTOMY     COLONOSCOPY WITH PROPOFOL  N/A 07/24/2023   Procedure: COLONOSCOPY WITH PROPOFOL ;  Surgeon: Luke Salaam, MD;  Location: [facility] ENDOSCOPY;  Service: Gastroenterology;  Laterality: N/A;   COLONOSCOPY WITH PROPOFOL  N/A 08/20/2023   Procedure: COLONOSCOPY WITH PROPOFOL ;  Surgeon: Luke Salaam, MD;  Location: [facility] ENDOSCOPY;  Service: Gastroenterology;  Laterality: N/A;     MEDICATIONS:  Prior to Admission medications   Medication Sig Start Date End Date Taking? Authorizing Provider  cyanocobalamin 1000 MCG tablet Take 1,000 mcg by mouth daily.   Yes [provider]  guaifenesin (ROBITUSSIN) 100 MG/5ML syrup Take 10 mLs by mouth every 4 (four) hours as needed for cough or congestion.   Yes [provider]  melatonin 5 MG TABS Take 5 mg by mouth at bedtime.   Yes [provider]  Multiple Vitamin (MULTIVITAMIN) capsule Take 1 capsule by mouth daily.   Yes [provider]  polyethylene glycol (MIRALAX / GLYCOLAX) 17 g packet  Take 17 g by mouth daily as needed for mild constipation.   Yes [provider]  senna (SENOKOT) 8.6 MG TABS tablet Take 1 tablet by mouth 3 (three) times a week. Mondays, Wednesday, and Friday   Yes [provider]  sertraline (ZOLOFT) 50 MG tablet Take 50 mg by mouth at bedtime.   Yes [provider]  simethicone (MYLICON) 125 MG chewable tablet Chew 125 mg by mouth every 6 (six) hours as needed (gas/burping).   Yes [provider]     ALLERGIES:  Allergies  Allergen Reactions   Codeine Other (See Comments)     SOCIAL HISTORY:  Social History   Socioeconomic History   Marital status: Divorced    Spouse name: Not on file   Number of children: Not on file   Years of education: Not on file   Highest education level: Not on file  Occupational History   Not on file  Tobacco Use   Smoking status: Never    Passive exposure: Never   Smokeless tobacco: Never  Vaping Use   Vaping status: Never Used  Substance and Sexual Activity   Alcohol use: Not Currently   Drug use: Not Currently   Sexual activity: Not on file  Other Topics Concern   Not on file  Social History Narrative   Not on file   Social Drivers of Health   Financial Resource Strain: Not on file  Food Insecurity: Not on file  Transportation Needs: Not on file  Physical Activity: Not on file  Stress: Not on file  Social Connections: Not on file  Intimate Partner Violence: Not on file     FAMILY HISTORY:  History reviewed. No pertinent family history.    REVIEW OF SYSTEMS:  Review of Systems  Unable to perform ROS: Dementia    VITAL SIGNS:  Temp:  [97.6 F (36.4 C)-98.4 F (36.9 C)] 98.4 F (36.9 C) (05/07 1544) Pulse Rate:  [45-79] 63 (05/07 1544) Resp:  [12-29] 29 (05/07 1544) BP: (125-189)/(60-148) 189/67 (05/07 1544) SpO2:  [92 %-100 %] 100 % (05/07 1544) Weight:  [89.9 kg] 89.9 kg (05/07 0633)       Weight: 89.9 kg     INTAKE/OUTPUT:  No intake/output data  recorded.  PHYSICAL EXAM:  Physical Exam Blood pressure (!) 189/67, pulse 63, temperature 98.4 F (36.9 C), temperature source Oral, resp. rate (!) 29, weight 89.9 kg, SpO2 100%. Last Weight  Most recent update: 02/11/2024  6:33 AM    Weight  89.9 kg (198 lb 3.2 oz)             CONSTITUTIONAL: Well developed, and nourished, aware without distress.   EYES: Sclera non-icteric.   EARS, NOSE, MOUTH AND THROAT: The oropharynx is clear. Oral mucosa is pink and moist.     Hearing is intact to voice.  NECK: Trachea is midline, and there is no jugular venous distension.  LYMPH NODES:  Lymph nodes in the neck are not enlarged. RESPIRATORY:   Normal respiratory effort without pathologic use of accessory muscles. CARDIOVASCULAR:  Well perfused.  GI: The abdomen is  soft, nontender, and nondistended. There were no palpable masses. I did not appreciate hepatosplenomegaly. There were normal bowel sounds. GU: Large right scrotal hernia, some tenderness to the most dependent scrotum which is slightly pink in color, with a bit of a red hue.  The upper portion of the hernia is soft there are palpable/audible bowel sounds present.  No evidence of strangulation or bowel obstruction, based on clinical presentation and imaging. MUSCULOSKELETAL:  Warm without evidence of acute injury.  SKIN: Skin turgor is normal. No pathologic skin lesions appreciated.    Data Reviewed I have personally reviewed what is currently available of the patient's imaging, recent labs and medical records.    Labs:     Latest Ref Rng & Units 02/11/2024    6:38 AM 04/14/2023    6:37 PM 03/08/2023    4:40 PM  CBC  WBC 4.0 - 10.5 K/uL 6.5  10.4  8.0   Hemoglobin 13.0 - 17.0 g/dL 25.9  56.3  87.5   Hematocrit 39.0 - 52.0 % 45.3  50.4  48.2   Platelets 150 - 400 K/uL 122  160  150       Latest Ref Rng & Units 02/11/2024    6:38 AM 04/14/2023    6:37 PM 03/08/2023    4:40 PM  CMP  Glucose 70 - 99 mg/dL 643  329  518   BUN 8 - 23 mg/dL  16  21  17    Creatinine 0.61 - 1.24 mg/dL 8.41  6.60  6.30   Sodium 135 - 145 mmol/L 136  139  138   Potassium 3.5 - 5.1 mmol/L 3.3  3.8  3.4   Chloride 98 - 111 mmol/L 105  106  105   CO2 22 - 32 mmol/L 23  20  25    Calcium 8.9 - 10.3 mg/dL 8.6  9.8  8.8   Total Protein 6.5 - 8.1 g/dL 7.0   7.0   Total  Bilirubin 0.0 - 1.2 mg/dL 0.6   0.7   Alkaline Phos 38 - 126 U/L 44   35   AST 15 - 41 U/L 22   31   ALT 0 - 44 U/L 14   Seth      Imaging studies:   Last 24 hrs: CT ABDOMEN PELVIS W CONTRAST Result Date: 02/11/2024 CLINICAL DATA:  Right inguinal hernia. Concern for bowel obstruction. EXAM: CT ABDOMEN AND PELVIS WITH CONTRAST TECHNIQUE: Multidetector CT imaging of the abdomen and pelvis was performed using the standard protocol following bolus administration of intravenous contrast. RADIATION DOSE REDUCTION: This exam was performed according to the departmental dose-optimization program which includes automated exposure control, adjustment of the mA and/or kV according to patient size and/or use of iterative reconstruction technique. CONTRAST:  OMNIPAQUE  IOHEXOL  300 MG/ML  SOLN COMPARISON:  CT abdomen pelvis dated 03/08/2023. FINDINGS: Lower chest: Bibasilar atelectasis/scarring. The visualized lung bases are otherwise clear. Coronary vascular calcification. No intra-abdominal free air or free fluid. Hepatobiliary: The liver is unremarkable. There is mild dilatation, post cholecystectomy. No retained calcified stone noted in the central CBD. Pancreas: Unremarkable. No pancreatic ductal dilatation or surrounding inflammatory changes. Spleen: Normal in size without focal abnormality. Adrenals/Urinary Tract: The adrenal glands unremarkable. There is a 2.5 cm right renal inferior pole cyst. There is a 4 mm nonobstructing left renal interpolar stone. There is no hydronephrosis on either side. There is symmetric enhancement and excretion of contrast by both kidneys. The visualized ureters and urinary  bladder appear unremarkable. Stomach/Bowel: There is a large right inguinal hernia containing multiple loops of distal small bowel. There is no bowel obstruction. There is sigmoid diverticulosis. The appendix is normal. Vascular/Lymphatic: Mild aortoiliac atherosclerotic disease. The IVC is unremarkable. No portal venous gas. There is no adenopathy. Reproductive: The prostate and seminal vesicles are grossly unremarkable. No pelvic mass. Other: None Musculoskeletal: Osteopenia with degenerative changes of the spine. No acute osseous pathology. IMPRESSION: 1. Large right inguinal hernia containing multiple loops of distal small bowel. No bowel obstruction. 2. Sigmoid diverticulosis. Normal appendix. 3. A 4 mm nonobstructing left renal interpolar stone. No hydronephrosis. 4.  Aortic Atherosclerosis (ICD10-I70.0). Electronically Signed   By: Angus Bark M.D.   On: 02/11/2024 13:56   US  Venous Img Lower Unilateral Right Result Date: 02/11/2024 CLINICAL DATA:  Right lower extremity edema EXAM: RIGHT LOWER EXTREMITY VENOUS DOPPLER ULTRASOUND TECHNIQUE: Gray-scale sonography with compression, as well as color and duplex ultrasound, were performed to evaluate the deep venous system(s) from the level of the common femoral vein through the popliteal and proximal calf veins. COMPARISON:  None Available. FINDINGS: VENOUS Normal compressibility of the common femoral, superficial femoral, and popliteal veins, as well as the visualized calf veins. Visualized portions of profunda femoral vein and great saphenous vein unremarkable. No filling defects to suggest DVT on grayscale or color Doppler imaging. Doppler waveforms show normal direction of venous flow, normal respiratory plasticity and response to augmentation. Limited views of the contralateral common femoral vein are unremarkable. OTHER Edema is present within the superficial soft tissues. Limitations: none IMPRESSION: 1. No evidence of right lower extremity DVT.  Electronically Signed   By: Reagan Camera M.D.   On: 02/11/2024 08:43   CT Head Wo Contrast Result Date: 02/11/2024 CLINICAL DATA:  Minor head trauma EXAM: CT HEAD WITHOUT CONTRAST CT CERVICAL SPINE WITHOUT CONTRAST TECHNIQUE: Multidetector CT imaging of the head and cervical spine was performed following the standard protocol without intravenous contrast. Multiplanar CT image reconstructions  of the cervical spine were also generated. RADIATION DOSE REDUCTION: This exam was performed according to the departmental dose-optimization program which includes automated exposure control, adjustment of the mA and/or kV according to patient size and/or use of iterative reconstruction technique. COMPARISON:  04/14/2023 FINDINGS: CT HEAD FINDINGS Brain: No evidence of acute infarction, hemorrhage, hydrocephalus, extra-axial collection or mass lesion/mass effect. Advanced and generalized cortical atrophy. Vascular: No hyperdense vessel or unexpected calcification. Skull: Normal. Negative for fracture or focal lesion. Sinuses/Orbits: No evidence of injury CT CERVICAL SPINE FINDINGS Alignment: Normal. Skull base and vertebrae: No acute fracture. No primary bone lesion or focal pathologic process. Soft tissues and spinal canal: No prevertebral fluid or swelling. No visible canal hematoma. Disc levels: Generalized degenerative disc space narrowing and endplate spurring. Upper chest: No evidence of injury IMPRESSION: No evidence of acute intracranial or cervical spine injury. Advanced cortical atrophy. Electronically Signed   By: Ronnette Coke M.D.   On: 02/11/2024 07:19   CT Cervical Spine Wo Contrast Result Date: 02/11/2024 CLINICAL DATA:  Minor head trauma EXAM: CT HEAD WITHOUT CONTRAST CT CERVICAL SPINE WITHOUT CONTRAST TECHNIQUE: Multidetector CT imaging of the head and cervical spine was performed following the standard protocol without intravenous contrast. Multiplanar CT image reconstructions of the cervical spine were  also generated. RADIATION DOSE REDUCTION: This exam was performed according to the departmental dose-optimization program which includes automated exposure control, adjustment of the mA and/or kV according to patient size and/or use of iterative reconstruction technique. COMPARISON:  04/14/2023 FINDINGS: CT HEAD FINDINGS Brain: No evidence of acute infarction, hemorrhage, hydrocephalus, extra-axial collection or mass lesion/mass effect. Advanced and generalized cortical atrophy. Vascular: No hyperdense vessel or unexpected calcification. Skull: Normal. Negative for fracture or focal lesion. Sinuses/Orbits: No evidence of injury CT CERVICAL SPINE FINDINGS Alignment: Normal. Skull base and vertebrae: No acute fracture. No primary bone lesion or focal pathologic process. Soft tissues and spinal canal: No prevertebral fluid or swelling. No visible canal hematoma. Disc levels: Generalized degenerative disc space narrowing and endplate spurring. Upper chest: No evidence of injury IMPRESSION: No evidence of acute intracranial or cervical spine injury. Advanced cortical atrophy. Electronically Signed   By: Ronnette Coke M.D.   On: 02/11/2024 07:19     Assessment/Plan:  78 y.o. Thompson with large right inguinal hernia containing multiple loops of distal small bowel. No bowel obstruction, complicated by pertinent comorbidities including:  Patient Active Problem List   Diagnosis Date Noted   Bradycardia with less than 30 beats per minute 02/11/2024   Right inguinal hernia 04/01/2023   Diarrhea 02/02/2019   Benign essential HTN 02/03/2018   Mild dementia (HCC) 11/25/2017   Mild cognitive impairment 05/27/2017   Loss of memory 02/04/2017   Frequent PVCs 09/Seth/2017   Palpitations 06/27/2016   Bradycardia 02/22/2016   Mixed hyperlipidemia 05/10/2014    - No urgent/emergent surgery planned at this time.  - May resume diet when comfortable,  - Will follow with you.  - DVT prophylaxis  All of the above  findings and recommendations were shared with the medical team. Face-to-face time spent with the patient and accompanying care providers(if present) was 60 minutes, spent counseling, educating, and coordinating care of the patient.   Thank you for the opportunity to participate in this patient's care.   -- Flynn Hylan, M.D., FACS 02/11/2024, 3:56 PM

## 2024-02-11 NOTE — Consult Note (Addendum)
 [facility] CLINIC CARDIOLOGY CONSULT NOTE       Patient ID: Seth Thompson MRN: [id number] DOB/AGE: October 30, 1945 78 y.o.  Admit date: 02/11/2024 Referring Physician Dr. Alvenia Aus Primary Physician Donold Galla, [facility] Primary Cardiologist Kolwalksi (last seen 2022) Reason for Consultation bradycardia, syncope  HPI: Seth Thompson is a 78 y.o. male  with a past medical history of hypertension, sinus bradycardia, severe alzheimer dementia who presented to the ED on 02/11/2024 for syncope. Cardiology was consulted for further evaluation.   Patient is poor historian due to severe dementia, history obtained from chart review. Patient presented to the ED from [place] [facility] after having a witnessed syncopal event that occurred while seated in the shower. Work up in the ED notable for Na 136, K 3.3, Mg 1.7, Cr 1.15, Hgb 15.3. CT cervical spine and CT head with no acute abnormalities. LE US  with no evidence of DVT.  Trop negative x1. EKG in ED with sinus bradycardia with short sinus pause, rate 58 bpm. Patient received IVF in ED.   At the time of my evaluation this afternoon, patient was resting comfortably in ED stretcher with no one at bedside. Patient has severe dementia and unable to answer any questions. Patient has history of bradycardia. Per tele patient in sinus bradycardia, rate 50s with longest sinus pause 2.1 seconds, no sign of high grade AVB. This afternoon patient is hypertensive with stable HR.  Review of systems complete and found to be negative unless listed above    Past Medical History:  Diagnosis Date   Benign essential HTN 02/03/2018   Dementia (HCC)    Diverticulitis of large intestine with abscess without bleeding 04/03/2018   Frequent PVCs 07/02/2016   Hyperlipidemia    Right inguinal hernia 04/01/2023    Past Surgical History:  Procedure Laterality Date   CHOLECYSTECTOMY     COLONOSCOPY WITH PROPOFOL  N/A 07/24/2023   Procedure: COLONOSCOPY WITH PROPOFOL ;   Surgeon: Luke Salaam, MD;  Location: [facility] ENDOSCOPY;  Service: Gastroenterology;  Laterality: N/A;   COLONOSCOPY WITH PROPOFOL  N/A 08/20/2023   Procedure: COLONOSCOPY WITH PROPOFOL ;  Surgeon: Luke Salaam, MD;  Location: [facility] ENDOSCOPY;  Service: Gastroenterology;  Laterality: N/A;    (Not in a hospital admission)  Social History   Socioeconomic History   Marital status: Divorced    Spouse name: Not on file   Number of children: Not on file   Years of education: Not on file   Highest education level: Not on file  Occupational History   Not on file  Tobacco Use   Smoking status: Never    Passive exposure: Never   Smokeless tobacco: Never  Vaping Use   Vaping status: Never Used  Substance and Sexual Activity   Alcohol use: Not Currently   Drug use: Not Currently   Sexual activity: Not on file  Other Topics Concern   Not on file  Social History Narrative   Not on file   Social Drivers of Health   Financial Resource Strain: Not on file  Food Insecurity: Not on file  Transportation Needs: Not on file  Physical Activity: Not on file  Stress: Not on file  Social Connections: Not on file  Intimate Partner Violence: Not on file    History reviewed. No pertinent family history.   Vitals:   02/11/24 0830 02/11/24 0930 02/11/24 1000 02/11/24 1042  BP: (!) 183/81 (!) 168/81 (!) 160/133   Pulse: (!) 57 68 61   Resp: 16 17    Temp:  97.7 F (36.5 C)  TempSrc:    Axillary  SpO2: 100% 100% 99%   Weight:        PHYSICAL EXAM General: Chronically ill appearing elderly male, well nourished, in no acute distress. HEENT: Normocephalic and atraumatic. Neck: No JVD.   Lungs: Normal respiratory effort on room air. Clear bilaterally to auscultation. No wheezes, crackles, rhonchi.  Heart: HRR, slower heart rate. Normal S1 and S2 without gallops or murmurs.  Abdomen: Non-distended appearing.  Msk: Normal strength and tone for age. Extremities: Warm and well perfused. No clubbing,  cyanosis. No edema.  Neuro: Alert and oriented X 3. Psych: Answers questions appropriately.   Labs: Basic Metabolic Panel: Recent Labs    02/11/24 0638  NA 136  K 3.3*  CL 105  CO2 23  GLUCOSE 165*  BUN 16  CREATININE 1.15  CALCIUM 8.6*  MG 1.7   Liver Function Tests: Recent Labs    02/11/24 0638  AST 22  ALT 14  ALKPHOS 44  BILITOT 0.6  PROT 7.0  ALBUMIN 3.8   No results for input(s): "LIPASE", "AMYLASE" in the last 72 hours. CBC: Recent Labs    02/11/24 0638  WBC 6.5  NEUTROABS 5.0  HGB 15.3  HCT 45.3  MCV 91.5  PLT 122*   Cardiac Enzymes: Recent Labs    02/11/24 0638  TROPONINIHS 6   BNP: No results for input(s): "BNP" in the last 72 hours. D-Dimer: No results for input(s): "DDIMER" in the last 72 hours. Hemoglobin A1C: No results for input(s): "HGBA1C" in the last 72 hours. Fasting Lipid Panel: No results for input(s): "CHOL", "HDL", "LDLCALC", "TRIG", "CHOLHDL", "LDLDIRECT" in the last 72 hours. Thyroid  Function Tests: No results for input(s): "TSH", "T4TOTAL", "T3FREE", "THYROIDAB" in the last 72 hours.  Invalid input(s): "FREET3" Anemia Panel: No results for input(s): "VITAMINB12", "FOLATE", "FERRITIN", "TIBC", "IRON", "RETICCTPCT" in the last 72 hours.   Radiology: US  Venous Img Lower Unilateral Right Result Date: 02/11/2024 CLINICAL DATA:  Right lower extremity edema EXAM: RIGHT LOWER EXTREMITY VENOUS DOPPLER ULTRASOUND TECHNIQUE: Gray-scale sonography with compression, as well as color and duplex ultrasound, were performed to evaluate the deep venous system(s) from the level of the common femoral vein through the popliteal and proximal calf veins. COMPARISON:  None Available. FINDINGS: VENOUS Normal compressibility of the common femoral, superficial femoral, and popliteal veins, as well as the visualized calf veins. Visualized portions of profunda femoral vein and great saphenous vein unremarkable. No filling defects to suggest DVT on grayscale  or color Doppler imaging. Doppler waveforms show normal direction of venous flow, normal respiratory plasticity and response to augmentation. Limited views of the contralateral common femoral vein are unremarkable. OTHER Edema is present within the superficial soft tissues. Limitations: none IMPRESSION: 1. No evidence of right lower extremity DVT. Electronically Signed   By: Reagan Camera M.D.   On: 02/11/2024 08:43   CT Head Wo Contrast Result Date: 02/11/2024 CLINICAL DATA:  Minor head trauma EXAM: CT HEAD WITHOUT CONTRAST CT CERVICAL SPINE WITHOUT CONTRAST TECHNIQUE: Multidetector CT imaging of the head and cervical spine was performed following the standard protocol without intravenous contrast. Multiplanar CT image reconstructions of the cervical spine were also generated. RADIATION DOSE REDUCTION: This exam was performed according to the departmental dose-optimization program which includes automated exposure control, adjustment of the mA and/or kV according to patient size and/or use of iterative reconstruction technique. COMPARISON:  04/14/2023 FINDINGS: CT HEAD FINDINGS Brain: No evidence of acute infarction, hemorrhage, hydrocephalus, extra-axial collection or  mass lesion/mass effect. Advanced and generalized cortical atrophy. Vascular: No hyperdense vessel or unexpected calcification. Skull: Normal. Negative for fracture or focal lesion. Sinuses/Orbits: No evidence of injury CT CERVICAL SPINE FINDINGS Alignment: Normal. Skull base and vertebrae: No acute fracture. No primary bone lesion or focal pathologic process. Soft tissues and spinal canal: No prevertebral fluid or swelling. No visible canal hematoma. Disc levels: Generalized degenerative disc space narrowing and endplate spurring. Upper chest: No evidence of injury IMPRESSION: No evidence of acute intracranial or cervical spine injury. Advanced cortical atrophy. Electronically Signed   By: Ronnette Coke M.D.   On: 02/11/2024 07:19   CT Cervical  Spine Wo Contrast Result Date: 02/11/2024 CLINICAL DATA:  Minor head trauma EXAM: CT HEAD WITHOUT CONTRAST CT CERVICAL SPINE WITHOUT CONTRAST TECHNIQUE: Multidetector CT imaging of the head and cervical spine was performed following the standard protocol without intravenous contrast. Multiplanar CT image reconstructions of the cervical spine were also generated. RADIATION DOSE REDUCTION: This exam was performed according to the departmental dose-optimization program which includes automated exposure control, adjustment of the mA and/or kV according to patient size and/or use of iterative reconstruction technique. COMPARISON:  04/14/2023 FINDINGS: CT HEAD FINDINGS Brain: No evidence of acute infarction, hemorrhage, hydrocephalus, extra-axial collection or mass lesion/mass effect. Advanced and generalized cortical atrophy. Vascular: No hyperdense vessel or unexpected calcification. Skull: Normal. Negative for fracture or focal lesion. Sinuses/Orbits: No evidence of injury CT CERVICAL SPINE FINDINGS Alignment: Normal. Skull base and vertebrae: No acute fracture. No primary bone lesion or focal pathologic process. Soft tissues and spinal canal: No prevertebral fluid or swelling. No visible canal hematoma. Disc levels: Generalized degenerative disc space narrowing and endplate spurring. Upper chest: No evidence of injury IMPRESSION: No evidence of acute intracranial or cervical spine injury. Advanced cortical atrophy. Electronically Signed   By: Ronnette Coke M.D.   On: 02/11/2024 07:19    ECHO ordered  TELEMETRY reviewed by me 02/11/2024: sinus bradycardia, rate 50s (longest sinus pause 2.1 s, no sign of high grade AVB, hx of chronic bradycardia)  EKG reviewed by me: sinus bradycardia with short sinus pause, rate 58 bpm   Data reviewed by me 02/11/2024: last 24h vitals tele labs imaging I/O ED provider note, admission H&P.  Principal Problem:   Bradycardia with less than 30 beats per minute    ASSESSMENT  AND PLAN:  Seth Thompson is a 78 y.o. male  with a past medical history of hypertension, sinus bradycardia, severe alzheimer dementia who presented to the ED on 02/11/2024 for syncope. Cardiology was consulted for further evaluation.    # Bradycardia # Syncope # Alzheimer dementia  # Hypertensive Patient with severe dementia reports to ED with bradycardia and a witnessed syncopal event. Trop neg x 1. EKG in ED and per tele patient sinus bradycardia with sinus pauses that are less than 3 seconds. Longest pause was 2.1 seconds. No evidence of high grade AV block. HR stable this afternoon. BP elevated.  -Echo ordered.  -Avoid AVN blockers due to patients bradycardia.  -Will hold off on starting antihypertensive medications due to borderline HR.  -Will continue to monitor tele. No indication for PPM at this time as patient has no evidence of high grade AV block and no evidence of sinus pause longer than 3 seconds.    This patient's plan of care was discussed and created with Dr. Bob Burn and he is in agreement.  Signed: Creighton Doffing, PA-C  02/11/2024, 1:25 PM [facility]

## 2024-02-11 NOTE — ED Notes (Signed)
 Pt continues to try to get out of the bed. Staff continues to try to verbally redirect and distract pt from getting out of the bed. Pt is getting harder to redirect. Mitts are slightly helpful in keeping pt in the bed.

## 2024-02-12 ENCOUNTER — Inpatient Hospital Stay: Admit: 2024-02-12 | Discharge: 2024-02-12 | Disposition: A | Discharge status: Home / Self Care

## 2024-02-12 ENCOUNTER — Encounter: Payer: Self-pay | Admitting: Internal Medicine

## 2024-02-12 DIAGNOSIS — R001 Bradycardia, unspecified: Secondary | ICD-10-CM | POA: Diagnosis not present

## 2024-02-12 DIAGNOSIS — K4030 Unilateral inguinal hernia, with obstruction, without gangrene, not specified as recurrent: Secondary | ICD-10-CM | POA: Diagnosis not present

## 2024-02-12 LAB — ECHOCARDIOGRAM COMPLETE
AR max vel: 3.41 cm2
AV Area VTI: 3.53 cm2
AV Area mean vel: 3.65 cm2
AV Mean grad: 2 mmHg
AV Peak grad: 4.4 mmHg
Ao pk vel: 1.05 m/s
Area-P 1/2: 2.93 cm2
S' Lateral: 2.5 cm
Weight: 3171.2 [oz_av]

## 2024-02-12 LAB — CBC
HCT: 45.9 % (ref 39.0–52.0)
Hemoglobin: 16.1 g/dL (ref 13.0–17.0)
MCH: 31.4 pg (ref 26.0–34.0)
MCHC: 35.1 g/dL (ref 30.0–36.0)
MCV: 89.5 fL (ref 80.0–100.0)
Platelets: 125 10*3/uL — ABNORMAL LOW (ref 150–400)
RBC: 5.13 MIL/uL (ref 4.22–5.81)
RDW: 13.1 % (ref 11.5–15.5)
WBC: 5.6 10*3/uL (ref 4.0–10.5)
nRBC: 0 % (ref 0.0–0.2)

## 2024-02-12 LAB — BASIC METABOLIC PANEL WITH GFR
Anion gap: 12 (ref 5–15)
BUN: 12 mg/dL (ref 8–23)
CO2: 22 mmol/L (ref 22–32)
Calcium: 9.2 mg/dL (ref 8.9–10.3)
Chloride: 105 mmol/L (ref 98–111)
Creatinine, Ser: 0.92 mg/dL (ref 0.61–1.24)
GFR, Estimated: >60 mL/min (ref >60)
Glucose, Bld: 119 mg/dL — ABNORMAL HIGH (ref 70–99)
Potassium: 3.9 mmol/L (ref 3.5–5.1)
Sodium: 139 mmol/L (ref 135–145)

## 2024-02-12 LAB — TROPONIN I (HIGH SENSITIVITY): Troponin I (High Sensitivity): 11 ng/L (ref <18)

## 2024-02-12 MED ORDER — AMLODIPINE BESYLATE 10 MG PO TABS
10.0000 mg | ORAL_TABLET | Freq: Every day | ORAL | Status: DC
Start: 2024-02-12 — End: 2024-02-12

## 2024-02-12 NOTE — Progress Notes (Signed)
*  PRELIMINARY RESULTS* Echocardiogram 2D Echocardiogram has been performed.  Seth Thompson 02/12/2024, 1:39 PM

## 2024-02-12 NOTE — ED Notes (Signed)
 Sent second trop down to lab. Lab states they did not get it. Sending trop for a second time now.

## 2024-02-12 NOTE — Plan of Care (Signed)
  Problem: Pain Managment: Goal: General experience of comfort will improve and/or be controlled Outcome: Progressing   Problem: Safety: Goal: Ability to remain free from injury will improve Outcome: Progressing   Problem: Skin Integrity: Goal: Risk for impaired skin integrity will decrease Outcome: Progressing   Problem: Safety: Goal: Non-violent Restraint(s) Outcome: Progressing

## 2024-02-12 NOTE — Progress Notes (Signed)
 Seth Thompson       Patient ID: Seth Thompson MRN: [id number] DOB/AGE: 1946/08/25 78 y.o.  Admit date: 02/11/2024 Referring Physician Dr. Alvenia Aus Primary Physician Donold Galla, [facility] Primary Cardiologist Kolwalksi (last seen 2022) Reason for Consultation bradycardia, syncope  HPI: Seth Thompson is a 78 y.o. male  with a past medical history of hypertension, sinus bradycardia, severe alzheimer dementia who presented to the ED on 02/11/2024 for syncope. Cardiology was consulted for further evaluation.   Interval History: -Patient with severe dementia, seen and examined this AM and pt laying comfortably in hospital. -Patients BP elevated and HR stable overnight. Overnight Tele showed no significant events. No significant bradycardia, no sustained HR < 50, no significant AVB or significant pauses.  -Per RN no syncopal episodes overnight.  -Electrolytes remain stable.   Review of systems complete and found to be negative unless listed above    Past Medical History:  Diagnosis Date   Benign essential HTN 02/03/2018   Dementia (HCC)    Diverticulitis of large intestine with abscess without bleeding 04/03/2018   Frequent PVCs 07/02/2016   Hyperlipidemia    Right inguinal hernia 04/01/2023    Past Surgical History:  Procedure Laterality Date   CHOLECYSTECTOMY     COLONOSCOPY WITH PROPOFOL  N/A 07/24/2023   Procedure: COLONOSCOPY WITH PROPOFOL ;  Surgeon: Luke Salaam, MD;  Location: [facility] ENDOSCOPY;  Service: Gastroenterology;  Laterality: N/A;   COLONOSCOPY WITH PROPOFOL  N/A 08/20/2023   Procedure: COLONOSCOPY WITH PROPOFOL ;  Surgeon: Luke Salaam, MD;  Location: [facility] ENDOSCOPY;  Service: Gastroenterology;  Laterality: N/A;    Medications Prior to Admission  Medication Sig Dispense Refill Last Dose/Taking   cyanocobalamin 1000 MCG tablet Take 1,000 mcg by mouth daily.   02/10/2024 at  5:36 AM   guaifenesin (ROBITUSSIN) 100 MG/5ML syrup Take 10 mLs  by mouth every 4 (four) hours as needed for cough or congestion.   Taking As Needed   melatonin 5 MG TABS Take 5 mg by mouth at bedtime.   02/10/2024 at  5:38 PM   Multiple Vitamin (MULTIVITAMIN) capsule Take 1 capsule by mouth daily.   02/10/2024 at  5:36 AM   polyethylene glycol (MIRALAX / GLYCOLAX) 17 g packet Take 17 g by mouth daily as needed for mild constipation.   Taking As Needed   senna (SENOKOT) 8.6 MG TABS tablet Take 1 tablet by mouth 3 (three) times a week. Mondays, Wednesday, and Friday   02/09/2024   sertraline (ZOLOFT) 50 MG tablet Take 50 mg by mouth at bedtime.   02/10/2024 at  5:38 PM   simethicone (MYLICON) 125 MG chewable tablet Chew 125 mg by mouth every 6 (six) hours as needed (gas/burping).   Taking As Needed   Social History   Socioeconomic History   Marital status: Divorced    Spouse name: Not on file   Number of children: Not on file   Years of education: Not on file   Highest education level: Not on file  Occupational History   Not on file  Tobacco Use   Smoking status: Never    Passive exposure: Never   Smokeless tobacco: Never  Vaping Use   Vaping status: Never Used  Substance and Sexual Activity   Alcohol use: Not Currently   Drug use: Not Currently   Sexual activity: Not on file  Other Topics Concern   Not on file  Social History Narrative   Not on file   Social Drivers of Health  Financial Resource Strain: Not on file  Food Insecurity: No Food Insecurity (02/11/2024)   Hunger Vital Sign    Worried About Running Out of Food in the Last Year: Never true    Ran Out of Food in the Last Year: Never true  Transportation Needs: No Transportation Needs (02/11/2024)   PRAPARE - Administrator, Civil Service (Medical): No    Lack of Transportation (Non-Medical): No  Physical Activity: Not on file  Stress: Not on file  Social Connections: Socially Isolated (02/11/2024)   Social Connection and Isolation Panel [NHANES]    Frequency of Communication  with Friends and Family: Never    Frequency of Social Gatherings with Friends and Family: Twice a week    Attends Religious Services: Never    Database administrator or Organizations: No    Attends Banker Meetings: Never    Marital Status: Divorced  Catering manager Violence: Patient Unable To Answer (02/11/2024)   Humiliation, Afraid, Rape, and Kick questionnaire    Fear of Current or Ex-Partner: Patient unable to answer    Emotionally Abused: Patient unable to answer    Physically Abused: Patient unable to answer    Sexually Abused: Patient unable to answer    History reviewed. No pertinent family history.   Vitals:   02/12/24 0406 02/12/24 0417 02/12/24 0555 02/12/24 0812  BP: (!) 194/81 (!) 178/85 (!) 149/72 (!) 154/70  Pulse:  66 79 70  Resp:   18 20  Temp:   98.4 F (36.9 C) 98.5 F (36.9 C)  TempSrc:      SpO2:    95%  Weight:        PHYSICAL EXAM General: Chronically ill appearing elderly male, well nourished, in no acute distress. HEENT: Normocephalic and atraumatic. Neck: No JVD.   Lungs: Normal respiratory effort on room air. Clear bilaterally to auscultation. No wheezes, crackles, rhonchi.  Heart: HRR, slower heart rate. Normal S1 and S2 without gallops or murmurs.  Abdomen: Non-distended appearing.  Msk: Normal strength and tone for age. Extremities: Warm and well perfused. No clubbing, cyanosis. No edema.   Labs: Basic Metabolic Panel: Recent Labs    02/11/24 0638 02/12/24 0523  NA 136 139  K 3.3* 3.9  CL 105 105  CO2 23 22  GLUCOSE 165* 119*  BUN 16 12  CREATININE 1.15 0.92  CALCIUM 8.6* 9.2  MG 1.7  --    Liver Function Tests: Recent Labs    02/11/24 0638  AST 22  ALT 14  ALKPHOS 44  BILITOT 0.6  PROT 7.0  ALBUMIN 3.8   No results for input(s): "LIPASE", "AMYLASE" in the last 72 hours. CBC: Recent Labs    02/11/24 0638 02/12/24 0523  WBC 6.5 5.6  NEUTROABS 5.0  --   HGB 15.3 16.1  HCT 45.3 45.9  MCV 91.5 89.5  PLT  122* 125*   Cardiac Enzymes: Recent Labs    02/11/24 0638 02/12/24 0116  TROPONINIHS 6 11   BNP: No results for input(s): "BNP" in the last 72 hours. D-Dimer: No results for input(s): "DDIMER" in the last 72 hours. Hemoglobin A1C: No results for input(s): "HGBA1C" in the last 72 hours. Fasting Lipid Panel: No results for input(s): "CHOL", "HDL", "LDLCALC", "TRIG", "CHOLHDL", "LDLDIRECT" in the last 72 hours. Thyroid  Function Tests: No results for input(s): "TSH", "T4TOTAL", "T3FREE", "THYROIDAB" in the last 72 hours.  Invalid input(s): "FREET3" Anemia Panel: No results for input(s): "VITAMINB12", "FOLATE", "FERRITIN", "TIBC", "IRON", "RETICCTPCT"  in the last 72 hours.   Radiology: CT ABDOMEN PELVIS W CONTRAST Result Date: 02/11/2024 CLINICAL DATA:  Right inguinal hernia. Concern for bowel obstruction. EXAM: CT ABDOMEN AND PELVIS WITH CONTRAST TECHNIQUE: Multidetector CT imaging of the abdomen and pelvis was performed using the standard protocol following bolus administration of intravenous contrast. RADIATION DOSE REDUCTION: This exam was performed according to the departmental dose-optimization program which includes automated exposure control, adjustment of the mA and/or kV according to patient size and/or use of iterative reconstruction technique. CONTRAST:  OMNIPAQUE  IOHEXOL  300 MG/ML  SOLN COMPARISON:  CT abdomen pelvis dated 03/08/2023. FINDINGS: Lower chest: Bibasilar atelectasis/scarring. The visualized lung bases are otherwise clear. Coronary vascular calcification. No intra-abdominal free air or free fluid. Hepatobiliary: The liver is unremarkable. There is mild dilatation, post cholecystectomy. No retained calcified stone noted in the central CBD. Pancreas: Unremarkable. No pancreatic ductal dilatation or surrounding inflammatory changes. Spleen: Normal in size without focal abnormality. Adrenals/Urinary Tract: The adrenal glands unremarkable. There is a 2.5 cm right renal  inferior pole cyst. There is a 4 mm nonobstructing left renal interpolar stone. There is no hydronephrosis on either side. There is symmetric enhancement and excretion of contrast by both kidneys. The visualized ureters and urinary bladder appear unremarkable. Stomach/Bowel: There is a large right inguinal hernia containing multiple loops of distal small bowel. There is no bowel obstruction. There is sigmoid diverticulosis. The appendix is normal. Vascular/Lymphatic: Mild aortoiliac atherosclerotic disease. The IVC is unremarkable. No portal venous gas. There is no adenopathy. Reproductive: The prostate and seminal vesicles are grossly unremarkable. No pelvic mass. Other: None Musculoskeletal: Osteopenia with degenerative changes of the spine. No acute osseous pathology. IMPRESSION: 1. Large right inguinal hernia containing multiple loops of distal small bowel. No bowel obstruction. 2. Sigmoid diverticulosis. Normal appendix. 3. A 4 mm nonobstructing left renal interpolar stone. No hydronephrosis. 4.  Aortic Atherosclerosis (ICD10-I70.0). Electronically Signed   By: Angus Bark M.D.   On: 02/11/2024 13:56   US  Venous Img Lower Unilateral Right Result Date: 02/11/2024 CLINICAL DATA:  Right lower extremity edema EXAM: RIGHT LOWER EXTREMITY VENOUS DOPPLER ULTRASOUND TECHNIQUE: Gray-scale sonography with compression, as well as color and duplex ultrasound, were performed to evaluate the deep venous system(s) from the level of the common femoral vein through the popliteal and proximal calf veins. COMPARISON:  None Available. FINDINGS: VENOUS Normal compressibility of the common femoral, superficial femoral, and popliteal veins, as well as the visualized calf veins. Visualized portions of profunda femoral vein and great saphenous vein unremarkable. No filling defects to suggest DVT on grayscale or color Doppler imaging. Doppler waveforms show normal direction of venous flow, normal respiratory plasticity and  response to augmentation. Limited views of the contralateral common femoral vein are unremarkable. OTHER Edema is present within the superficial soft tissues. Limitations: none IMPRESSION: 1. No evidence of right lower extremity DVT. Electronically Signed   By: Reagan Camera M.D.   On: 02/11/2024 08:43   CT Head Wo Contrast Result Date: 02/11/2024 CLINICAL DATA:  Minor head trauma EXAM: CT HEAD WITHOUT CONTRAST CT CERVICAL SPINE WITHOUT CONTRAST TECHNIQUE: Multidetector CT imaging of the head and cervical spine was performed following the standard protocol without intravenous contrast. Multiplanar CT image reconstructions of the cervical spine were also generated. RADIATION DOSE REDUCTION: This exam was performed according to the departmental dose-optimization program which includes automated exposure control, adjustment of the mA and/or kV according to patient size and/or use of iterative reconstruction technique. COMPARISON:  04/14/2023 FINDINGS: CT HEAD  FINDINGS Brain: No evidence of acute infarction, hemorrhage, hydrocephalus, extra-axial collection or mass lesion/mass effect. Advanced and generalized cortical atrophy. Vascular: No hyperdense vessel or unexpected calcification. Skull: Normal. Negative for fracture or focal lesion. Sinuses/Orbits: No evidence of injury CT CERVICAL SPINE FINDINGS Alignment: Normal. Skull base and vertebrae: No acute fracture. No primary bone lesion or focal pathologic process. Soft tissues and spinal canal: No prevertebral fluid or swelling. No visible canal hematoma. Disc levels: Generalized degenerative disc space narrowing and endplate spurring. Upper chest: No evidence of injury IMPRESSION: No evidence of acute intracranial or cervical spine injury. Advanced cortical atrophy. Electronically Signed   By: Ronnette Coke M.D.   On: 02/11/2024 07:19   CT Cervical Spine Wo Contrast Result Date: 02/11/2024 CLINICAL DATA:  Minor head trauma EXAM: CT HEAD WITHOUT CONTRAST CT  CERVICAL SPINE WITHOUT CONTRAST TECHNIQUE: Multidetector CT imaging of the head and cervical spine was performed following the standard protocol without intravenous contrast. Multiplanar CT image reconstructions of the cervical spine were also generated. RADIATION DOSE REDUCTION: This exam was performed according to the departmental dose-optimization program which includes automated exposure control, adjustment of the mA and/or kV according to patient size and/or use of iterative reconstruction technique. COMPARISON:  04/14/2023 FINDINGS: CT HEAD FINDINGS Brain: No evidence of acute infarction, hemorrhage, hydrocephalus, extra-axial collection or mass lesion/mass effect. Advanced and generalized cortical atrophy. Vascular: No hyperdense vessel or unexpected calcification. Skull: Normal. Negative for fracture or focal lesion. Sinuses/Orbits: No evidence of injury CT CERVICAL SPINE FINDINGS Alignment: Normal. Skull base and vertebrae: No acute fracture. No primary bone lesion or focal pathologic process. Soft tissues and spinal canal: No prevertebral fluid or swelling. No visible canal hematoma. Disc levels: Generalized degenerative disc space narrowing and endplate spurring. Upper chest: No evidence of injury IMPRESSION: No evidence of acute intracranial or cervical spine injury. Advanced cortical atrophy. Electronically Signed   By: Ronnette Coke M.D.   On: 02/11/2024 07:19    ECHO ordered  TELEMETRY reviewed by me 02/12/2024: sinus rhythm, rate 70s (no significant events overnight on tele)  EKG reviewed by me: sinus bradycardia with short sinus pause, rate 58 bpm   Data reviewed by me 02/12/2024: last 24h vitals tele labs imaging I/O hospitalist progress Thompson.  Principal Problem:   Bradycardia with less than 30 beats per minute Active Problems:   Incarcerated right inguinal hernia   Syncope   Alzheimer's disease (HCC)    ASSESSMENT AND PLAN:  JEFFRAY STONEY is a 78 y.o. male  with a past  medical history of hypertension, sinus bradycardia, severe alzheimer dementia who presented to the ED on 02/11/2024 for syncope. Cardiology was consulted for further evaluation.    # Bradycardia # Syncope # Alzheimer dementia  # Hypertensive Patient with severe dementia reports to ED with bradycardia and a witnessed syncopal event. Trop neg x 1. EKG in ED and per tele patient sinus bradycardia with sinus pauses that are less than 3 seconds. Longest pause was 2.1 seconds on tele on 05/07. Per tele there has been no significant bradycardia, no sustained HR < 50, no significant AVB or significant pauses since admission. HR stable and BP elevated this AM.  -Echo ordered.  -Avoid AVN blockers due to patients bradycardia.  -Will hold off on starting antihypertensive medications due to borderline HR.  -Will continue to monitor tele. No indication for PPM at this time as patient has no evidence of high grade AV block and no evidence of sinus pause longer than 3 seconds,  especially with patients severe dementia. Most likely vasovagal syncope.   This patient's plan of care was discussed and created with Dr. Bob Burn and he is in agreement.  Signed: Creighton Doffing, PA-C  02/12/2024, 9:17 AM [facility] Cardiology

## 2024-02-12 NOTE — Plan of Care (Signed)
  Problem: Nutrition: Goal: Adequate nutrition will be maintained Outcome: Progressing   Problem: Coping: Goal: Level of anxiety will decrease Outcome: Progressing   Problem: Elimination: Goal: Will not experience complications related to bowel motility Outcome: Progressing Goal: Will not experience complications related to urinary retention Outcome: Progressing   Problem: Pain Managment: Goal: General experience of comfort will improve and/or be controlled Outcome: Progressing   Problem: Safety: Goal: Ability to remain free from injury will improve Outcome: Progressing   Problem: Skin Integrity: Goal: Risk for impaired skin integrity will decrease Outcome: Progressing   Problem: Safety: Goal: Non-violent Restraint(s) Outcome: Progressing

## 2024-02-12 NOTE — Progress Notes (Signed)
 [facility] SURGICAL PROGRESS NOTE (cpt [phone])  Hospital Day(s): 1.   Post op day(s):  Seth Thompson   Interval History: Patient seen and examined, no acute events or new complaints overnight. Patient remains quite confused, currently agitated following cleaning up after bowel movement.  Review of Systems:  Not obtainable due to dementia.  Vital signs in last 24 hours: [min-max] current  Temp:  [97.7 F (36.5 C)-98.7 F (37.1 C)] 98.4 F (36.9 C) (05/08 0555) Pulse Rate:  [45-79] 79 (05/08 0555) Resp:  [12-35] 18 (05/08 0555) BP: (125-199)/(67-148) 149/72 (05/08 0555) SpO2:  [92 %-100 %] 98 % (05/08 0224)       Weight: 89.9 kg     Intake/Output last 2 shifts:  05/07 0701 - 05/08 0700 In: -  Out: 200 [Urine:200]   Physical Exam:  Constitutional: alert, confused.  Mildly agitated. HENT: normocephalic without obvious abnormality  Neuro: Moving all extremities without focal defect Respiratory: breathing non-labored at rest  Cardiovascular: regular rate and sinus rhythm  Gastrointestinal: soft, non-tender, and non-distended, hernia soft partially to completely reducible. Musculoskeletal: UE and LE FROM, no edema or wounds, motor and sensation grossly intact, NT    Labs:     Latest Ref Rng & Units 02/12/2024    5:23 AM 02/11/2024    6:38 AM 04/14/2023    6:37 PM  CBC  WBC 4.0 - 10.5 K/uL 5.6  6.5  10.4   Hemoglobin 13.0 - 17.0 g/dL 19.1  47.8  29.5   Hematocrit 39.0 - 52.0 % 45.9  45.3  50.4   Platelets 150 - 400 K/uL 125  122  160       Latest Ref Rng & Units 02/12/2024    5:23 AM 02/11/2024    6:38 AM 04/14/2023    6:37 PM  CMP  Glucose 70 - 99 mg/dL 621  308  657   BUN 8 - 23 mg/dL 12  16  21    Creatinine 0.61 - 1.24 mg/dL 8.46  9.62  9.52   Sodium 135 - 145 mmol/L 139  136  139   Potassium 3.5 - 5.1 mmol/L 3.9  3.3  3.8   Chloride 98 - 111 mmol/L 105  105  106   CO2 22 - 32 mmol/L 22  23  20    Calcium 8.9 - 10.3 mg/dL 9.2  8.6  9.8   Total Protein 6.5 - 8.1 g/dL   7.0    Total Bilirubin 0.0 - 1.2 mg/dL  0.6    Alkaline Phos 38 - 126 U/L  44    AST 15 - 41 U/L  22    ALT 0 - 44 U/L  14      Imaging studies: No new pertinent imaging studies   Assessment/Plan:  78 y.o. male with right inguinal hernia, reducible.   s/p admission for fall and complications of dementia, complicated by pertinent comorbidities including: Patient Active Problem List   Diagnosis Date Noted   Bradycardia with less than 30 beats per minute 02/11/2024   Incarcerated right inguinal hernia 02/11/2024   Syncope 02/11/2024   Alzheimer's disease (HCC) 02/11/2024   Right inguinal hernia 04/01/2023   Diarrhea 02/02/2019   Benign essential HTN 02/03/2018   Mild dementia (HCC) 11/25/2017   Mild cognitive impairment 05/27/2017   Loss of memory 02/04/2017   Frequent PVCs 07/02/2016   Palpitations 06/27/2016   Bradycardia 02/22/2016   Mixed hyperlipidemia 05/10/2014     - Would defer elective hernia surgery for now considering  this gentleman's current state of dementia.  No evidence of obstruction or incarceration, nor worries regarding strangulation.  Bowels clearly moving and functioning without issue.  - Surgery to sign off, please reconsult for any new issues that arise.       -- Flynn Hylan M.D., [facility] 02/12/2024 7:26 AM

## 2024-02-12 NOTE — Progress Notes (Signed)
 PT Cancellation Note  Patient Details Name: Seth Thompson MRN: [id number] DOB: [birth year]   Cancelled Treatment:    Reason Eval/Treat Not Completed: Patient's level of consciousness Chart reviewed, case discussed with nurse.  She reports pt continues to be agitated and that he is not appropriate for PT at this time.  Will attempt PT eval at a later date when he is appropriate.   Darice Edelman, DPT 02/12/2024, 6:06 PM

## 2024-02-12 NOTE — Progress Notes (Signed)
 Progress Note   Patient: Seth Thompson UVO:[id number] DOB: 04-18-1946 DOA: 02/11/2024     1 DOS: the patient was seen and examined on 02/12/2024   Brief hospital course:  Seth Thompson is a 78 y.o. male with medical history significant of essential hypertension, dementia, frequent PVCs, hyperlipidemia who was brought in from a facility after the patient lost consciousness while sitting on the shower chair at the facility.  At the time patient was being seen he was pleasantly confused and so unable to contribute to history.  History was obtained from chart review as well as discussion with the ED physician Upon arrival to the emergency room patient was found to be bradycardic in the 50s and had a drop of his pulse into the 20s.  Hospitalist service was therefore contacted to admit patient for further management.  ED physician also spoke with cardiologist who will be seeing patient in consultation.  Patient also found to have incidental right inguinal hernia that was irreducible and as such general surgeon has been consulted.  Assessment and Plan:  Acute metabolic encephalopathy. CT scan of the brain did not show any acute intracranial pathology Patient still confused No evidence of infection detected Continue delirium precaution Monitor neurochecks closely Patient currently requiring one-to-one sitter  Syncope in the setting of bradycardia-improved Upon arrival to the emergency room patient was found to be bradycardic in the 50s and had a drop of his pulse into the 20s Cardiologist on board and case discussed Follow-up on echocardiogram I have discussed the case with cardiology team At this point no permanent pacemaker placement recommended    Hypokalemia-continue repletion and monitoring   Incarcerated right sided inguinal hernia I have discussed the case with general surgery Continue as needed pain medication   Essential hypertension, Avoid AV nodal blocking agents Other  antihypertensives on hold as recommended by cardiology Patient placed on as needed hydralazine for systolic blood pressure greater than 170   Severe dementia with behavioral disturbances Continue delirium precaution One-to-one sitter   Hyperlipidemia-does not appear to be on statin   DVT prophylaxis-continue Lovenox     Advance Care Planning:   Code Status: Prior full code   Consults: Cardiology, general surgery   Family Communication: None present at bedside   Subjective:  Patient seen and examined at bedside this morning Appears confused currently requiring upper extremity restraints Unable to obtain subjective information given the above  Physical Exam:  General: He is not in acute distress.  Pleasantly confused    Appearance: He is not ill-appearing.  Cardiovascular:     Rate and Rhythm: Normal rate and regular rhythm.     Heart sounds: Normal heart sounds.  Pulmonary:     Effort: Pulmonary effort is normal. No accessory muscle usage.  Neurological:     General: No focal deficit present.  Confused but moving all extremities equally Psychiatric:    Confused Abdomen: Full nondistended, tenderness noted to the right inguinal area, right-sided inguinal hernia noted   Vitals:   02/12/24 0406 02/12/24 0417 02/12/24 0555 02/12/24 0812  BP: (!) 194/81 (!) 178/85 (!) 149/72 (!) 154/70  Pulse:  66 79 70  Resp:   18 20  Temp:   98.4 F (36.9 C) 98.5 F (36.9 C)  TempSrc:      SpO2:    95%  Weight:        Data Reviewed:  CT scan of the brain reviewed that did not show any acute intracranial pathology    Latest Ref  Rng & Units 02/12/2024    5:23 AM 02/11/2024    6:38 AM 04/14/2023    6:37 PM  CBC  WBC 4.0 - 10.5 K/uL 5.6  6.5  10.4   Hemoglobin 13.0 - 17.0 g/dL 62.3  76.2  83.1   Hematocrit 39.0 - 52.0 % 45.9  45.3  50.4   Platelets 150 - 400 K/uL 125  122  160        Latest Ref Rng & Units 02/12/2024    5:23 AM 02/11/2024    6:38 AM 04/14/2023    6:37 PM  BMP   Glucose 70 - 99 mg/dL 517  616  073   BUN 8 - 23 mg/dL 12  16  21    Creatinine 0.61 - 1.24 mg/dL 7.10  6.26  9.48   Sodium 135 - 145 mmol/L 139  136  139   Potassium 3.5 - 5.1 mmol/L 3.9  3.3  3.8   Chloride 98 - 111 mmol/L 105  105  106   CO2 22 - 32 mmol/L 22  23  20    Calcium 8.9 - 10.3 mg/dL 9.2  8.6  9.8       Time spent: 56 minutes  Author: Ezzard Holms, MD 02/12/2024 5:00 PM  For on call review [email or web address].

## 2024-02-13 DIAGNOSIS — R001 Bradycardia, unspecified: Secondary | ICD-10-CM | POA: Diagnosis not present

## 2024-02-13 LAB — FOLATE: Folate: 17.4 ng/mL (ref >5.9)

## 2024-02-13 LAB — TSH: TSH: 3.206 u[IU]/mL (ref 0.350–4.500)

## 2024-02-13 MED ORDER — AMLODIPINE BESYLATE 5 MG PO TABS
5.0000 mg | ORAL_TABLET | Freq: Every day | ORAL | Status: DC
  Administered 2024-02-13 – 2024-02-16 (×4): 5 mg via ORAL
  Filled 2024-02-13 (×4): qty 1

## 2024-02-13 NOTE — Progress Notes (Signed)
 Progress Note   Patient: Seth Thompson QIO:[id number] DOB: 1946-08-16 DOA: 02/11/2024     2 DOS: the patient was seen and examined on 02/13/2024    Brief hospital course:  Seth Thompson is a 78 y.o. male with medical history significant of essential hypertension, dementia, frequent PVCs, hyperlipidemia who was brought in from a facility after the patient lost consciousness while sitting on the shower chair at the facility.  At the time patient was being seen he was pleasantly confused and so unable to contribute to history.  History was obtained from chart review as well as discussion with the ED physician Upon arrival to the emergency room patient was found to be bradycardic in the 50s and had a drop of his pulse into the 20s.  Hospitalist service was therefore contacted to admit patient for further management.  ED physician also spoke with cardiologist who will be seeing patient in consultation.  Patient also found to have incidental right inguinal hernia that was irreducible and as such general surgeon has been consulted.   Assessment and Plan:   Acute metabolic encephalopathy. CT scan of the brain did not show any acute intracranial pathology Patient still confused No evidence of infection detected Continue delirium precaution Monitor neurochecks closely Patient currently requiring one-to-one sitter Follow-up TSH, B12 as well as thiamine levels and folic acid  Syncope in the setting of bradycardia-improved Upon arrival to the emergency room patient was found to be bradycardic in the 50s and had a drop of his pulse into the 20s Cardiologist on board and case discussed Follow-up on echocardiogram I have discussed the case with cardiology team At this point no permanent pacemaker placement recommended     Hypokalemia-continue repletion and monitoring   Incarcerated right sided inguinal hernia I have discussed the case with general surgery Continue as needed pain medication    Essential hypertension, Avoid AV nodal blocking agents Other antihypertensives on hold as recommended by cardiology Patient placed on as needed hydralazine  for systolic blood pressure greater than 170   Severe dementia with behavioral disturbances Continue delirium precaution Continue one-to-one sitter   Hyperlipidemia-does not appear to be on statin   DVT prophylaxis-continue Lovenox     Advance Care Planning:   Code Status: Prior full code   Consults: Cardiology, general surgery   Family Communication: None present at bedside     Subjective:  Patient seen and examined at bedside this morning Patient is still confused requiring one-to-one monitoring   Physical Exam:  General: He is not in acute distress.  Pleasantly confused    Appearance: He is not ill-appearing.  Cardiovascular:     Rate and Rhythm: Normal rate and regular rhythm.     Heart sounds: Normal heart sounds.  Pulmonary:     Effort: Pulmonary effort is normal. No accessory muscle usage.  Neurological:     General: No focal deficit present.  Confused but moving all extremities equally Psychiatric:    Confused Abdomen: Full nondistended, tenderness noted to the right inguinal area, right-sided inguinal hernia noted   Data Reviewed:   CT scan of the brain reviewed that did not show any acute intracranial pathology  Vitals:   02/13/24 1520 02/13/24 1525 02/13/24 1530 02/13/24 1535  BP:    (!) 167/68  Pulse:    (!) 48  Resp: (!) 23 12 17 20   Temp:      TempSrc:      SpO2:    98%  Weight:  Latest Ref Rng & Units 02/12/2024    5:23 AM 02/11/2024    6:38 AM 04/14/2023    6:37 PM  CBC  WBC 4.0 - 10.5 K/uL 5.6  6.5  10.4   Hemoglobin 13.0 - 17.0 g/dL 30.8  65.7  84.6   Hematocrit 39.0 - 52.0 % 45.9  45.3  50.4   Platelets 150 - 400 K/uL 125  122  160        Latest Ref Rng & Units 02/12/2024    5:23 AM 02/11/2024    6:38 AM 04/14/2023    6:37 PM  BMP  Glucose 70 - 99 mg/dL 962  952  841   BUN 8 -  23 mg/dL 12  16  21    Creatinine 0.61 - 1.24 mg/dL 3.24  4.01  0.27   Sodium 135 - 145 mmol/L 139  136  139   Potassium 3.5 - 5.1 mmol/L 3.9  3.3  3.8   Chloride 98 - 111 mmol/L 105  105  106   CO2 22 - 32 mmol/L 22  23  20    Calcium 8.9 - 10.3 mg/dL 9.2  8.6  9.8      Author: Ezzard Holms, MD 02/13/2024 5:55 PM  For on call review [email or web address].

## 2024-02-13 NOTE — Evaluation (Signed)
 Physical Therapy Evaluation Patient Details Name: Seth Thompson MRN: [id number] DOB: 1945-11-01 Today's Date: 02/13/2024  History of Present Illness  Pt is a 78 year old male admitted after LOC in shower, admitted with acute metabolic encephalopathy, syncope in the setting of bradycardia, hypokalemia, incarcerated right sided inguinal hernia. PMH significant for essential hypertension, dementia, frequent PVCs, and hyperlipidemia.   Clinical Impression  Pt alert but unable to communicate verbally and required extensive multi-modal cuing for 1-step commands with pt rarely able to follow them.  Pt required extensive +2 physical assistance with all functional tasks per below.  Sitting balance initially was poor with posterior instability but did improve grossly as the session progressed.  Pt's standing balance remained near zero throughout with multiple standing attempts with pt requiring near total assist to clear the surface of the bed and to remain in standing.  Per staff at pt's facility he is independent with amb without an AD at baseline but does need extensive assist with ADLs.  Pt will benefit from continued PT services upon discharge to safely address deficits listed in patient problem list for decreased caregiver assistance and eventual return to PLOF.       If plan is discharge home, recommend the following: Two people to help with walking and/or transfers;A lot of help with bathing/dressing/bathroom;Direct supervision/assist for medications management;Supervision due to cognitive status;Assist for transportation   Can travel by private vehicle   No    Equipment Recommendations Other (comment) (TBD at next venue of care)  Recommendations for Other Services       Functional Status Assessment Patient has had a recent decline in their functional status and/or demonstrates limited ability to make significant improvements in function in a reasonable and predictable amount of time      Precautions / Restrictions Precautions Precautions: Fall Recall of Precautions/Restrictions: Impaired Restrictions Weight Bearing Restrictions Per Provider Order: No      Mobility  Bed Mobility Overal bed mobility: Needs Assistance Bed Mobility: Supine to Sit, Sit to Supine     Supine to sit: Max assist, Total assist, +2 for physical assistance, +2 for safety/equipment, HOB elevated, Used rails Sit to supine: Max assist, Total assist, +2 for physical assistance, +2 for safety/equipment   General bed mobility comments: step by step multi modal cues    Transfers Overall transfer level: Needs assistance Equipment used: 2 person hand held assist Transfers: Sit to/from Stand Sit to Stand: Max assist, +2 physical assistance, +2 safety/equipment, From elevated surface           General transfer comment: Max multi-modal cues to initiate movement with near total assist to come to standing and to remain in standing with pt's knees blocked and standing in flexed trunk position    Ambulation/Gait               General Gait Details: Unable/unsafe to attempt  Stairs            Wheelchair Mobility     Tilt Bed    Modified Rankin (Stroke Patients Only)       Balance Overall balance assessment: Needs assistance Sitting-balance support: Feet supported Sitting balance-Leahy Scale: Fair Sitting balance - Comments: Assist to prevent posterior LOB initially but improved as the session progressed Postural control: Posterior lean Standing balance support: Bilateral upper extremity supported, Reliant on assistive device for balance Standing balance-Leahy Scale: Zero  Pertinent Vitals/Pain Pain Assessment Pain Assessment: PAINAD Breathing: normal Negative Vocalization: none Facial Expression: smiling or inexpressive Body Language: tense, distressed pacing, fidgeting Consolability: no need to console PAINAD Score: 1 Pain  Intervention(s): Monitored during session    Home Living Family/patient expects to be discharged to:: Other (Comment)                   Additional Comments: Pt from [facility]    Prior Function Prior Level of Function : Needs assist  Cognitive Assist : ADLs (cognitive)   ADLs (Cognitive): Step by step cues Physical Assist : ADLs (physical)   ADLs (physical): Feeding;Grooming;Bathing;Dressing;Toileting;IADLs Mobility Comments: per facillity pt Ind with amb without AD, no fall history ADLs Comments: per facility- extensive assist for ADL/IADL     Extremity/Trunk Assessment   Upper Extremity Assessment Upper Extremity Assessment: Generalized weakness;Difficult to assess due to impaired cognition    Lower Extremity Assessment Lower Extremity Assessment: Generalized weakness;Difficult to assess due to impaired cognition       Communication   Communication Communication: Impaired Factors Affecting Communication: Difficulty expressing self    Cognition Arousal: Alert     PT - Cognitive impairments: No family/caregiver present to determine baseline, History of cognitive impairments                         Following commands: Impaired Following commands impaired: Follows one step commands inconsistently     Cueing Cueing Techniques: Verbal cues, Gestural cues, Tactile cues, Visual cues     General Comments General comments (skin integrity, edema, etc.): vss throughout    Exercises     Assessment/Plan    PT Assessment Patient needs continued PT services  PT Problem List Decreased strength;Decreased balance;Decreased activity tolerance;Decreased mobility;Decreased cognition;Decreased safety awareness       PT Treatment Interventions DME instruction;Gait training;Functional mobility training;Therapeutic activities;Therapeutic exercise;Balance training;Patient/family education    PT Goals (Current goals can be found in the Care Plan section)   Acute Rehab PT Goals PT Goal Formulation: Patient unable to participate in goal setting Time For Goal Achievement: 02/26/24 Potential to Achieve Goals: Fair    Frequency Min 2X/week     Co-evaluation PT/OT/SLP Co-Evaluation/Treatment: Yes Reason for Co-Treatment: Complexity of the patient's impairments (multi-system involvement);For patient/therapist safety PT goals addressed during session: Mobility/safety with mobility;Balance OT goals addressed during session: ADL's and self-care       AM-PAC PT "6 Clicks" Mobility  Outcome Measure Help needed turning from your back to your side while in a flat bed without using bedrails?: Total Help needed moving from lying on your back to sitting on the side of a flat bed without using bedrails?: Total Help needed moving to and from a bed to a chair (including a wheelchair)?: Total Help needed standing up from a chair using your arms (e.g., wheelchair or bedside chair)?: Total Help needed to walk in hospital room?: Total Help needed climbing 3-5 steps with a railing? : Total 6 Click Score: 6    End of Session Equipment Utilized During Treatment: Gait belt Activity Tolerance: Patient tolerated treatment well Patient left: in bed;with bed alarm set;with nursing/sitter in room;Other (comment) (restraints and mitts doffed pre session/donned post session) Nurse Communication: Mobility status PT Visit Diagnosis: Unsteadiness on feet (R26.81);Difficulty in walking, not elsewhere classified (R26.2);Muscle weakness (generalized) (M62.81)    Time: 1610-9604 PT Time Calculation (min) (ACUTE ONLY): 25 min   Charges:   PT Evaluation $PT Eval Moderate Complexity: 1 Mod  PT General Charges $$ ACUTE PT VISIT: 1 Visit       D. Scott Sameeha Rockefeller PT, DPT 02/13/24, 11:34 AM

## 2024-02-13 NOTE — Progress Notes (Addendum)
 [facility] CLINIC CARDIOLOGY PROGRESS NOTE       Patient ID: Seth Thompson MRN: [id number] DOB/AGE: [birth year] 78 y.o.  Admit date: 02/11/2024 Referring Physician Dr. Alvenia Aus Primary Physician Donold Galla, [facility] Primary Cardiologist Kolwalksi (last seen 2022) Reason for Consultation bradycardia, syncope  HPI: Seth Thompson is a 78 y.o. male  with a past medical history of hypertension, sinus bradycardia, severe alzheimer dementia who presented to the ED on 02/11/2024 for syncope. Cardiology was consulted for further evaluation.   Interval History: -Patient with severe dementia, seen and examined this AM and pt laying comfortably in hospital. -Patients BP elevated and HR stable overnight. Overnight Tele showed no significant events. No significant bradycardia, no sustained HR < 50, no significant AVB or significant pauses.  -Electrolytes remain stable.   Review of systems complete and found to be negative unless listed above    Past Medical History:  Diagnosis Date   Benign essential HTN 02/03/2018   Dementia (HCC)    Diverticulitis of large intestine with abscess without bleeding 04/03/2018   Frequent PVCs 07/02/2016   Hyperlipidemia    Right inguinal hernia 04/01/2023    Past Surgical History:  Procedure Laterality Date   CHOLECYSTECTOMY     COLONOSCOPY WITH PROPOFOL  N/A 07/24/2023   Procedure: COLONOSCOPY WITH PROPOFOL ;  Surgeon: Luke Salaam, MD;  Location: [facility] ENDOSCOPY;  Service: Gastroenterology;  Laterality: N/A;   COLONOSCOPY WITH PROPOFOL  N/A 08/20/2023   Procedure: COLONOSCOPY WITH PROPOFOL ;  Surgeon: Luke Salaam, MD;  Location: [facility] ENDOSCOPY;  Service: Gastroenterology;  Laterality: N/A;    Medications Prior to Admission  Medication Sig Dispense Refill Last Dose/Taking   cyanocobalamin 1000 MCG tablet Take 1,000 mcg by mouth daily.   02/10/2024 at  5:36 AM   guaifenesin (ROBITUSSIN) 100 MG/5ML syrup Take 10 mLs by mouth every 4 (four) hours as needed for  cough or congestion.   Taking As Needed   melatonin 5 MG TABS Take 5 mg by mouth at bedtime.   02/10/2024 at  5:38 PM   Multiple Vitamin (MULTIVITAMIN) capsule Take 1 capsule by mouth daily.   02/10/2024 at  5:36 AM   polyethylene glycol (MIRALAX  / GLYCOLAX ) 17 g packet Take 17 g by mouth daily as needed for mild constipation.   Taking As Needed   senna (SENOKOT) 8.6 MG TABS tablet Take 1 tablet by mouth 3 (three) times a week. Mondays, Wednesday, and Friday   02/09/2024   sertraline  (ZOLOFT ) 50 MG tablet Take 50 mg by mouth at bedtime.   02/10/2024 at  5:38 PM   simethicone (MYLICON) 125 MG chewable tablet Chew 125 mg by mouth every 6 (six) hours as needed (gas/burping).   Taking As Needed   Social History   Socioeconomic History   Marital status: Divorced    Spouse name: Not on file   Number of children: Not on file   Years of education: Not on file   Highest education level: Not on file  Occupational History   Not on file  Tobacco Use   Smoking status: Never    Passive exposure: Never   Smokeless tobacco: Never  Vaping Use   Vaping status: Never Used  Substance and Sexual Activity   Alcohol use: Not Currently   Drug use: Not Currently   Sexual activity: Not on file  Other Topics Concern   Not on file  Social History Narrative   Not on file   Social Drivers of Health   Financial Resource Strain: Not on file  Food Insecurity: No Food Insecurity (02/11/2024)   Hunger Vital Sign    Worried About Running Out of Food in the Last Year: Never true    Ran Out of Food in the Last Year: Never true  Transportation Needs: No Transportation Needs (02/11/2024)   PRAPARE - Administrator, Civil Service (Medical): No    Lack of Transportation (Non-Medical): No  Physical Activity: Not on file  Stress: Not on file  Social Connections: Socially Isolated (02/11/2024)   Social Connection and Isolation Panel [NHANES]    Frequency of Communication with Friends and Family: Never    Frequency  of Social Gatherings with Friends and Family: Twice a week    Attends Religious Services: Never    Database administrator or Organizations: No    Attends Banker Meetings: Never    Marital Status: Divorced  Catering manager Violence: Patient Unable To Answer (02/11/2024)   Humiliation, Afraid, Rape, and Kick questionnaire    Fear of Current or Ex-Partner: Patient unable to answer    Emotionally Abused: Patient unable to answer    Physically Abused: Patient unable to answer    Sexually Abused: Patient unable to answer    History reviewed. No pertinent family history.   Vitals:   02/13/24 0855 02/13/24 0900 02/13/24 0905 02/13/24 0910  BP:    (!) 178/92  Pulse:    (!) 58  Resp: 15 14 16 13   Temp:      TempSrc:      SpO2:      Weight:        PHYSICAL EXAM General: Chronically ill appearing elderly male, well nourished, in no acute distress. HEENT: Normocephalic and atraumatic. Neck: No JVD.   Lungs: Normal respiratory effort on room air. Clear bilaterally to auscultation. No wheezes, crackles, rhonchi.  Heart: HRR, slower heart rate. Normal S1 and S2 without gallops or murmurs.  Abdomen: Non-distended appearing.  Msk: Normal strength and tone for age. Extremities: Warm and well perfused. No clubbing, cyanosis. No edema.   Labs: Basic Metabolic Panel: Recent Labs    02/11/24 0638 02/12/24 0523  NA 136 139  K 3.3* 3.9  CL 105 105  CO2 23 22  GLUCOSE 165* 119*  BUN 16 12  CREATININE 1.15 0.92  CALCIUM 8.6* 9.2  MG 1.7  --    Liver Function Tests: Recent Labs    02/11/24 0638  AST 22  ALT 14  ALKPHOS 44  BILITOT 0.6  PROT 7.0  ALBUMIN 3.8   No results for input(s): "LIPASE", "AMYLASE" in the last 72 hours. CBC: Recent Labs    02/11/24 0638 02/12/24 0523  WBC 6.5 5.6  NEUTROABS 5.0  --   HGB 15.3 16.1  HCT 45.3 45.9  MCV 91.5 89.5  PLT 122* 125*   Cardiac Enzymes: Recent Labs    02/11/24 0638 02/12/24 0116  TROPONINIHS 6 11    BNP: No results for input(s): "BNP" in the last 72 hours. D-Dimer: No results for input(s): "DDIMER" in the last 72 hours. Hemoglobin A1C: No results for input(s): "HGBA1C" in the last 72 hours. Fasting Lipid Panel: No results for input(s): "CHOL", "HDL", "LDLCALC", "TRIG", "CHOLHDL", "LDLDIRECT" in the last 72 hours. Thyroid  Function Tests: No results for input(s): "TSH", "T4TOTAL", "T3FREE", "THYROIDAB" in the last 72 hours.  Invalid input(s): "FREET3" Anemia Panel: No results for input(s): "VITAMINB12", "FOLATE", "FERRITIN", "TIBC", "IRON", "RETICCTPCT" in the last 72 hours.   Radiology: ECHOCARDIOGRAM COMPLETE Result Date: 02/12/2024  ECHOCARDIOGRAM REPORT   Patient Name:   Seth Thompson Date of Exam: 02/12/2024 Medical Rec #:  [id number]         Height:       70.0 in Accession #:    [id number]        Weight:       198.2 lb Date of Birth:  10-07-1946         BSA:          2.079 m Patient Age:    77 years          BP:           154/70 mmHg Patient Gender: M                 HR:           70 bpm. Exam Location:  [facility] Procedure: 2D Echo, Cardiac Doppler and Color Doppler (Both Spectral and Color            Flow Doppler were utilized during procedure). Indications:     Syncope R55  History:         Patient has no prior history of Echocardiogram examinations.                  Risk Factors:Hypertension and Dyslipidemia. Dementia.  Sonographer:     Broadus Canes Referring Phys:  [id number] Terika Pillard Diagnosing Phys: Joetta Mustache  Sonographer Comments: Technically challenging study due to limited acoustic windows, no parasternal window and no subcostal window. IMPRESSIONS  1. Left ventricular ejection fraction, by estimation, is 60 to 65%. The left ventricle has normal function. The left ventricle has no regional wall motion abnormalities. There is mild left ventricular hypertrophy. Left ventricular diastolic parameters were normal.  2. Right ventricular systolic function is normal. The right  ventricular size is normal.  3. The mitral valve is normal in structure. No evidence of mitral valve regurgitation.  4. The aortic valve is normal in structure. Aortic valve regurgitation is not visualized. FINDINGS  Left Ventricle: Left ventricular ejection fraction, by estimation, is 60 to 65%. The left ventricle has normal function. The left ventricle has no regional wall motion abnormalities. The left ventricular internal cavity size was normal in size. There is  mild left ventricular hypertrophy. Left ventricular diastolic parameters were normal. Right Ventricle: The right ventricular size is normal. No increase in right ventricular wall thickness. Right ventricular systolic function is normal. Left Atrium: Left atrial size was normal in size. Right Atrium: Right atrial size was normal in size. Pericardium: There is no evidence of pericardial effusion. Mitral Valve: The mitral valve is normal in structure. No evidence of mitral valve regurgitation. Tricuspid Valve: The tricuspid valve is normal in structure. Tricuspid valve regurgitation is trivial. Aortic Valve: The aortic valve is normal in structure. Aortic valve regurgitation is not visualized. Aortic valve mean gradient measures 2.0 mmHg. Aortic valve peak gradient measures 4.4 mmHg. Aortic valve area, by VTI measures 3.53 cm. Pulmonic Valve: The pulmonic valve was not well visualized. Pulmonic valve regurgitation is not visualized. Aorta: The aortic root is normal in size and structure. IAS/Shunts: The interatrial septum was not well visualized.  LEFT VENTRICLE PLAX 2D LVIDd:         3.50 cm   Diastology LVIDs:         2.50 cm   LV e' medial:    8.38 cm/s LV PW:         1.00 cm   LV E/e'  medial:  8.6 LV IVS:        1.10 cm   LV e' lateral:   11.10 cm/s LVOT diam:     2.20 cm   LV E/e' lateral: 6.5 LV SV:         73 LV SV Index:   35 LVOT Area:     3.80 cm  RIGHT VENTRICLE RV Basal diam:  3.00 cm RV Mid diam:    2.10 cm LEFT ATRIUM             Index         RIGHT ATRIUM           Index LA diam:        3.90 cm 1.88 cm/m   RA Area:     10.10 cm LA Vol (A2C):   36.4 ml 17.51 ml/m  RA Volume:   18.30 ml  8.80 ml/m LA Vol (A4C):   36.6 ml 17.60 ml/m LA Biplane Vol: 37.4 ml 17.99 ml/m  AORTIC VALVE AV Area (Vmax):    3.41 cm AV Area (Vmean):   3.65 cm AV Area (VTI):     3.53 cm AV Vmax:           105.00 cm/s AV Vmean:          72.200 cm/s AV VTI:            0.207 m AV Peak Grad:      4.4 mmHg AV Mean Grad:      2.0 mmHg LVOT Vmax:         94.20 cm/s LVOT Vmean:        69.400 cm/s LVOT VTI:          0.192 m LVOT/AV VTI ratio: 0.93  AORTA Ao Root diam: 3.50 cm MITRAL VALVE               TRICUSPID VALVE MV Area (PHT): 2.93 cm    TR Peak grad:   18.5 mmHg MV Decel Time: 259 msec    TR Vmax:        215.00 cm/s MV E velocity: 72.30 cm/s MV A velocity: 91.70 cm/s  SHUNTS MV E/A ratio:  0.79        Systemic VTI:  0.19 m                            Systemic Diam: 2.20 cm Joetta Mustache Electronically signed by Joetta Mustache Signature Date/Time: 02/12/2024/4:12:59 PM    Final    CT ABDOMEN PELVIS W CONTRAST Result Date: 02/11/2024 CLINICAL DATA:  Right inguinal hernia. Concern for bowel obstruction. EXAM: CT ABDOMEN AND PELVIS WITH CONTRAST TECHNIQUE: Multidetector CT imaging of the abdomen and pelvis was performed using the standard protocol following bolus administration of intravenous contrast. RADIATION DOSE REDUCTION: This exam was performed according to the departmental dose-optimization program which includes automated exposure control, adjustment of the mA and/or kV according to patient size and/or use of iterative reconstruction technique. CONTRAST:  OMNIPAQUE  IOHEXOL  300 MG/ML  SOLN COMPARISON:  CT abdomen pelvis dated 03/08/2023. FINDINGS: Lower chest: Bibasilar atelectasis/scarring. The visualized lung bases are otherwise clear. Coronary vascular calcification. No intra-abdominal free air or free fluid. Hepatobiliary: The liver is unremarkable. There is  mild dilatation, post cholecystectomy. No retained calcified stone noted in the central CBD. Pancreas: Unremarkable. No pancreatic ductal dilatation or surrounding inflammatory changes. Spleen: Normal in size without focal abnormality. Adrenals/Urinary Tract: The adrenal glands  unremarkable. There is a 2.5 cm right renal inferior pole cyst. There is a 4 mm nonobstructing left renal interpolar stone. There is no hydronephrosis on either side. There is symmetric enhancement and excretion of contrast by both kidneys. The visualized ureters and urinary bladder appear unremarkable. Stomach/Bowel: There is a large right inguinal hernia containing multiple loops of distal small bowel. There is no bowel obstruction. There is sigmoid diverticulosis. The appendix is normal. Vascular/Lymphatic: Mild aortoiliac atherosclerotic disease. The IVC is unremarkable. No portal venous gas. There is no adenopathy. Reproductive: The prostate and seminal vesicles are grossly unremarkable. No pelvic mass. Other: None Musculoskeletal: Osteopenia with degenerative changes of the spine. No acute osseous pathology. IMPRESSION: 1. Large right inguinal hernia containing multiple loops of distal small bowel. No bowel obstruction. 2. Sigmoid diverticulosis. Normal appendix. 3. A 4 mm nonobstructing left renal interpolar stone. No hydronephrosis. 4.  Aortic Atherosclerosis (ICD10-I70.0). Electronically Signed   By: Angus Bark M.D.   On: 02/11/2024 13:56   US  Venous Img Lower Unilateral Right Result Date: 02/11/2024 CLINICAL DATA:  Right lower extremity edema EXAM: RIGHT LOWER EXTREMITY VENOUS DOPPLER ULTRASOUND TECHNIQUE: Gray-scale sonography with compression, as well as color and duplex ultrasound, were performed to evaluate the deep venous system(s) from the level of the common femoral vein through the popliteal and proximal calf veins. COMPARISON:  None Available. FINDINGS: VENOUS Normal compressibility of the common femoral,  superficial femoral, and popliteal veins, as well as the visualized calf veins. Visualized portions of profunda femoral vein and great saphenous vein unremarkable. No filling defects to suggest DVT on grayscale or color Doppler imaging. Doppler waveforms show normal direction of venous flow, normal respiratory plasticity and response to augmentation. Limited views of the contralateral common femoral vein are unremarkable. OTHER Edema is present within the superficial soft tissues. Limitations: none IMPRESSION: 1. No evidence of right lower extremity DVT. Electronically Signed   By: Reagan Camera M.D.   On: 02/11/2024 08:43   CT Head Wo Contrast Result Date: 02/11/2024 CLINICAL DATA:  Minor head trauma EXAM: CT HEAD WITHOUT CONTRAST CT CERVICAL SPINE WITHOUT CONTRAST TECHNIQUE: Multidetector CT imaging of the head and cervical spine was performed following the standard protocol without intravenous contrast. Multiplanar CT image reconstructions of the cervical spine were also generated. RADIATION DOSE REDUCTION: This exam was performed according to the departmental dose-optimization program which includes automated exposure control, adjustment of the mA and/or kV according to patient size and/or use of iterative reconstruction technique. COMPARISON:  04/14/2023 FINDINGS: CT HEAD FINDINGS Brain: No evidence of acute infarction, hemorrhage, hydrocephalus, extra-axial collection or mass lesion/mass effect. Advanced and generalized cortical atrophy. Vascular: No hyperdense vessel or unexpected calcification. Skull: Normal. Negative for fracture or focal lesion. Sinuses/Orbits: No evidence of injury CT CERVICAL SPINE FINDINGS Alignment: Normal. Skull base and vertebrae: No acute fracture. No primary bone lesion or focal pathologic process. Soft tissues and spinal canal: No prevertebral fluid or swelling. No visible canal hematoma. Disc levels: Generalized degenerative disc space narrowing and endplate spurring. Upper  chest: No evidence of injury IMPRESSION: No evidence of acute intracranial or cervical spine injury. Advanced cortical atrophy. Electronically Signed   By: Ronnette Coke M.D.   On: 02/11/2024 07:19   CT Cervical Spine Wo Contrast Result Date: 02/11/2024 CLINICAL DATA:  Minor head trauma EXAM: CT HEAD WITHOUT CONTRAST CT CERVICAL SPINE WITHOUT CONTRAST TECHNIQUE: Multidetector CT imaging of the head and cervical spine was performed following the standard protocol without intravenous contrast. Multiplanar CT image reconstructions of  the cervical spine were also generated. RADIATION DOSE REDUCTION: This exam was performed according to the departmental dose-optimization program which includes automated exposure control, adjustment of the mA and/or kV according to patient size and/or use of iterative reconstruction technique. COMPARISON:  04/14/2023 FINDINGS: CT HEAD FINDINGS Brain: No evidence of acute infarction, hemorrhage, hydrocephalus, extra-axial collection or mass lesion/mass effect. Advanced and generalized cortical atrophy. Vascular: No hyperdense vessel or unexpected calcification. Skull: Normal. Negative for fracture or focal lesion. Sinuses/Orbits: No evidence of injury CT CERVICAL SPINE FINDINGS Alignment: Normal. Skull base and vertebrae: No acute fracture. No primary bone lesion or focal pathologic process. Soft tissues and spinal canal: No prevertebral fluid or swelling. No visible canal hematoma. Disc levels: Generalized degenerative disc space narrowing and endplate spurring. Upper chest: No evidence of injury IMPRESSION: No evidence of acute intracranial or cervical spine injury. Advanced cortical atrophy. Electronically Signed   By: Ronnette Coke M.D.   On: 02/11/2024 07:19    ECHO as above  TELEMETRY reviewed by me 02/13/2024: sinus bradycardia, rate 50s (no significant events overnight on tele)  EKG reviewed by me: sinus bradycardia with short sinus pause, rate 58 bpm   Data reviewed  by me 02/13/2024: last 24h vitals tele labs imaging I/O hospitalist progress note.  Principal Problem:   Bradycardia with less than 30 beats per minute Active Problems:   Incarcerated right inguinal hernia   Syncope   Alzheimer's disease (HCC)    ASSESSMENT AND PLAN:  Seth Thompson is a 78 y.o. male  with a past medical history of hypertension, sinus bradycardia, severe alzheimer dementia who presented to the ED on 02/11/2024 for syncope. Cardiology was consulted for further evaluation.   # Bradycardia # Syncope  # Alzheimer dementia  # Hypertension Patient with severe dementia reports to ED with bradycardia and a witnessed syncopal event. Trop neg x 1. EKG in ED and per tele patient sinus bradycardia with sinus pauses that are less than 3 seconds. Longest pause was 2.1 seconds on tele on 05/07. Per tele there has been no significant bradycardia, no sustained HR < 50, no significant AVB or significant pauses since admission. Echo this admission EF 60-65%, no RWMA, no significant valvular disease. HR stable and BP elevated this AM.   -Start amlodipine  5 mg daily for elevated BP. Can up-titrate if continued elevated BP.  -Avoid AVN blockers due to patients bradycardia. -No indication for PPM at this time as patient has no evidence of high grade AV block and no evidence of sinus pause longer than 3 seconds, especially with patients severe dementia. Most likely vasovagal syncope.  -Will not place holter due to patients baseline advanced dementia.   Cardiology will sign off. Please haiku with questions or re-engage if needed.   This patient's plan of care was discussed and created with Dr. Bob Burn and he is in agreement.  Signed: Creighton Doffing, PA-C  02/13/2024, 9:32 AM [facility]

## 2024-02-13 NOTE — Evaluation (Signed)
 Occupational Therapy Evaluation Patient Details Name: Seth Thompson MRN: [id number] DOB: [birth year] Today's Date: 02/13/2024   History of Present Illness   Pt is a 78 year old male admitted after LOC in shower, admitted with acute metabolic encephalopathy, syncope in the setting of bradycardia, hypokalemia, incarcerated right sided inguinal hernia. PMH significant for essential hypertension, dementia, frequent PVCs, and hyperlipidemia.     Clinical Impressions Chart reviewed to date, pt greeted in room, oriented to self only. Pt requires multi modal cues for one step direction following but is pleasant and agreeable. PTA per facility pt amb with no AD, requires extensive assist for ADL/IADL. Pt presents with deficits in strength, endurance, activity tolerance, balance, cognition, affecting safe and optimal ADL completion. PT requires MAX-TOTAL A for ADLs, MAX-TOTAL A +2 for bed mobility STS with MAX A +2, B knees blocked. Pt will benefit from acute OT to address functional deficits and to facilitate optimal ADL participation. Anticipate pt will have optimal change at improvement in ADL performance with familiar environment. Pt is left as received, all needs met. OT will follow.      If plan is discharge home, recommend the following:   Two people to help with walking and/or transfers;Two people to help with bathing/dressing/bathroom;Supervision due to cognitive status     Functional Status Assessment   Patient has had a recent decline in their functional status and demonstrates the ability to make significant improvements in function in a reasonable and predictable amount of time.     Equipment Recommendations   [employer];Wheelchair (measurements OT)     Recommendations for Other Services         Precautions/Restrictions   Precautions Precautions: Fall Recall of Precautions/Restrictions: Impaired Restrictions Weight Bearing Restrictions Per Provider Order: No      Mobility Bed Mobility Overal bed mobility: Needs Assistance Bed Mobility: Supine to Sit, Sit to Supine     Supine to sit: Max assist, Total assist, +2 for physical assistance, +2 for safety/equipment, HOB elevated, Used rails Sit to supine: Max assist, Total assist, +2 for physical assistance, +2 for safety/equipment   General bed mobility comments: step by step multi modal cues    Transfers Overall transfer level: Needs assistance Equipment used: 2 person hand held assist Transfers: Sit to/from Stand Sit to Stand: Max assist, +2 physical assistance, +2 safety/equipment, From elevated surface           General transfer comment: step by step multi modal cues for positioning/technique, two attempts, B knees blocked      Balance Overall balance assessment: Needs assistance Sitting-balance support: Feet supported Sitting balance-Leahy Scale: Fair   Postural control: Posterior lean Standing balance support: Bilateral upper extremity supported, During functional activity, Reliant on assistive device for balance Standing balance-Leahy Scale: Zero                             ADL either performed or assessed with clinical judgement   ADL Overall ADL's : Needs assistance/impaired     Grooming: Maximal assistance;Total assistance           Upper Body Dressing : Maximal assistance;Sitting;Cueing for sequencing Upper Body Dressing Details (indicate cue type and reason): doff/donn gown Lower Body Dressing: Maximal assistance;Total assistance;Cueing for sequencing       Toileting- Clothing Manipulation and Hygiene: Maximal assistance;Total assistance;Bed level         General ADL Comments: step by step multi modal cues for task initiation and  participation     Vision   Additional Comments: difficult to assess due to cognition will continue to assess     Perception         Praxis         Pertinent Vitals/Pain Pain Assessment Pain Assessment:  PAINAD Breathing: normal Negative Vocalization: none Facial Expression: smiling or inexpressive Body Language: tense, distressed pacing, fidgeting Consolability: no need to console PAINAD Score: 1 Pain Intervention(s): Monitored during session     Extremity/Trunk Assessment Upper Extremity Assessment Upper Extremity Assessment: Generalized weakness;Difficult to assess due to impaired cognition   Lower Extremity Assessment Lower Extremity Assessment: Generalized weakness;Difficult to assess due to impaired cognition       Communication Communication Communication: Impaired Factors Affecting Communication: Difficulty expressing self   Cognition Arousal: Alert   Cognition: History of cognitive impairments, Cognition impaired   Orientation impairments: Place, Time, Situation Awareness: Intellectual awareness impaired, Online awareness impaired Memory impairment (select all impairments): Short-term memory, Working [employer], Non-declarative long-term memory, Geneticist, molecular long-term memory Attention impairment (select first level of impairment): Focused attention Executive functioning impairment (select all impairments): Initiation, Organization, Sequencing, Reasoning, Problem solving OT - Cognition Comments: step by step multi modal cues for task initiation; pt is pleasant but only states name                 Following commands: Impaired Following commands impaired: Follows one step commands inconsistently     Cueing  General Comments   Cueing Techniques: Verbal cues;Gestural cues;Tactile cues;Visual cues  vss throughout   Exercises Other Exercises Other Exercises: restraints and mitts doffed pre session/donned post session per protocol, discussed delerium precautions with nurse, sitter;   Shoulder Instructions      Home Living Family/patient expects to be discharged to:: Other (Comment)                                 Additional Comments: Pt from  [facility]      Prior Functioning/Environment Prior Level of Function : Needs assist  Cognitive Assist : ADLs (cognitive)   ADLs (Cognitive): Step by step cues Physical Assist : ADLs (physical)   ADLs (physical): Feeding;Grooming;Bathing;Dressing;Toileting;IADLs Mobility Comments: per facillity pt Ind with amb without AD, no fall history ADLs Comments: per facility- extensive assist for ADL/IADL    OT Problem List: Decreased activity tolerance;Decreased knowledge of use of DME or AE;Decreased safety awareness;Impaired balance (sitting and/or standing);Decreased cognition   OT Treatment/Interventions: Self-care/ADL training;DME and/or AE instruction;Therapeutic activities;Balance training;Therapeutic exercise;Visual/perceptual remediation/compensation;Energy conservation;Patient/family education      OT Goals(Current goals can be found in the care plan section)   Acute Rehab OT Goals OT Goal Formulation: Patient unable to participate in goal setting Time For Goal Achievement: 02/27/24 Potential to Achieve Goals: Fair ADL Goals Pt Will Perform Grooming: with supervision;sitting Pt Will Perform Lower Body Dressing: sitting/lateral leans;sit to/from stand;with mod assist Pt Will Transfer to Toilet: with mod assist;stand pivot transfer Pt Will Perform Toileting - Clothing Manipulation and hygiene: with mod assist;sit to/from stand;sitting/lateral leans   OT Frequency:  Min 1X/week    Co-evaluation PT/OT/SLP Co-Evaluation/Treatment: Yes Reason for Co-Treatment: Complexity of the patient's impairments (multi-system involvement);For patient/therapist safety PT goals addressed during session: Mobility/safety with mobility;Balance OT goals addressed during session: ADL's and self-care      AM-PAC OT "6 Clicks" Daily Activity     Outcome Measure Help from another person eating meals?: A Lot Help from another person taking care  of personal grooming?: A Lot Help from another  person toileting, which includes using toliet, bedpan, or urinal?: Total Help from another person bathing (including washing, rinsing, drying)?: Total Help from another person to put on and taking off regular upper body clothing?: A Lot Help from another person to put on and taking off regular lower body clothing?: Total 6 Click Score: 9   End of Session Equipment Utilized During Treatment: Gait belt Nurse Communication: Mobility status  Activity Tolerance: Patient tolerated treatment well Patient left: in bed;with call bell/phone within reach;with bed alarm set;with nursing/sitter in room  OT Visit Diagnosis: Other abnormalities of gait and mobility (R26.89)                Time: 3474-2595 OT Time Calculation (min): 22 min Charges:  OT General Charges $OT Visit: 1 Visit OT Evaluation $OT Eval Moderate Complexity: 1 Mod Gerre Kraft, OTD OTR/L  02/13/24, 11:24 AM

## 2024-02-14 DIAGNOSIS — R001 Bradycardia, unspecified: Secondary | ICD-10-CM | POA: Diagnosis not present

## 2024-02-14 LAB — VITAMIN B12: Vitamin B-12: 759 pg/mL (ref 180–914)

## 2024-02-14 LAB — MRSA NEXT GEN BY PCR, NASAL: MRSA by PCR Next Gen: NOT DETECTED

## 2024-02-14 NOTE — Progress Notes (Signed)
 Progress Note   Patient: Seth Thompson WUJ:[id number] DOB: 08/21/1946 DOA: 02/11/2024     3 DOS: the patient was seen and examined on 02/14/2024    Brief hospital course:  Seth Thompson is a 78 y.o. male with medical history significant of essential hypertension, dementia, frequent PVCs, hyperlipidemia who was brought in from a facility after the patient lost consciousness while sitting on the shower chair at the facility.  At the time patient was being seen he was pleasantly confused and so unable to contribute to history.  History was obtained from chart review as well as discussion with the ED physician Upon arrival to the emergency room patient was found to be bradycardic in the 50s and had a drop of his pulse into the 20s.  Hospitalist service was therefore contacted to admit patient for further management.  ED physician also spoke with cardiologist who will be seeing patient in consultation.  Patient also found to have incidental right inguinal hernia that was irreducible and as such general surgeon has been consulted.   Assessment and Plan:   Acute metabolic encephalopathy. CT scan of the brain did not show any acute intracranial pathology Patient still confused No evidence of infection detected Continue delirium precaution Monitor neurochecks closely Patient currently requiring one-to-one sitter Follow-up TSH, B12 as well as thiamine levels and folic acid  Syncope in the setting of bradycardia-improved Upon arrival to the emergency room patient was found to be bradycardic in the 50s and had a drop of his pulse into the 20s Cardiologist on board and case discussed Echo showed EF 60 to 65% with normal diastolic parameters I have discussed the case with cardiology team At this point no permanent pacemaker placement recommended     Hypokalemia-continue repletion and monitoring   Incarcerated right sided inguinal hernia I have discussed the case with general  surgery Continue as needed pain medication   Essential hypertension, Avoid AV nodal blocking agents Other antihypertensives on hold as recommended by cardiology Patient placed on as needed hydralazine  for systolic blood pressure greater than 170   Severe dementia with behavioral disturbances Continue delirium precaution Continue one-to-one sitter   Hyperlipidemia-does not appear to be on statin   DVT prophylaxis-continue Lovenox     Advance Care Planning:   Code Status: Prior full code   Consults: Cardiology, general surgery   Family Communication: None present at bedside     Subjective:  Patient seen and examined at bedside this morning Patient is still confused Telemetry noted to have some possible sinusitis cardiologist have been reconsulted Palliative consultation recommended and placed   Physical Exam:  General: He is not in acute distress.  Pleasantly confused    Appearance: He is not ill-appearing.  Cardiovascular:     Rate and Rhythm: Normal rate and regular rhythm.     Heart sounds: Normal heart sounds.  Pulmonary:     Effort: Pulmonary effort is normal. No accessory muscle usage.  Neurological:     General: No focal deficit present.  Confused but moving all extremities equally Psychiatric:    Confused Abdomen: Full nondistended, tenderness noted to the right inguinal area, right-sided inguinal hernia noted   Data Reviewed:   CT scan of the brain reviewed that did not show any acute intracranial pathology    Vitals:   02/13/24 1535 02/13/24 2003 02/14/24 0349 02/14/24 0724  BP: (!) 167/68 (!) 143/78 (!) 152/86 (!) 178/91  Pulse: (!) 48 81 (!) 49 (!) 50  Resp: 20 16 20  16  Temp:  98 F (36.7 C) 98.3 F (36.8 C) 98.7 F (37.1 C)  TempSrc:  Oral Oral   SpO2: 98% 100% 99% 93%  Weight:          Latest Ref Rng & Units 02/12/2024    5:23 AM 02/11/2024    6:38 AM 04/14/2023    6:37 PM  CBC  WBC 4.0 - 10.5 K/uL 5.6  6.5  10.4   Hemoglobin 13.0 - 17.0 g/dL  30.8  65.7  84.6   Hematocrit 39.0 - 52.0 % 45.9  45.3  50.4   Platelets 150 - 400 K/uL 125  122  160        Latest Ref Rng & Units 02/12/2024    5:23 AM 02/11/2024    6:38 AM 04/14/2023    6:37 PM  BMP  Glucose 70 - 99 mg/dL 962  952  841   BUN 8 - 23 mg/dL 12  16  21    Creatinine 0.61 - 1.24 mg/dL 3.24  4.01  0.27   Sodium 135 - 145 mmol/L 139  136  139   Potassium 3.5 - 5.1 mmol/L 3.9  3.3  3.8   Chloride 98 - 111 mmol/L 105  105  106   CO2 22 - 32 mmol/L 22  23  20    Calcium 8.9 - 10.3 mg/dL 9.2  8.6  9.8      Author: Ezzard Holms, MD 02/14/2024 3:24 PM  For on call review [email or web address].

## 2024-02-14 NOTE — NC FL2 (Signed)
 [place]  MEDICAID FL2 LEVEL OF CARE FORM     IDENTIFICATION  Patient Name: Seth Thompson Birthdate: 11/26/1945 Sex: male Admission Date (Current Location): 02/11/2024  [facility] and [place] Number:  Chiropodist and Address:         Provider Number: [id number]  Attending Physician Name and Address:  Ezzard Holms, MD  Relative Name and Phone Number:  [facility] Donnalee Gab  [phone] (Work Phone)    Current Level of Care: Hospital Recommended Level of Care: Skilled Nursing Facility Prior Approval Number:    Date Approved/Denied:   PASRR Number: [id number]  Discharge Plan:      Current Diagnoses: Patient Active Problem List   Diagnosis Date Noted   Bradycardia with less than 30 beats per minute 02/11/2024   Incarcerated right inguinal hernia 02/11/2024   Syncope 02/11/2024   Alzheimer's disease (HCC) 02/11/2024   Right inguinal hernia 04/01/2023   Diarrhea 02/02/2019   Benign essential HTN 02/03/2018   Mild dementia (HCC) 11/25/2017   Mild cognitive impairment 05/27/2017   Loss of memory 02/04/2017   Frequent PVCs 07/02/2016   Palpitations 06/27/2016   Bradycardia 02/22/2016   Mixed hyperlipidemia 05/10/2014    Orientation RESPIRATION BLADDER Height & Weight     Self  Normal Incontinent Weight: 198 lb 3.2 oz (89.9 kg) Height:     BEHAVIORAL SYMPTOMS/MOOD NEUROLOGICAL BOWEL NUTRITION STATUS      Incontinent Diet (heart)  AMBULATORY STATUS COMMUNICATION OF NEEDS Skin   Extensive Assist Verbally Skin abrasions, Bruising                       Personal Care Assistance Level of Assistance  Bathing, Feeding, Dressing Bathing Assistance: Maximum assistance Feeding assistance: Maximum assistance Dressing Assistance: Maximum assistance     Functional Limitations Info             SPECIAL CARE FACTORS FREQUENCY  PT (By licensed PT), OT (By licensed OT)     PT Frequency: 5 times per week OT Frequency: 5 times per  week            Contractures      Additional Factors Info  Code Status, Allergies Code Status Info: full Allergies Info: Codeine           Current Medications (02/14/2024):  This is the current hospital active medication list Current Facility-Administered Medications  Medication Dose Route Frequency Provider Last Rate Last Admin   amLODipine  (NORVASC ) tablet 5 mg  5 mg Oral Daily Decoste, Gabriella, PA-C   5 mg at 02/14/24 1035   enoxaparin  (LOVENOX ) injection 40 mg  40 mg Subcutaneous Q24H Alvenia Aus T, MD   40 mg at 02/13/24 2239   hydrALAZINE  (APRESOLINE ) injection 10 mg  10 mg Intravenous Q6H PRN Alvenia Aus T, MD   10 mg at 02/12/24 0406   melatonin tablet 5 mg  5 mg Oral QHS Alvenia Aus T, MD   5 mg at 02/13/24 2239   polyethylene glycol (MIRALAX  / GLYCOLAX ) packet 17 g  17 g Oral Daily PRN Ezzard Holms, MD       senna Alonna Art) tablet 8.6 mg  1 tablet Oral Once per day on Monday Wednesday Friday Alvenia Aus T, MD       sertraline  (ZOLOFT ) tablet 50 mg  50 mg Oral QHS Djan, Prince T, MD   50 mg at 02/13/24 2239   ziprasidone  (GEODON ) injection 20 mg  20 mg Intramuscular Once Djan, Prince T, MD  Discharge Medications: Please see discharge summary for a list of discharge medications.  Relevant Imaging Results:  Relevant Lab Results:   Additional Information SS #: [id number]  Nanna Ertle E Deveney Bayon, LCSW

## 2024-02-14 NOTE — Progress Notes (Signed)
 [facility] Cardiology  CARDIOLOGY PROGRESS NOTE  Patient ID: Seth Thompson MRN: [id number] DOB/AGE: 77-06-1946 77 y.o.  Admit date: 02/11/2024 Referring Physician Dr. Mariella Shore Reason for Consultation Bradycardia  HPI: Cannot obtain any history secondary to dementia.  Currently with mittens  Review of systems complete and found to be negative unless listed above     Past Medical History:  Diagnosis Date   Benign essential HTN 02/03/2018   Dementia (HCC)    Diverticulitis of large intestine with abscess without bleeding 04/03/2018   Frequent PVCs 07/02/2016   Hyperlipidemia    Right inguinal hernia 04/01/2023    Past Surgical History:  Procedure Laterality Date   CHOLECYSTECTOMY     COLONOSCOPY WITH PROPOFOL  N/A 07/24/2023   Procedure: COLONOSCOPY WITH PROPOFOL ;  Surgeon: Luke Salaam, MD;  Location: [facility] ENDOSCOPY;  Service: Gastroenterology;  Laterality: N/A;   COLONOSCOPY WITH PROPOFOL  N/A 08/20/2023   Procedure: COLONOSCOPY WITH PROPOFOL ;  Surgeon: Luke Salaam, MD;  Location: [facility] ENDOSCOPY;  Service: Gastroenterology;  Laterality: N/A;    Medications Prior to Admission  Medication Sig Dispense Refill Last Dose/Taking   cyanocobalamin 1000 MCG tablet Take 1,000 mcg by mouth daily.   02/10/2024 at  5:36 AM   guaifenesin (ROBITUSSIN) 100 MG/5ML syrup Take 10 mLs by mouth every 4 (four) hours as needed for cough or congestion.   Taking As Needed   melatonin 5 MG TABS Take 5 mg by mouth at bedtime.   02/10/2024 at  5:38 PM   Multiple Vitamin (MULTIVITAMIN) capsule Take 1 capsule by mouth daily.   02/10/2024 at  5:36 AM   polyethylene glycol (MIRALAX  / GLYCOLAX ) 17 g packet Take 17 g by mouth daily as needed for mild constipation.   Taking As Needed   senna (SENOKOT) 8.6 MG TABS tablet Take 1 tablet by mouth 3 (three) times a week. Mondays, Wednesday, and Friday   02/09/2024   sertraline  (ZOLOFT ) 50 MG tablet Take 50 mg by mouth at bedtime.   02/10/2024 at  5:38 PM   simethicone (MYLICON) 125 MG  chewable tablet Chew 125 mg by mouth every 6 (six) hours as needed (gas/burping).   Taking As Needed   Social History   Socioeconomic History   Marital status: Divorced    Spouse name: Not on file   Number of children: Not on file   Years of education: Not on file   Highest education level: Not on file  Occupational History   Not on file  Tobacco Use   Smoking status: Never    Passive exposure: Never   Smokeless tobacco: Never  Vaping Use   Vaping status: Never Used  Substance and Sexual Activity   Alcohol use: Not Currently   Drug use: Not Currently   Sexual activity: Not on file  Other Topics Concern   Not on file  Social History Narrative   Not on file   Social Drivers of Health   Financial Resource Strain: Not on file  Food Insecurity: No Food Insecurity (02/11/2024)   Hunger Vital Sign    Worried About Running Out of Food in the Last Year: Never true    Ran Out of Food in the Last Year: Never true  Transportation Needs: No Transportation Needs (02/11/2024)   PRAPARE - Administrator, Civil Service (Medical): No    Lack of Transportation (Non-Medical): No  Physical Activity: Not on file  Stress: Not on file  Social Connections: Socially Isolated (02/11/2024)   Social Connection and Isolation Panel [  NHANES]    Frequency of Communication with Friends and Family: Never    Frequency of Social Gatherings with Friends and Family: Twice a week    Attends Religious Services: Never    Database administrator or Organizations: No    Attends Banker Meetings: Never    Marital Status: Divorced  Catering manager Violence: Patient Unable To Answer (02/11/2024)   Humiliation, Afraid, Rape, and Kick questionnaire    Fear of Current or Ex-Partner: Patient unable to answer    Emotionally Abused: Patient unable to answer    Physically Abused: Patient unable to answer    Sexually Abused: Patient unable to answer    History reviewed. No pertinent family history.     Review of systems complete and found to be negative unless listed above      PHYSICAL EXAM  Alert but not oriented to time place or person No murmur No pedal edema.  Labs:   Lab Results  Component Value Date   WBC 5.6 02/12/2024   HGB 16.1 02/12/2024   HCT 45.9 02/12/2024   MCV 89.5 02/12/2024   PLT 125 (L) 02/12/2024    Recent Labs  Lab 02/11/24 0638 02/12/24 0523  NA 136 139  K 3.3* 3.9  CL 105 105  CO2 23 22  BUN 16 12  CREATININE 1.15 0.92  CALCIUM 8.6* 9.2  PROT 7.0  --   BILITOT 0.6  --   ALKPHOS 44  --   ALT 14  --   AST 22  --   GLUCOSE 165* 119*   No results found for: "CKTOTAL", "CKMB", "CKMBINDEX", "TROPONINI" No results found for: "CHOL" No results found for: "HDL" No results found for: "LDLCALC" No results found for: "TRIG" No results found for: "CHOLHDL" No results found for: "LDLDIRECT"    Radiology: ECHOCARDIOGRAM COMPLETE Result Date: 02/12/2024    ECHOCARDIOGRAM REPORT   Patient Name:   TYREE TALATI Date of Exam: 02/12/2024 Medical Rec #:  [id number]         Height:       70.0 in Accession #:    [id number]        Weight:       198.2 lb Date of Birth:  08-15-1946         BSA:          2.079 m Patient Age:    77 years          BP:           154/70 mmHg Patient Gender: M                 HR:           70 bpm. Exam Location:  [facility] Procedure: 2D Echo, Cardiac Doppler and Color Doppler (Both Spectral and Color            Flow Doppler were utilized during procedure). Indications:     Syncope R55  History:         Patient has no prior history of Echocardiogram examinations.                  Risk Factors:Hypertension and Dyslipidemia. Dementia.  Sonographer:     Broadus Canes Referring Phys:  [id number] GABRIELLA DECOSTE Diagnosing Phys: Joetta Mustache  Sonographer Comments: Technically challenging study due to limited acoustic windows, no parasternal window and no subcostal window. IMPRESSIONS  1. Left ventricular ejection fraction, by estimation, is 60 to  65%. The left ventricle  has normal function. The left ventricle has no regional wall motion abnormalities. There is mild left ventricular hypertrophy. Left ventricular diastolic parameters were normal.  2. Right ventricular systolic function is normal. The right ventricular size is normal.  3. The mitral valve is normal in structure. No evidence of mitral valve regurgitation.  4. The aortic valve is normal in structure. Aortic valve regurgitation is not visualized. FINDINGS  Left Ventricle: Left ventricular ejection fraction, by estimation, is 60 to 65%. The left ventricle has normal function. The left ventricle has no regional wall motion abnormalities. The left ventricular internal cavity size was normal in size. There is  mild left ventricular hypertrophy. Left ventricular diastolic parameters were normal. Right Ventricle: The right ventricular size is normal. No increase in right ventricular wall thickness. Right ventricular systolic function is normal. Left Atrium: Left atrial size was normal in size. Right Atrium: Right atrial size was normal in size. Pericardium: There is no evidence of pericardial effusion. Mitral Valve: The mitral valve is normal in structure. No evidence of mitral valve regurgitation. Tricuspid Valve: The tricuspid valve is normal in structure. Tricuspid valve regurgitation is trivial. Aortic Valve: The aortic valve is normal in structure. Aortic valve regurgitation is not visualized. Aortic valve mean gradient measures 2.0 mmHg. Aortic valve peak gradient measures 4.4 mmHg. Aortic valve area, by VTI measures 3.53 cm. Pulmonic Valve: The pulmonic valve was not well visualized. Pulmonic valve regurgitation is not visualized. Aorta: The aortic root is normal in size and structure. IAS/Shunts: The interatrial septum was not well visualized.  LEFT VENTRICLE PLAX 2D LVIDd:         3.50 cm   Diastology LVIDs:         2.50 cm   LV e' medial:    8.38 cm/s LV PW:         1.00 cm   LV E/e' medial:   8.6 LV IVS:        1.10 cm   LV e' lateral:   11.10 cm/s LVOT diam:     2.20 cm   LV E/e' lateral: 6.5 LV SV:         73 LV SV Index:   35 LVOT Area:     3.80 cm  RIGHT VENTRICLE RV Basal diam:  3.00 cm RV Mid diam:    2.10 cm LEFT ATRIUM             Index        RIGHT ATRIUM           Index LA diam:        3.90 cm 1.88 cm/m   RA Area:     10.10 cm LA Vol (A2C):   36.4 ml 17.51 ml/m  RA Volume:   18.30 ml  8.80 ml/m LA Vol (A4C):   36.6 ml 17.60 ml/m LA Biplane Vol: 37.4 ml 17.99 ml/m  AORTIC VALVE AV Area (Vmax):    3.41 cm AV Area (Vmean):   3.65 cm AV Area (VTI):     3.53 cm AV Vmax:           105.00 cm/s AV Vmean:          72.200 cm/s AV VTI:            0.207 m AV Peak Grad:      4.4 mmHg AV Mean Grad:      2.0 mmHg LVOT Vmax:         94.20 cm/s LVOT Vmean:  69.400 cm/s LVOT VTI:          0.192 m LVOT/AV VTI ratio: 0.93  AORTA Ao Root diam: 3.50 cm MITRAL VALVE               TRICUSPID VALVE MV Area (PHT): 2.93 cm    TR Peak grad:   18.5 mmHg MV Decel Time: 259 msec    TR Vmax:        215.00 cm/s MV E velocity: 72.30 cm/s MV A velocity: 91.70 cm/s  SHUNTS MV E/A ratio:  0.79        Systemic VTI:  0.19 m                            Systemic Diam: 2.20 cm Joetta Mustache Electronically signed by Joetta Mustache Signature Date/Time: 02/12/2024/4:12:59 PM    Final    CT ABDOMEN PELVIS W CONTRAST Result Date: 02/11/2024 CLINICAL DATA:  Right inguinal hernia. Concern for bowel obstruction. EXAM: CT ABDOMEN AND PELVIS WITH CONTRAST TECHNIQUE: Multidetector CT imaging of the abdomen and pelvis was performed using the standard protocol following bolus administration of intravenous contrast. RADIATION DOSE REDUCTION: This exam was performed according to the departmental dose-optimization program which includes automated exposure control, adjustment of the mA and/or kV according to patient size and/or use of iterative reconstruction technique. CONTRAST:  OMNIPAQUE  IOHEXOL  300 MG/ML  SOLN COMPARISON:  CT  abdomen pelvis dated 03/08/2023. FINDINGS: Lower chest: Bibasilar atelectasis/scarring. The visualized lung bases are otherwise clear. Coronary vascular calcification. No intra-abdominal free air or free fluid. Hepatobiliary: The liver is unremarkable. There is mild dilatation, post cholecystectomy. No retained calcified stone noted in the central CBD. Pancreas: Unremarkable. No pancreatic ductal dilatation or surrounding inflammatory changes. Spleen: Normal in size without focal abnormality. Adrenals/Urinary Tract: The adrenal glands unremarkable. There is a 2.5 cm right renal inferior pole cyst. There is a 4 mm nonobstructing left renal interpolar stone. There is no hydronephrosis on either side. There is symmetric enhancement and excretion of contrast by both kidneys. The visualized ureters and urinary bladder appear unremarkable. Stomach/Bowel: There is a large right inguinal hernia containing multiple loops of distal small bowel. There is no bowel obstruction. There is sigmoid diverticulosis. The appendix is normal. Vascular/Lymphatic: Mild aortoiliac atherosclerotic disease. The IVC is unremarkable. No portal venous gas. There is no adenopathy. Reproductive: The prostate and seminal vesicles are grossly unremarkable. No pelvic mass. Other: None Musculoskeletal: Osteopenia with degenerative changes of the spine. No acute osseous pathology. IMPRESSION: 1. Large right inguinal hernia containing multiple loops of distal small bowel. No bowel obstruction. 2. Sigmoid diverticulosis. Normal appendix. 3. A 4 mm nonobstructing left renal interpolar stone. No hydronephrosis. 4.  Aortic Atherosclerosis (ICD10-I70.0). Electronically Signed   By: Angus Bark M.D.   On: 02/11/2024 13:56   US  Venous Img Lower Unilateral Right Result Date: 02/11/2024 CLINICAL DATA:  Right lower extremity edema EXAM: RIGHT LOWER EXTREMITY VENOUS DOPPLER ULTRASOUND TECHNIQUE: Gray-scale sonography with compression, as well as color and  duplex ultrasound, were performed to evaluate the deep venous system(s) from the level of the common femoral vein through the popliteal and proximal calf veins. COMPARISON:  None Available. FINDINGS: VENOUS Normal compressibility of the common femoral, superficial femoral, and popliteal veins, as well as the visualized calf veins. Visualized portions of profunda femoral vein and great saphenous vein unremarkable. No filling defects to suggest DVT on grayscale or color Doppler imaging. Doppler waveforms show normal direction of  venous flow, normal respiratory plasticity and response to augmentation. Limited views of the contralateral common femoral vein are unremarkable. OTHER Edema is present within the superficial soft tissues. Limitations: none IMPRESSION: 1. No evidence of right lower extremity DVT. Electronically Signed   By: Reagan Camera M.D.   On: 02/11/2024 08:43   CT Head Wo Contrast Result Date: 02/11/2024 CLINICAL DATA:  Minor head trauma EXAM: CT HEAD WITHOUT CONTRAST CT CERVICAL SPINE WITHOUT CONTRAST TECHNIQUE: Multidetector CT imaging of the head and cervical spine was performed following the standard protocol without intravenous contrast. Multiplanar CT image reconstructions of the cervical spine were also generated. RADIATION DOSE REDUCTION: This exam was performed according to the departmental dose-optimization program which includes automated exposure control, adjustment of the mA and/or kV according to patient size and/or use of iterative reconstruction technique. COMPARISON:  04/14/2023 FINDINGS: CT HEAD FINDINGS Brain: No evidence of acute infarction, hemorrhage, hydrocephalus, extra-axial collection or mass lesion/mass effect. Advanced and generalized cortical atrophy. Vascular: No hyperdense vessel or unexpected calcification. Skull: Normal. Negative for fracture or focal lesion. Sinuses/Orbits: No evidence of injury CT CERVICAL SPINE FINDINGS Alignment: Normal. Skull base and vertebrae: No  acute fracture. No primary bone lesion or focal pathologic process. Soft tissues and spinal canal: No prevertebral fluid or swelling. No visible canal hematoma. Disc levels: Generalized degenerative disc space narrowing and endplate spurring. Upper chest: No evidence of injury IMPRESSION: No evidence of acute intracranial or cervical spine injury. Advanced cortical atrophy. Electronically Signed   By: Ronnette Coke M.D.   On: 02/11/2024 07:19   CT Cervical Spine Wo Contrast Result Date: 02/11/2024 CLINICAL DATA:  Minor head trauma EXAM: CT HEAD WITHOUT CONTRAST CT CERVICAL SPINE WITHOUT CONTRAST TECHNIQUE: Multidetector CT imaging of the head and cervical spine was performed following the standard protocol without intravenous contrast. Multiplanar CT image reconstructions of the cervical spine were also generated. RADIATION DOSE REDUCTION: This exam was performed according to the departmental dose-optimization program which includes automated exposure control, adjustment of the mA and/or kV according to patient size and/or use of iterative reconstruction technique. COMPARISON:  04/14/2023 FINDINGS: CT HEAD FINDINGS Brain: No evidence of acute infarction, hemorrhage, hydrocephalus, extra-axial collection or mass lesion/mass effect. Advanced and generalized cortical atrophy. Vascular: No hyperdense vessel or unexpected calcification. Skull: Normal. Negative for fracture or focal lesion. Sinuses/Orbits: No evidence of injury CT CERVICAL SPINE FINDINGS Alignment: Normal. Skull base and vertebrae: No acute fracture. No primary bone lesion or focal pathologic process. Soft tissues and spinal canal: No prevertebral fluid or swelling. No visible canal hematoma. Disc levels: Generalized degenerative disc space narrowing and endplate spurring. Upper chest: No evidence of injury IMPRESSION: No evidence of acute intracranial or cervical spine injury. Advanced cortical atrophy. Electronically Signed   By: Ronnette Coke  M.D.   On: 02/11/2024 07:19    ASSESSMENT AND PLAN:  Bradycardia Syncope We are dementia Hypertension  Patient was brought in following syncopal episode, no history can be obtained with advanced dementia. He has known history of sinus bradycardia.  Monitor reviewed, sinus rhythm with intermittent sinus bradycardia and short sinus pause up to 2.1 seconds.  Intermittent junctional and ventricular escape beats noted with rate in low 40s/high 30s, although not sustaining for long duration. Certainly patient has sinus node dysfunction but unclear if this has contributed to his syncope.  Syncopal episode happened while sitting in shower, can be vasovagal as well. With severe dementia with behavioral disturbances, not a good candidate for conventional pacemaker.  Micra pacemaker can  be considered if needed.  No family member is available.  Recommend palliative consultation for goals of care discussion. Avoid rate control medications  Signed: Janette Medley MD 02/14/2024, 2:55 PM

## 2024-02-14 NOTE — TOC Initial Note (Signed)
 Transition of Care [facility]) - Initial/Assessment Note    Patient Details  Name: Seth Thompson MRN: [id number] Date of Birth: Jun 20, 1946  Transition of Care [facility]) CM/SW Contact:    Bonna Steury E Ridley Schewe, LCSW Phone Number: 02/14/2024, 11:09 AM  Clinical Narrative:                 Patient is from [facility]. Left a VM for DSS Legal Guardian. PT recommends SNF for STR - work up started.  Expected Discharge Plan: Skilled Nursing Facility Barriers to Discharge: Continued Medical Work up   Patient Goals and CMS Choice   CMS Medicare.gov Compare Post Acute Care list provided to:: Patient Represenative (must comment) Choice offered to / list presented to : [facility] POA / Guardian      Expected Discharge Plan and Services       Living arrangements for the past 2 months: Assisted Living Facility                                      Prior Living Arrangements/Services Living arrangements for the past 2 months: Assisted Living Facility Lives with:: Facility Resident Patient language and need for interpreter reviewed:: Yes              Criminal Activity/Legal Involvement Pertinent to Current Situation/Hospitalization: No - Comment as needed  Activities of Daily Living   ADL Screening (condition at time of admission) Independently performs ADLs?: No Does the patient have a NEW difficulty with bathing/dressing/toileting/self-feeding that is expected to last >3 days?: No Does the patient have a NEW difficulty with getting in/out of bed, walking, or climbing stairs that is expected to last >3 days?: No Does the patient have a NEW difficulty with communication that is expected to last >3 days?: No Is the patient deaf or have difficulty hearing?: No Does the patient have difficulty seeing, even when wearing glasses/contacts?: No Does the patient have difficulty concentrating, remembering, or making decisions?: Yes  Permission Sought/Granted                  Emotional  Assessment         Alcohol / Substance Use: Not Applicable Psych Involvement: No (comment)  Admission diagnosis:  Alzheimer's disease (HCC) [G30.9, F02.80] Bradycardia [R00.1] Bradycardia with less than 30 beats per minute [R00.1] Syncope, unspecified syncope type [R55] Patient Active Problem List   Diagnosis Date Noted   Bradycardia with less than 30 beats per minute 02/11/2024   Incarcerated right inguinal hernia 02/11/2024   Syncope 02/11/2024   Alzheimer's disease (HCC) 02/11/2024   Right inguinal hernia 04/01/2023   Diarrhea 02/02/2019   Benign essential HTN 02/03/2018   Mild dementia (HCC) 11/25/2017   Mild cognitive impairment 05/27/2017   Loss of memory 02/04/2017   Frequent PVCs 07/02/2016   Palpitations 06/27/2016   Bradycardia 02/22/2016   Mixed hyperlipidemia 05/10/2014   PCP:  [facility]:   [id number] [place], [place] - [address] [place] [address] Phone: [phone] Fax: [phone]  [facility] - [place], [place] - [address] [place] [address] Phone: [phone] Fax: [phone]     Social Drivers of Health (SDOH) Social History: SDOH Screenings   Food Insecurity: No Food Insecurity (02/11/2024)  Housing: Low Risk  (02/11/2024)  Transportation Needs: No Transportation Needs (02/11/2024)  Utilities: Not At Risk (02/11/2024)  Social  Connections: Socially Isolated (02/11/2024)  Tobacco Use: Low Risk  (02/12/2024)   SDOH Interventions:     Readmission Risk Interventions     No data to display

## 2024-02-14 NOTE — Care Management Important Message (Signed)
 Important Message  Patient Details  Name: Seth Thompson MRN: [id number] Date of Birth: [birth year]   Important Message Given:  Yes - Medicare IM     Baylie Drakes W, CMA 02/14/2024, 10:51 AM

## 2024-02-14 NOTE — Plan of Care (Signed)
  Problem: Education: Goal: Knowledge of General Education information will improve Description: Including pain rating scale, medication(s)/side effects and non-pharmacologic comfort measures Outcome: Not Progressing   Problem: Clinical Measurements: Goal: Ability to maintain clinical measurements within normal limits will improve Outcome: Not Progressing Goal: Will remain free from infection Outcome: Not Progressing Goal: Diagnostic test results will improve Outcome: Not Progressing Goal: Respiratory complications will improve Outcome: Not Progressing Goal: Cardiovascular complication will be avoided Outcome: Not Progressing   Problem: Health Behavior/Discharge Planning: Goal: Ability to manage health-related needs will improve Outcome: Not Progressing   Problem: Coping: Goal: Level of anxiety will decrease Outcome: Not Progressing   Problem: Nutrition: Goal: Adequate nutrition will be maintained Outcome: Not Progressing

## 2024-02-15 ENCOUNTER — Inpatient Hospital Stay

## 2024-02-15 DIAGNOSIS — Z515 Encounter for palliative care: Secondary | ICD-10-CM

## 2024-02-15 DIAGNOSIS — Z789 Other specified health status: Secondary | ICD-10-CM | POA: Diagnosis not present

## 2024-02-15 DIAGNOSIS — G309 Alzheimer's disease, unspecified: Secondary | ICD-10-CM | POA: Diagnosis not present

## 2024-02-15 DIAGNOSIS — R001 Bradycardia, unspecified: Secondary | ICD-10-CM | POA: Diagnosis not present

## 2024-02-15 LAB — BASIC METABOLIC PANEL WITH GFR
Anion gap: 14 (ref 5–15)
BUN: 21 mg/dL (ref 8–23)
CO2: 24 mmol/L (ref 22–32)
Calcium: 9.2 mg/dL (ref 8.9–10.3)
Chloride: 100 mmol/L (ref 98–111)
Creatinine, Ser: 0.93 mg/dL (ref 0.61–1.24)
GFR, Estimated: >60 mL/min (ref >60)
Glucose, Bld: 110 mg/dL — ABNORMAL HIGH (ref 70–99)
Potassium: 3.7 mmol/L (ref 3.5–5.1)
Sodium: 138 mmol/L (ref 135–145)

## 2024-02-15 LAB — CBC WITH DIFFERENTIAL/PLATELET
Abs Immature Granulocytes: 0.02 10*3/uL (ref 0.00–0.07)
Basophils Absolute: 0 10*3/uL (ref 0.0–0.1)
Basophils Relative: 0 %
Eosinophils Absolute: 0.1 10*3/uL (ref 0.0–0.5)
Eosinophils Relative: 1 %
HCT: 51 % (ref 39.0–52.0)
Hemoglobin: 17.6 g/dL — ABNORMAL HIGH (ref 13.0–17.0)
Immature Granulocytes: 0 %
Lymphocytes Relative: 16 %
Lymphs Abs: 1.3 10*3/uL (ref 0.7–4.0)
MCH: 30.7 pg (ref 26.0–34.0)
MCHC: 34.5 g/dL (ref 30.0–36.0)
MCV: 89 fL (ref 80.0–100.0)
Monocytes Absolute: 0.5 10*3/uL (ref 0.1–1.0)
Monocytes Relative: 6 %
Neutro Abs: 5.9 10*3/uL (ref 1.7–7.7)
Neutrophils Relative %: 77 %
Platelets: 167 10*3/uL (ref 150–400)
RBC: 5.73 MIL/uL (ref 4.22–5.81)
RDW: 12.5 % (ref 11.5–15.5)
WBC: 7.8 10*3/uL (ref 4.0–10.5)
nRBC: 0 % (ref 0.0–0.2)

## 2024-02-15 MED ORDER — GUAIFENESIN-DM 100-10 MG/5ML PO SYRP: 5.0000 mL | ORAL_SOLUTION | ORAL | Status: DC | PRN

## 2024-02-15 NOTE — Progress Notes (Signed)
 Progress Note   Patient: Seth Thompson IRJ:[id number] DOB: Apr 04, 1946 DOA: 02/11/2024     4 DOS: the patient was seen and examined on 02/15/2024    Brief hospital course: From HPI "Seth Thompson is a 78 y.o. male with medical history significant of essential hypertension, dementia, frequent PVCs, hyperlipidemia who was brought in from a facility after the patient lost consciousness while sitting on the shower chair at the facility.  At the time patient was being seen he was pleasantly confused and so unable to contribute to history.  History was obtained from chart review as well as discussion with the ED physician Upon arrival to the emergency room patient was found to be bradycardic in the 50s and had a drop of his pulse into the 20s.  Hospitalist service was therefore contacted to admit patient for further management.  ED physician also spoke with cardiologist who will be seeing patient in consultation.  Patient also found to have incidental right inguinal hernia that was irreducible and surgeon was consulted however no acute surgical intervention recommended at this time "   Assessment and Plan:   Acute metabolic encephalopathy in a patient with underlying dementia. Possibly secondary to worsening dementia CT scan of the brain did not show any acute intracranial pathology Patient still confused No evidence of infection detected Continue delirium precaution Monitor neurochecks closely Patient currently requiring one-to-one sitter Normal TSH, B12 as well as folic acid levels  Syncope in the setting of bradycardia-improved Upon arrival to the emergency room patient was found to be bradycardic in the 50s and had a drop of his pulse into the 20s Cardiologist on board and case discussed Echo showed EF 60 to 65% with normal diastolic parameters I have discussed the case with cardiology team At this point no permanent pacemaker placement recommended Cardiologist on board and  considering possible EP evaluation tomorrow     Hypokalemia-continue repletion and monitoring   Incarcerated right sided inguinal hernia I have discussed the case with general surgery No surgical intervention recommended at this time Continue as needed pain medication   Essential hypertension, Avoid AV nodal blocking agents Other antihypertensives on hold as recommended by cardiology Patient placed on as needed hydralazine  for systolic blood pressure greater than 170   Severe dementia with behavioral disturbances Continue delirium precaution Continue one-to-one sitter   Hyperlipidemia-does not appear to be on statin   DVT prophylaxis-continue Lovenox     Advance Care Planning:   Code Status: Prior full code   Consults: Cardiology, general surgery   Family Communication: None present at bedside     Subjective:  Patient seen and examined at bedside this morning Attempt have been made to reach patient's contact listed however unsuccessful Patient had an episode of coughing for which chest x-ray have been ordered   Physical Exam:  General: He is not in acute distress.  Pleasantly confused    Appearance: He is not ill-appearing.  Cardiovascular:     Rate and Rhythm: Normal rate and regular rhythm.     Heart sounds: Normal heart sounds.  Pulmonary:     Effort: Pulmonary effort is normal. No accessory muscle usage.  Neurological:     General: No focal deficit present.  Confused but moving all extremities equally Psychiatric:    Confused Abdomen: Full nondistended, tenderness noted to the right inguinal area, right-sided inguinal hernia noted   Data Reviewed:   CT scan of the brain reviewed that did not show any acute intracranial pathology  Latest Ref Rng & Units 02/15/2024    4:43 AM 02/12/2024    5:23 AM 02/11/2024    6:38 AM  CBC  WBC 4.0 - 10.5 K/uL 7.8  5.6  6.5   Hemoglobin 13.0 - 17.0 g/dL 09.8  11.9  14.7   Hematocrit 39.0 - 52.0 % 51.0  45.9  45.3    Platelets 150 - 400 K/uL 167  125  122        Latest Ref Rng & Units 02/15/2024    4:43 AM 02/12/2024    5:23 AM 02/11/2024    6:38 AM  BMP  Glucose 70 - 99 mg/dL 829  562  130   BUN 8 - 23 mg/dL 21  12  16    Creatinine 0.61 - 1.24 mg/dL 8.65  7.84  6.96   Sodium 135 - 145 mmol/L 138  139  136   Potassium 3.5 - 5.1 mmol/L 3.7  3.9  3.3   Chloride 98 - 111 mmol/L 100  105  105   CO2 22 - 32 mmol/L 24  22  23    Calcium 8.9 - 10.3 mg/dL 9.2  9.2  8.6      Vitals:   02/14/24 1912 02/14/24 1915 02/15/24 0356 02/15/24 0915  BP: (!) 157/115 (!) 144/68 (!) 156/88 (!) 165/86  Pulse: (!) 51  (!) 59 (!) 56  Resp: 16   17  Temp: 98.6 F (37 C)  98.6 F (37 C) 98.7 F (37.1 C)  TempSrc:   Oral   SpO2: 99%  97% 100%  Weight:         Author: Ezzard Holms, MD 02/15/2024 2:07 PM  For on call review [email or web address].

## 2024-02-15 NOTE — Consult Note (Addendum)
 Consultation Note Date: 02/15/2024 at 1030  Patient Name: Seth Thompson  DOB: 03-06-1946  MRN: [id number]  Age / Sex: 78 y.o., male  PCP: Cherisse Cornell Referring Physician: Ezzard Holms, MD  HPI/Patient Profile: 78 y.o. male  with past medical history significant for essential hypertension, dementia, frequent PVC's, HLD and diverticulitis. He presented to ED via EMS from [facility] after having syncopal episode, sliding out of shower chair to the floor.  Upon arrival to ED, patient was found to be bradycardic in the 50's, dropping to the 20's. Other VS WNL. ED workup showed K+ 3.3, Ca 8.6, WBC 6.5.  Incidentally found irreducible R inguinal hernia. CT AP showed large right inguinal hernia containing multiple loops of distal small bowel with no bowel obstruction, sigmoid diverticulosis and 4 mm non obstructing left renal interpolar stone.  Patient was admitted to TRH service for further management. General surgery consulted for management of R inguinal hernia and recommends no surgical intervention at this time.   PMT consulted for goals of care discussion.    Clinical Assessment and Goals of Care: Extensive chart review completed prior to meeting patient including labs, vital signs, imaging, progress notes, orders, and available advanced directive documents from current and previous encounters. I then met with patient to discuss diagnosis prognosis, GOC, EOL wishes, disposition and options.  Ill-appearing, elderly male resting in bed with sitter at bedside.  He is alert to self, but unable to answer questions or follow commands due to advanced dementia. Safety mitts in place bilaterally. Even, unlabored respirations. He is in no distress Sitter reports patient has started to cough more today and is not eating as well as yesterday. Dr. Mariella Shore notified.   Patient has legal guardian through [facility] DSS. Called and left HIPAA appropriate VM. No return call received at this time. Will contact [place] DSS tomorrow to discuss GOC for this patient.   Primary Decision Maker LEGAL GUARDIAN- [name]- DSS, [place]   Physical Exam Vitals reviewed.  Constitutional:      General: He is not in acute distress.    Appearance: He is ill-appearing.  HENT:     Head: Normocephalic and atraumatic.  Pulmonary:     Effort: Pulmonary effort is normal. No respiratory distress.  Skin:    General: Skin is warm and dry.  Neurological:     Mental Status: He is alert. He is disoriented.    Recommendations/Plan: FULL CODE status as previously documented    Continue current supportive interventions PMT will contact legal guardian for GOC   Discussed plan of care with attending MD and primary RN.   Thank you for this consult. Palliative medicine will continue to follow and assist holistically.   Time Total: 55 minutes  Time spent includes: Detailed review of medical records (labs, imaging, vital signs), medically appropriate exam (mental status, respiratory, cardiac, skin), discussed with treatment team, counseling and educating patient, family and staff, documenting clinical information, medication management and coordination of care.     Ina Manas, AGNP-  [facility] Palliative Medicine Team  02/15/2024 11:19 AM  Office [phone]  Pager [phone]     Please contact Palliative Medicine Team providers via AMION for questions and concerns.

## 2024-02-15 NOTE — Plan of Care (Signed)
  Problem: Education: Goal: Knowledge of General Education information will improve Description: Including pain rating scale, medication(s)/side effects and non-pharmacologic comfort measures Outcome: Not Progressing   Problem: Education: Goal: Knowledge of General Education information will improve Description: Including pain rating scale, medication(s)/side effects and non-pharmacologic comfort measures Outcome: Not Progressing   Problem: Health Behavior/Discharge Planning: Goal: Ability to manage health-related needs will improve Outcome: Not Progressing   Problem: Clinical Measurements: Goal: Ability to maintain clinical measurements within normal limits will improve Outcome: Not Progressing Goal: Will remain free from infection Outcome: Not Progressing Goal: Diagnostic test results will improve Outcome: Not Progressing Goal: Respiratory complications will improve Outcome: Not Progressing Goal: Cardiovascular complication will be avoided Outcome: Not Progressing   Problem: Nutrition: Goal: Adequate nutrition will be maintained Outcome: Not Progressing   Problem: Activity: Goal: Risk for activity intolerance will decrease Outcome: Not Progressing   Problem: Coping: Goal: Level of anxiety will decrease Outcome: Not Progressing

## 2024-02-16 ENCOUNTER — Inpatient Hospital Stay

## 2024-02-16 DIAGNOSIS — Z66 Do not resuscitate: Secondary | ICD-10-CM | POA: Diagnosis not present

## 2024-02-16 DIAGNOSIS — Z7189 Other specified counseling: Secondary | ICD-10-CM

## 2024-02-16 DIAGNOSIS — G309 Alzheimer's disease, unspecified: Secondary | ICD-10-CM | POA: Diagnosis not present

## 2024-02-16 DIAGNOSIS — R001 Bradycardia, unspecified: Secondary | ICD-10-CM | POA: Diagnosis not present

## 2024-02-16 DIAGNOSIS — Z515 Encounter for palliative care: Secondary | ICD-10-CM | POA: Diagnosis not present

## 2024-02-16 LAB — BASIC METABOLIC PANEL WITH GFR
Anion gap: 9 (ref 5–15)
BUN: 21 mg/dL (ref 8–23)
CO2: 26 mmol/L (ref 22–32)
Calcium: 9.1 mg/dL (ref 8.9–10.3)
Chloride: 102 mmol/L (ref 98–111)
Creatinine, Ser: 0.98 mg/dL (ref 0.61–1.24)
GFR, Estimated: >60 mL/min (ref >60)
Glucose, Bld: 127 mg/dL — ABNORMAL HIGH (ref 70–99)
Potassium: 3.6 mmol/L (ref 3.5–5.1)
Sodium: 137 mmol/L (ref 135–145)

## 2024-02-16 LAB — CBC WITH DIFFERENTIAL/PLATELET
Abs Immature Granulocytes: 0.02 10*3/uL (ref 0.00–0.07)
Basophils Absolute: 0 10*3/uL (ref 0.0–0.1)
Basophils Relative: 0 %
Eosinophils Absolute: 0 10*3/uL (ref 0.0–0.5)
Eosinophils Relative: 1 %
HCT: 51.7 % (ref 39.0–52.0)
Hemoglobin: 18 g/dL — ABNORMAL HIGH (ref 13.0–17.0)
Immature Granulocytes: 0 %
Lymphocytes Relative: 13 %
Lymphs Abs: 0.8 10*3/uL (ref 0.7–4.0)
MCH: 30.9 pg (ref 26.0–34.0)
MCHC: 34.8 g/dL (ref 30.0–36.0)
MCV: 88.7 fL (ref 80.0–100.0)
Monocytes Absolute: 0.6 10*3/uL (ref 0.1–1.0)
Monocytes Relative: 9 %
Neutro Abs: 5 10*3/uL (ref 1.7–7.7)
Neutrophils Relative %: 77 %
Platelets: 146 10*3/uL — ABNORMAL LOW (ref 150–400)
RBC: 5.83 MIL/uL — ABNORMAL HIGH (ref 4.22–5.81)
RDW: 12.5 % (ref 11.5–15.5)
WBC: 6.4 10*3/uL (ref 4.0–10.5)
nRBC: 0 % (ref 0.0–0.2)

## 2024-02-16 MED ORDER — AMLODIPINE BESYLATE 10 MG PO TABS
10.0000 mg | ORAL_TABLET | Freq: Every day | ORAL | Status: DC
  Administered 2024-02-17 – 2024-03-18 (×31): 10 mg via ORAL
  Filled 2024-02-16 (×31): qty 1

## 2024-02-16 MED ORDER — SODIUM CHLORIDE 0.9 % IV SOLN
2.0000 g | INTRAVENOUS | Status: DC
  Filled 2024-02-16: qty 20

## 2024-02-16 MED ORDER — CEPHALEXIN 500 MG PO CAPS: 500.0000 mg | ORAL_CAPSULE | Freq: Two times a day (BID) | ORAL | Status: DC

## 2024-02-16 MED ORDER — AMOXICILLIN-POT CLAVULANATE 875-125 MG PO TABS
1.0000 | ORAL_TABLET | Freq: Two times a day (BID) | ORAL | Status: AC
  Administered 2024-02-16 – 2024-02-20 (×10): 1 via ORAL
  Filled 2024-02-16 (×10): qty 1

## 2024-02-16 MED ORDER — SODIUM CHLORIDE 0.9 % IV SOLN
500.0000 mg | INTRAVENOUS | Status: DC
  Filled 2024-02-16: qty 5

## 2024-02-16 NOTE — Progress Notes (Addendum)
 Palliative Care Progress Note, Assessment & Plan   Patient Name: Seth Thompson       Date: 02/16/2024 DOB: 01/07/1946  Age: 78 y.o. MRN#: [id number] Attending Physician: Ezzard Holms, MD Primary Care Physician: Cherisse Cornell Admit Date: 02/11/2024  Subjective: Deferred as to not agitate patient while sleeping.  HPI:  78 y.o. male  with past medical history significant for essential hypertension, dementia, frequent PVC's, HLD and diverticulitis. He presented to ED via EMS from [facility] after having syncopal episode, sliding out of shower chair to the floor.  Upon arrival to ED, patient was found to be bradycardic in the 50's, dropping to the 20's. Other VS WNL. ED workup showed K+ 3.3, Ca 8.6, WBC 6.5.  Incidentally found irreducible R inguinal hernia. CT AP showed large right inguinal hernia containing multiple loops of distal small bowel with no bowel obstruction, sigmoid diverticulosis and 4 mm non obstructing left renal interpolar stone.   Patient was admitted to TRH service for further management. General surgery consulted for management of R inguinal hernia and recommends no surgical intervention at this time.    PMT consulted for goals of care discussion.   Summary of counseling/coordination of care: Extensive chart review completed prior to meeting patient including labs, vital signs, imaging, progress notes, orders, and available advanced directive documents from current and previous encounters.   After reviewing the patient's chart and assessing the patient at bedside, I spoke with Candace Gobel and Laportia McCullough, legal guardians with [place] [facility], in regards to symptom management and goals of care.   Elderly, ill-appearing male sleepy soundly in bed. Even, unlabored  respirations. He is in no distress. Telesitter at bedside.   Spoke with Kennieth Peaches and Laportia McCullough, legal guardians [facility] [employer] of Social Services, in regards to patient and goals of care. Discussed bradycardia that has improved, evaluated by cardiology with no recommendation for PPM at this time or other treatment. Right inguinal hernia evaluated by general surgery with no surgical intervention recommended at this time. Due to severe dementia that appears to be worsening, provided palliative recommendation from full code to DNR/DNI. Candace shares that she has spoken with this patient's family and all are in agreement that a DNR/DNI would be best for this patient. Confirmed with Candace and Laportia change in code status to DNR/DNI.   Discussed that patient would be followed by outpatient palliative care at his facility for decline and future transition to hospice care when appropriate. Both Candace and Laportia verbally agree to this plan of care.     Physical Exam Vitals reviewed.  Constitutional:      General: He is not in acute distress.    Appearance: He is ill-appearing.  HENT:     Head: Normocephalic and atraumatic.  Pulmonary:     Effort: Pulmonary effort is normal. No respiratory distress.  Skin:    General: Skin is warm and dry.     Comments: Safety mitts in place with L arm restraint in place    Recommendations/Plan: DNR/DNI per legal guardian at [facility]   Continue current supportive interventions D/C to SNF per TOC once medically stable Referral to [facility] liaison for OP palliative to follow  at facility        Total Time 50 minutes    Dicussed plan of care with Dr. Mariella Shore, [facility] liaison, [place], CM, Leonarda Rakers and primary RN.    Time spent includes: Detailed review of medical records (labs, imaging, vital signs), medically appropriate exam (mental status, respiratory, cardiac, skin), discussed with treatment team, counseling and educating patient,  family and staff, documenting clinical information, medication management and coordination of care.     Ina Manas, Joyice Nodal- [facility] Palliative Medicine Team  02/16/2024 11:39 AM  Office [phone]  Pager [phone]

## 2024-02-16 NOTE — Progress Notes (Signed)
 [facility] [phone] [facility]) Hospital Liaison Note  Notified by Anders Katz of caregiver request for [facility] Palliative services at facility after discharge.   [facility] liaison will follow patient for discharge disposition.   Please call with any Hospice/Palliative related questions or concerns.   Thank you for the opportunity to participate in this patient's care  Dwane Gitelman RN, [facility] Liaison [phone]

## 2024-02-16 NOTE — Progress Notes (Signed)
 Progress Note   Patient: Seth Thompson ION:[id number] DOB: March 28, 1946 DOA: 02/11/2024     5 DOS: the patient was seen and examined on 02/16/2024    Brief hospital course: From HPI "Seth Thompson is a 78 y.o. male with medical history significant of essential hypertension, dementia, frequent PVCs, hyperlipidemia who was brought in from a facility after the patient lost consciousness while sitting on the shower chair at the facility.  At the time patient was being seen he was pleasantly confused and so unable to contribute to history.  History was obtained from chart review as well as discussion with the ED physician Upon arrival to the emergency room patient was found to be bradycardic in the 50s and had a drop of his pulse into the 20s.  Hospitalist service was therefore contacted to admit patient for further management.  ED physician also spoke with cardiologist who will be seeing patient in consultation.  Patient also found to have incidental right inguinal hernia that was irreducible and surgeon was consulted however no acute surgical intervention recommended at this time "   Assessment and Plan:   Acute metabolic encephalopathy in a patient with underlying dementia. Possibly secondary to worsening dementia CT scan of the brain did not show any acute intracranial pathology Patient still confused No evidence of infection detected Continue delirium precaution Monitor neurochecks closely Patient currently requiring one-to-one sitter Normal TSH, B12 as well as folic acid levels  Syncope in the setting of bradycardia-improved Upon arrival to the emergency room patient was found to be bradycardic in the 50s and had a drop of his pulse into the 20s Cardiologist on board and case discussed Echo showed EF 60 to 65% with normal diastolic parameters I have discussed the case with cardiology team At this point no permanent pacemaker placement recommended Cardiologist on board and  considering possible EP evaluation tomorrow     Hypokalemia-continue repletion and monitoring   Incarcerated right sided inguinal hernia I have discussed the case with general surgery No surgical intervention recommended at this time Continue as needed pain medication   Essential hypertension, Avoid AV nodal blocking agents Other antihypertensives on hold as recommended by cardiology Patient placed on as needed hydralazine  for systolic blood pressure greater than 170   Severe dementia with behavioral disturbances Continue delirium precaution Continue one-to-one sitter   Hyperlipidemia-does not appear to be on statin   DVT prophylaxis-continue Lovenox     Advance Care Planning:   Code Status: Prior full code   Consults: Cardiology, general surgery   Family Communication: None present at bedside     Subjective:  Patient seen and examined at bedside this morning Attempt have been made to reach patient's contact listed however unsuccessful Patient had an episode of coughing for which chest x-ray have been ordered   Physical Exam:  General: He is not in acute distress.  Pleasantly confused    Appearance: He is not ill-appearing.  Cardiovascular:     Rate and Rhythm: Normal rate and regular rhythm.     Heart sounds: Normal heart sounds.  Pulmonary:     Effort: Pulmonary effort is normal. No accessory muscle usage.  Neurological:     General: No focal deficit present.  Confused but moving all extremities equally Psychiatric:    Confused Abdomen: Full nondistended, tenderness noted to the right inguinal area, right-sided inguinal hernia noted   Data Reviewed:   CT scan of the brain reviewed that did not show any acute intracranial pathology  Latest Ref Rng & Units 02/16/2024    5:19 AM 02/15/2024    4:43 AM 02/12/2024    5:23 AM  BMP  Glucose 70 - 99 mg/dL 956  213  086   BUN 8 - 23 mg/dL 21  21  12    Creatinine 0.61 - 1.24 mg/dL 5.78  4.69  6.29   Sodium 135 -  145 mmol/L 137  138  139   Potassium 3.5 - 5.1 mmol/L 3.6  3.7  3.9   Chloride 98 - 111 mmol/L 102  100  105   CO2 22 - 32 mmol/L 26  24  22    Calcium 8.9 - 10.3 mg/dL 9.1  9.2  9.2      Vitals:   02/15/24 1626 02/15/24 2105 02/16/24 0358 02/16/24 0755  BP: 134/75 (!) 172/100 (!) 159/108 (!) 148/92  Pulse: 86 71 79 (!) 57  Resp: 18 18 18 18   Temp: 98 F (36.7 C) 98.7 F (37.1 C) 98.4 F (36.9 C) 98 F (36.7 C)  TempSrc:  Oral    SpO2: 100% 93% 99% 99%  Weight:          Latest Ref Rng & Units 02/16/2024    5:19 AM 02/15/2024    4:43 AM 02/12/2024    5:23 AM  CBC  WBC 4.0 - 10.5 K/uL 6.4  7.8  5.6   Hemoglobin 13.0 - 17.0 g/dL 52.8  41.3  24.4   Hematocrit 39.0 - 52.0 % 51.7  51.0  45.9   Platelets 150 - 400 K/uL 146  167  125      Author: Ezzard Holms, MD 02/16/2024 3:54 PM  For on call review [email or web address].

## 2024-02-16 NOTE — Progress Notes (Addendum)
 Call received from CCMD that patient had a 2.19 second pause on tele. Dr. Mariella Shore notified via secure chat. No new orders at this time.

## 2024-02-16 NOTE — TOC Progression Note (Signed)
 Transition of Care [facility]) - Progression Note    Patient Details  Name: Seth Thompson MRN: [id number] Date of Birth: [birth year]  [facility] CM/SW Contact  Loman Risk, RN Phone Number: 02/16/2024, 3:23 PM  Clinical Narrative:     [name] with DSS selects [facility]  Update:  Spoke with [name] at DSS she confirms they would like [facility] with outpatient palliative if insurance approves.  Auth initiated with [facility] requested for [facility] and transport for [facility]. Outpatient palliative referral made to [facility] with [facility]  Patient currently with restraints per bedside RN. Will have to be atleast 24 hours with out to admit to SNF  Expected Discharge Plan: Skilled Nursing Facility Barriers to Discharge: Continued Medical Work up  Expected Discharge Plan and Services       Living arrangements for the past 2 months: Assisted Living Facility                                       Social Determinants of Health (SDOH) Interventions SDOH Screenings   Food Insecurity: No Food Insecurity (02/11/2024)  Housing: Low Risk  (02/11/2024)  Transportation Needs: No Transportation Needs (02/11/2024)  Utilities: Not At Risk (02/11/2024)  Social Connections: Socially Isolated (02/11/2024)  Tobacco Use: Low Risk  (02/12/2024)    Readmission Risk Interventions     No data to display

## 2024-02-16 NOTE — Plan of Care (Signed)

## 2024-02-16 NOTE — TOC Progression Note (Signed)
 Transition of Care [facility]) - Progression Note    Patient Details  Name: Seth Thompson MRN: [id number] Date of Birth: [birth year]  Transition [facility]) CM/SW Contact  Loman Risk, RN Phone Number: 02/16/2024, 11:55 AM  Clinical Narrative:     Spoke with [name] with DSS and provided bed offers.  She is to review with her supervisor and call TOC back with a decision  Expected Discharge Plan: Skilled Nursing Facility Barriers to Discharge: Continued Medical Work up  Expected Discharge Plan and Services       Living arrangements for the past 2 months: Assisted Living Facility                                       Social Determinants of Health (SDOH) Interventions SDOH Screenings   Food Insecurity: No Food Insecurity (02/11/2024)  Housing: Low Risk  (02/11/2024)  Transportation Needs: No Transportation Needs (02/11/2024)  Utilities: Not At Risk (02/11/2024)  Social Connections: Socially Isolated (02/11/2024)  Tobacco Use: Low Risk  (02/12/2024)    Readmission Risk Interventions     No data to display

## 2024-02-16 NOTE — Progress Notes (Addendum)
 [facility] CLINIC CARDIOLOGY PROGRESS NOTE       Patient ID: CRUIZ Seth Thompson: [id number] DOB/AGE: 11/01/1945 78 y.o.  Admit date: 02/11/2024 Referring Physician Dr. Alvenia Aus Primary Physician Donold Galla, [facility] Primary Cardiologist Kolwalksi (last seen 2022) Reason for Consultation bradycardia, syncope  HPI: Seth Thompson is a 78 y.o. male  with a past medical history of hypertension, sinus bradycardia, severe alzheimer dementia who presented to the ED on 02/11/2024 for syncope. Cardiology was consulted for further evaluation.   Interval History: -Patient with severe dementia, seen and examined this AM and eating breakfast with nurse at bedside. -HR stable this AM in 70s. Overnight Tele showed no significant events. No significant bradycardia, no sustained HR < 50, no significant AVB or significant pauses. Longest pause overnight 2.4 seconds. BP elevated this AM. -Electrolytes remain stable.  -Palliative to consult legal guardian for GOC.   Review of systems complete and found to be negative unless listed above    Past Medical History:  Diagnosis Date   Benign essential HTN 02/03/2018   Dementia (HCC)    Diverticulitis of large intestine with abscess without bleeding 04/03/2018   Frequent PVCs 07/02/2016   Hyperlipidemia    Right inguinal hernia 04/01/2023    Past Surgical History:  Procedure Laterality Date   CHOLECYSTECTOMY     COLONOSCOPY WITH PROPOFOL  N/A 07/24/2023   Procedure: COLONOSCOPY WITH PROPOFOL ;  Surgeon: Luke Salaam, MD;  Location: [facility] ENDOSCOPY;  Service: Gastroenterology;  Laterality: N/A;   COLONOSCOPY WITH PROPOFOL  N/A 08/20/2023   Procedure: COLONOSCOPY WITH PROPOFOL ;  Surgeon: Luke Salaam, MD;  Location: [facility] ENDOSCOPY;  Service: Gastroenterology;  Laterality: N/A;    Medications Prior to Admission  Medication Sig Dispense Refill Last Dose/Taking   cyanocobalamin 1000 MCG tablet Take 1,000 mcg by mouth daily.   02/10/2024 at  5:36 AM    guaifenesin (ROBITUSSIN) 100 MG/5ML syrup Take 10 mLs by mouth every 4 (four) hours as needed for cough or congestion.   Taking As Needed   melatonin 5 MG TABS Take 5 mg by mouth at bedtime.   02/10/2024 at  5:38 PM   Multiple Vitamin (MULTIVITAMIN) capsule Take 1 capsule by mouth daily.   02/10/2024 at  5:36 AM   polyethylene glycol (MIRALAX  / GLYCOLAX ) 17 g packet Take 17 g by mouth daily as needed for mild constipation.   Taking As Needed   senna (SENOKOT) 8.6 MG TABS tablet Take 1 tablet by mouth 3 (three) times a week. Mondays, Wednesday, and Friday   02/09/2024   sertraline  (ZOLOFT ) 50 MG tablet Take 50 mg by mouth at bedtime.   02/10/2024 at  5:38 PM   simethicone (MYLICON) 125 MG chewable tablet Chew 125 mg by mouth every 6 (six) hours as needed (gas/burping).   Taking As Needed   Social History   Socioeconomic History   Marital status: Divorced    Spouse name: Not on file   Number of children: Not on file   Years of education: Not on file   Highest education level: Not on file  Occupational History   Not on file  Tobacco Use   Smoking status: Never    Passive exposure: Never   Smokeless tobacco: Never  Vaping Use   Vaping status: Never Used  Substance and Sexual Activity   Alcohol use: Not Currently   Drug use: Not Currently   Sexual activity: Not on file  Other Topics Concern   Not on file  Social History Narrative   Not on  file   Social Drivers of Health   Financial Resource Strain: Not on file  Food Insecurity: No Food Insecurity (02/11/2024)   Hunger Vital Sign    Worried About Running Out of Food in the Last Year: Never true    Ran Out of Food in the Last Year: Never true  Transportation Needs: No Transportation Needs (02/11/2024)   PRAPARE - Administrator, Civil Service (Medical): No    Lack of Transportation (Non-Medical): No  Physical Activity: Not on file  Stress: Not on file  Social Connections: Socially Isolated (02/11/2024)   Social Connection and  Isolation Panel [NHANES]    Frequency of Communication with Friends and Family: Never    Frequency of Social Gatherings with Friends and Family: Twice a week    Attends Religious Services: Never    Database administrator or Organizations: No    Attends Banker Meetings: Never    Marital Status: Divorced  Catering manager Violence: Patient Unable To Answer (02/11/2024)   Humiliation, Afraid, Rape, and Kick questionnaire    Fear of Current or Ex-Partner: Patient unable to answer    Emotionally Abused: Patient unable to answer    Physically Abused: Patient unable to answer    Sexually Abused: Patient unable to answer    History reviewed. No pertinent family history.   Vitals:   02/15/24 1626 02/15/24 2105 02/16/24 0358 02/16/24 0755  BP: 134/75 (!) 172/100 (!) 159/108 (!) 148/92  Pulse: 86 71 79 (!) 57  Resp: 18 18 18 18   Temp: 98 F (36.7 C) 98.7 F (37.1 C) 98.4 F (36.9 C) 98 F (36.7 C)  TempSrc:  Oral    SpO2: 100% 93% 99% 99%  Weight:        PHYSICAL EXAM General: Chronically ill appearing elderly male, well nourished, in no acute distress. HEENT: Normocephalic and atraumatic. Neck: No JVD.   Lungs: Normal respiratory effort on room air. Clear bilaterally to auscultation. No wheezes, crackles, rhonchi.  Heart: HRR, slower heart rate. Normal S1 and S2 without gallops or murmurs.  Abdomen: Non-distended appearing.  Msk: Normal strength and tone for age. Extremities: Warm and well perfused. No clubbing, cyanosis. No edema.   Labs: Basic Metabolic Panel: Recent Labs    02/15/24 0443 02/16/24 0519  NA 138 137  K 3.7 3.6  CL 100 102  CO2 24 26  GLUCOSE 110* 127*  BUN 21 21  CREATININE 0.93 0.98  CALCIUM 9.2 9.1   Liver Function Tests: No results for input(s): "AST", "ALT", "ALKPHOS", "BILITOT", "PROT", "ALBUMIN" in the last 72 hours.  No results for input(s): "LIPASE", "AMYLASE" in the last 72 hours. CBC: Recent Labs    02/15/24 0443  02/16/24 0519  WBC 7.8 6.4  NEUTROABS 5.9 5.0  HGB 17.6* 18.0*  HCT 51.0 51.7  MCV 89.0 88.7  PLT 167 146*   Cardiac Enzymes: No results for input(s): "CKTOTAL", "CKMB", "CKMBINDEX", "TROPONINIHS" in the last 72 hours.  BNP: No results for input(s): "BNP" in the last 72 hours. D-Dimer: No results for input(s): "DDIMER" in the last 72 hours. Hemoglobin A1C: No results for input(s): "HGBA1C" in the last 72 hours. Fasting Lipid Panel: No results for input(s): "CHOL", "HDL", "LDLCALC", "TRIG", "CHOLHDL", "LDLDIRECT" in the last 72 hours. Thyroid  Function Tests: Recent Labs    02/13/24 1934  TSH 3.206   Anemia Panel: Recent Labs    02/13/24 1934  VITAMINB12 759  FOLATE 17.4     Radiology: [facility] Chest  Port 1 View Result Date: 02/15/2024 CLINICAL DATA:  Cough. EXAM: PORTABLE CHEST 1 VIEW COMPARISON:  Remote radiograph 05/06/2013 FINDINGS: Lung volumes are low. The heart is prominent in size likely accentuated by technique. Mediastinal contours are normal. Patchy opacity in the medial right lung base. No pulmonary edema, pleural effusion or pneumothorax. IMPRESSION: Patchy opacity in the medial right lung base, suspicious for pneumonia. Electronically Signed   By: Chadwick Colonel M.D.   On: 02/15/2024 15:13   ECHOCARDIOGRAM COMPLETE Result Date: 02/12/2024    ECHOCARDIOGRAM REPORT   Patient Name:   OLUFEMI AMADEO Date of Exam: 02/12/2024 Medical Rec #:  [id number]         Height:       70.0 in Accession #:    [id number]        Weight:       198.2 lb Date of Birth:  28-Jun-1946         BSA:          2.079 m Patient Age:    77 years          BP:           154/70 mmHg Patient Gender: M                 HR:           70 bpm. Exam Location:  [facility] Procedure: 2D Echo, Cardiac Doppler and Color Doppler (Both Spectral and Color            Flow Doppler were utilized during procedure). Indications:     Syncope R55  History:         Patient has no prior history of Echocardiogram examinations.                   Risk Factors:Hypertension and Dyslipidemia. Dementia.  Sonographer:     Broadus Canes Referring Phys:  [id number] Qamar Rosman Diagnosing Phys: Joetta Mustache  Sonographer Comments: Technically challenging study due to limited acoustic windows, no parasternal window and no subcostal window. IMPRESSIONS  1. Left ventricular ejection fraction, by estimation, is 60 to 65%. The left ventricle has normal function. The left ventricle has no regional wall motion abnormalities. There is mild left ventricular hypertrophy. Left ventricular diastolic parameters were normal.  2. Right ventricular systolic function is normal. The right ventricular size is normal.  3. The mitral valve is normal in structure. No evidence of mitral valve regurgitation.  4. The aortic valve is normal in structure. Aortic valve regurgitation is not visualized. FINDINGS  Left Ventricle: Left ventricular ejection fraction, by estimation, is 60 to 65%. The left ventricle has normal function. The left ventricle has no regional wall motion abnormalities. The left ventricular internal cavity size was normal in size. There is  mild left ventricular hypertrophy. Left ventricular diastolic parameters were normal. Right Ventricle: The right ventricular size is normal. No increase in right ventricular wall thickness. Right ventricular systolic function is normal. Left Atrium: Left atrial size was normal in size. Right Atrium: Right atrial size was normal in size. Pericardium: There is no evidence of pericardial effusion. Mitral Valve: The mitral valve is normal in structure. No evidence of mitral valve regurgitation. Tricuspid Valve: The tricuspid valve is normal in structure. Tricuspid valve regurgitation is trivial. Aortic Valve: The aortic valve is normal in structure. Aortic valve regurgitation is not visualized. Aortic valve mean gradient measures 2.0 mmHg. Aortic valve peak gradient measures 4.4 mmHg. Aortic valve area, by VTI measures 3.53  cm.  Pulmonic Valve: The pulmonic valve was not well visualized. Pulmonic valve regurgitation is not visualized. Aorta: The aortic root is normal in size and structure. IAS/Shunts: The interatrial septum was not well visualized.  LEFT VENTRICLE PLAX 2D LVIDd:         3.50 cm   Diastology LVIDs:         2.50 cm   LV e' medial:    8.38 cm/s LV PW:         1.00 cm   LV E/e' medial:  8.6 LV IVS:        1.10 cm   LV e' lateral:   11.10 cm/s LVOT diam:     2.20 cm   LV E/e' lateral: 6.5 LV SV:         73 LV SV Index:   35 LVOT Area:     3.80 cm  RIGHT VENTRICLE RV Basal diam:  3.00 cm RV Mid diam:    2.10 cm LEFT ATRIUM             Index        RIGHT ATRIUM           Index LA diam:        3.90 cm 1.88 cm/m   RA Area:     10.10 cm LA Vol (A2C):   36.4 ml 17.51 ml/m  RA Volume:   18.30 ml  8.80 ml/m LA Vol (A4C):   36.6 ml 17.60 ml/m LA Biplane Vol: 37.4 ml 17.99 ml/m  AORTIC VALVE AV Area (Vmax):    3.41 cm AV Area (Vmean):   3.65 cm AV Area (VTI):     3.53 cm AV Vmax:           105.00 cm/s AV Vmean:          72.200 cm/s AV VTI:            0.207 m AV Peak Grad:      4.4 mmHg AV Mean Grad:      2.0 mmHg LVOT Vmax:         94.20 cm/s LVOT Vmean:        69.400 cm/s LVOT VTI:          0.192 m LVOT/AV VTI ratio: 0.93  AORTA Ao Root diam: 3.50 cm MITRAL VALVE               TRICUSPID VALVE MV Area (PHT): 2.93 cm    TR Peak grad:   18.5 mmHg MV Decel Time: 259 msec    TR Vmax:        215.00 cm/s MV E velocity: 72.30 cm/s MV A velocity: 91.70 cm/s  SHUNTS MV E/A ratio:  0.79        Systemic VTI:  0.19 m                            Systemic Diam: 2.20 cm Joetta Mustache Electronically signed by Joetta Mustache Signature Date/Time: 02/12/2024/4:12:59 PM    Final    CT ABDOMEN PELVIS W CONTRAST Result Date: 02/11/2024 CLINICAL DATA:  Right inguinal hernia. Concern for bowel obstruction. EXAM: CT ABDOMEN AND PELVIS WITH CONTRAST TECHNIQUE: Multidetector CT imaging of the abdomen and pelvis was performed using the standard protocol  following bolus administration of intravenous contrast. RADIATION DOSE REDUCTION: This exam was performed according to the departmental dose-optimization program which includes automated exposure control, adjustment of the mA and/or kV  according to patient size and/or use of iterative reconstruction technique. CONTRAST:  OMNIPAQUE  IOHEXOL  300 MG/ML  SOLN COMPARISON:  CT abdomen pelvis dated 03/08/2023. FINDINGS: Lower chest: Bibasilar atelectasis/scarring. The visualized lung bases are otherwise clear. Coronary vascular calcification. No intra-abdominal free air or free fluid. Hepatobiliary: The liver is unremarkable. There is mild dilatation, post cholecystectomy. No retained calcified stone noted in the central CBD. Pancreas: Unremarkable. No pancreatic ductal dilatation or surrounding inflammatory changes. Spleen: Normal in size without focal abnormality. Adrenals/Urinary Tract: The adrenal glands unremarkable. There is a 2.5 cm right renal inferior pole cyst. There is a 4 mm nonobstructing left renal interpolar stone. There is no hydronephrosis on either side. There is symmetric enhancement and excretion of contrast by both kidneys. The visualized ureters and urinary bladder appear unremarkable. Stomach/Bowel: There is a large right inguinal hernia containing multiple loops of distal small bowel. There is no bowel obstruction. There is sigmoid diverticulosis. The appendix is normal. Vascular/Lymphatic: Mild aortoiliac atherosclerotic disease. The IVC is unremarkable. No portal venous gas. There is no adenopathy. Reproductive: The prostate and seminal vesicles are grossly unremarkable. No pelvic mass. Other: None Musculoskeletal: Osteopenia with degenerative changes of the spine. No acute osseous pathology. IMPRESSION: 1. Large right inguinal hernia containing multiple loops of distal small bowel. No bowel obstruction. 2. Sigmoid diverticulosis. Normal appendix. 3. A 4 mm nonobstructing left renal  interpolar stone. No hydronephrosis. 4.  Aortic Atherosclerosis (ICD10-I70.0). Electronically Signed   By: Angus Bark M.D.   On: 02/11/2024 13:56   US  Venous Img Lower Unilateral Right Result Date: 02/11/2024 CLINICAL DATA:  Right lower extremity edema EXAM: RIGHT LOWER EXTREMITY VENOUS DOPPLER ULTRASOUND TECHNIQUE: Gray-scale sonography with compression, as well as color and duplex ultrasound, were performed to evaluate the deep venous system(s) from the level of the common femoral vein through the popliteal and proximal calf veins. COMPARISON:  None Available. FINDINGS: VENOUS Normal compressibility of the common femoral, superficial femoral, and popliteal veins, as well as the visualized calf veins. Visualized portions of profunda femoral vein and great saphenous vein unremarkable. No filling defects to suggest DVT on grayscale or color Doppler imaging. Doppler waveforms show normal direction of venous flow, normal respiratory plasticity and response to augmentation. Limited views of the contralateral common femoral vein are unremarkable. OTHER Edema is present within the superficial soft tissues. Limitations: none IMPRESSION: 1. No evidence of right lower extremity DVT. Electronically Signed   By: Reagan Camera M.D.   On: 02/11/2024 08:43   CT Head Wo Contrast Result Date: 02/11/2024 CLINICAL DATA:  Minor head trauma EXAM: CT HEAD WITHOUT CONTRAST CT CERVICAL SPINE WITHOUT CONTRAST TECHNIQUE: Multidetector CT imaging of the head and cervical spine was performed following the standard protocol without intravenous contrast. Multiplanar CT image reconstructions of the cervical spine were also generated. RADIATION DOSE REDUCTION: This exam was performed according to the departmental dose-optimization program which includes automated exposure control, adjustment of the mA and/or kV according to patient size and/or use of iterative reconstruction technique. COMPARISON:  04/14/2023 FINDINGS: CT HEAD FINDINGS  Brain: No evidence of acute infarction, hemorrhage, hydrocephalus, extra-axial collection or mass lesion/mass effect. Advanced and generalized cortical atrophy. Vascular: No hyperdense vessel or unexpected calcification. Skull: Normal. Negative for fracture or focal lesion. Sinuses/Orbits: No evidence of injury CT CERVICAL SPINE FINDINGS Alignment: Normal. Skull base and vertebrae: No acute fracture. No primary bone lesion or focal pathologic process. Soft tissues and spinal canal: No prevertebral fluid or swelling. No visible canal hematoma. Disc levels: Generalized  degenerative disc space narrowing and endplate spurring. Upper chest: No evidence of injury IMPRESSION: No evidence of acute intracranial or cervical spine injury. Advanced cortical atrophy. Electronically Signed   By: Ronnette Coke M.D.   On: 02/11/2024 07:19   CT Cervical Spine Wo Contrast Result Date: 02/11/2024 CLINICAL DATA:  Minor head trauma EXAM: CT HEAD WITHOUT CONTRAST CT CERVICAL SPINE WITHOUT CONTRAST TECHNIQUE: Multidetector CT imaging of the head and cervical spine was performed following the standard protocol without intravenous contrast. Multiplanar CT image reconstructions of the cervical spine were also generated. RADIATION DOSE REDUCTION: This exam was performed according to the departmental dose-optimization program which includes automated exposure control, adjustment of the mA and/or kV according to patient size and/or use of iterative reconstruction technique. COMPARISON:  04/14/2023 FINDINGS: CT HEAD FINDINGS Brain: No evidence of acute infarction, hemorrhage, hydrocephalus, extra-axial collection or mass lesion/mass effect. Advanced and generalized cortical atrophy. Vascular: No hyperdense vessel or unexpected calcification. Skull: Normal. Negative for fracture or focal lesion. Sinuses/Orbits: No evidence of injury CT CERVICAL SPINE FINDINGS Alignment: Normal. Skull base and vertebrae: No acute fracture. No primary bone  lesion or focal pathologic process. Soft tissues and spinal canal: No prevertebral fluid or swelling. No visible canal hematoma. Disc levels: Generalized degenerative disc space narrowing and endplate spurring. Upper chest: No evidence of injury IMPRESSION: No evidence of acute intracranial or cervical spine injury. Advanced cortical atrophy. Electronically Signed   By: Ronnette Coke M.D.   On: 02/11/2024 07:19    ECHO as above  TELEMETRY reviewed by me 02/16/2024: sinus bradycardia, rate 70s (no significant events overnight on tele)  EKG reviewed by me: sinus bradycardia with short sinus pause, rate 58 bpm   Data reviewed by me 02/16/2024: last 24h vitals tele labs imaging I/O hospitalist progress note.  Principal Problem:   Bradycardia with less than 30 beats per minute Active Problems:   Incarcerated right inguinal hernia   Syncope   Alzheimer's disease (HCC)    ASSESSMENT AND PLAN:  Seth Thompson is a 78 y.o. male  with a past medical history of hypertension, sinus bradycardia, severe alzheimer dementia who presented to the ED on 02/11/2024 for syncope. Cardiology was consulted for further evaluation.   # Bradycardia # Syncope  # Alzheimer dementia  # Hypertension Patient with severe dementia reports to ED with bradycardia and a witnessed syncopal event. Trop neg x 1. EKG in ED and per tele patient sinus bradycardia with sinus pauses that are less than 3 seconds. Longest pause was 2.1 seconds on tele on 05/07. Per tele there has been no significant bradycardia, no sustained HR < 50, no significant AVB or significant pauses since admission. Echo this admission EF 60-65%, no RWMA, no significant valvular disease. Over the weekend there was intermittent junctional and ventricular escape beats noted with rate in low 40s/high 30s, although not sustaining for long duration. Per tele overnight with no significant events. Longest pause was 2.4 seconds, no pauses that are longer than 3  seconds. HR stable and BP elevated this AM.   -Increase amlodipine  to 10 mg daily  -Avoid AVN blockers due to patients bradycardia. -No indication for PPM at this time as patient has no evidence of high grade AV block and no evidence of sinus pause longer than 3 seconds, especially with patients severe dementia.  -Will not place holter due to patients baseline advanced dementia.  -Palliative to consult legal guardian for GOC.   This patient's plan of care was discussed and created  with Dr. Custovic and she is in agreement.  Signed: Creighton Doffing, PA-C  02/16/2024, 11:21 AM [facility]

## 2024-02-16 NOTE — Progress Notes (Signed)
 Physical Therapy Treatment Patient Details Name: Seth Thompson MRN: [id number] DOB: January 07, 1946 Today's Date: 02/16/2024   History of Present Illness Pt is a 78 year old male admitted after LOC in shower, admitted with acute metabolic encephalopathy, syncope in the setting of bradycardia, hypokalemia, incarcerated right sided inguinal hernia. PMH significant for essential hypertension, dementia, frequent PVCs, and hyperlipidemia.    PT Comments  Pt somewhat lethargic but easily awakens to minor stimuli.  Pt unable to follow most 1-step commands despite max multi-modal cuing.  Pt required total assist with bed mobility tasks and heavy assist to prevent posterior LOB upon coming to initial sitting position at the EOB.  Static sitting balance improved as the session progressed to where he was able to hold his position without assist with close SBA.  Multiple rocking attempts made to come to standing with +2 HHA with pt unable to clear the surface of the bed this session.  Pt is ambulatory facility distances at baseline and would benefit from a trial of therapy in a SNF setting but without significant functional and cognitive improvement would most likely be appropriate for LTC.      If plan is discharge home, recommend the following: Two people to help with walking and/or transfers;A lot of help with bathing/dressing/bathroom;Direct supervision/assist for medications management;Supervision due to cognitive status;Assist for transportation   Can travel by private vehicle     No  Equipment Recommendations  Other (comment) (TBD)    Recommendations for Other Services       Precautions / Restrictions Precautions Precautions: Fall Recall of Precautions/Restrictions: Impaired Restrictions Weight Bearing Restrictions Per Provider Order: No     Mobility  Bed Mobility Overal bed mobility: Needs Assistance Bed Mobility: Supine to Sit, Sit to Supine     Supine to sit: Total assist, +2 for  physical assistance, HOB elevated Sit to supine: Total assist, +2 for physical assistance   General bed mobility comments: Max multi modal cues for sequencing with pt putting forth little to no notable effort    Transfers Overall transfer level: Needs assistance Equipment used: 2 person hand held assist Transfers: Sit to/from Stand Sit to Stand: +2 physical assistance, Mod assist           General transfer comment: Max multi-modal cues to initiate movement with pt unable to clear the surface of the mattress this session    Ambulation/Gait               General Gait Details: Unable/unsafe to attempt   Stairs             Wheelchair Mobility     Tilt Bed    Modified Rankin (Stroke Patients Only)       Balance Overall balance assessment: Needs assistance Sitting-balance support: Feet supported Sitting balance-Leahy Scale: Poor Sitting balance - Comments: Assist to prevent posterior LOB initially but improved as the session progressed       Standing balance comment: unable to come to standing                            Communication Communication Communication: Impaired Factors Affecting Communication: Difficulty expressing self  Cognition Arousal: Lethargic Behavior During Therapy: WFL for tasks assessed/performed   PT - Cognitive impairments: No family/caregiver present to determine baseline, History of cognitive impairments                         Following  commands: Impaired Following commands impaired: Follows one step commands inconsistently    Cueing Cueing Techniques: Verbal cues, Gestural cues, Tactile cues, Visual cues  Exercises      General Comments        Pertinent Vitals/Pain Pain Assessment Pain Assessment: PAINAD Breathing: normal Negative Vocalization: none Facial Expression: smiling or inexpressive Body Language: tense, distressed pacing, fidgeting Consolability: distracted or reassured by  voice/touch PAINAD Score: 2 Pain Intervention(s): Monitored during session    Home Living                          Prior Function            PT Goals (current goals can now be found in the care plan section) Progress towards PT goals: Not progressing toward goals - comment (limited by cognition)    Frequency    Min 1X/week      PT Plan      Co-evaluation              AM-PAC PT "6 Clicks" Mobility   Outcome Measure  Help needed turning from your back to your side while in a flat bed without using bedrails?: Total Help needed moving from lying on your back to sitting on the side of a flat bed without using bedrails?: Total Help needed moving to and from a bed to a chair (including a wheelchair)?: Total Help needed standing up from a chair using your arms (e.g., wheelchair or bedside chair)?: Total Help needed to walk in hospital room?: Total Help needed climbing 3-5 steps with a railing? : Total 6 Click Score: 6    End of Session Equipment Utilized During Treatment: Gait belt Activity Tolerance: Patient tolerated treatment well Patient left: in bed;with bed alarm set;with nursing/sitter in room;with call bell/phone within reach Nurse Communication: Mobility status;Other (comment) (Pt favoring LUE during the session with some redness/swelling noted) PT Visit Diagnosis: Unsteadiness on feet (R26.81);Difficulty in walking, not elsewhere classified (R26.2);Muscle weakness (generalized) (M62.81)     Time: 9528-4132 PT Time Calculation (min) (ACUTE ONLY): 20 min  Charges:    $Therapeutic Activity: 8-22 mins PT General Charges $$ ACUTE PT VISIT: 1 Visit                     D. Scott Shyquan Stallbaumer PT, DPT 02/16/24, 1:54 PM

## 2024-02-16 NOTE — Plan of Care (Signed)
  Problem: Pain Managment: Goal: General experience of comfort will improve and/or be controlled Outcome: Progressing   Problem: Safety: Goal: Ability to remain free from injury will improve Outcome: Progressing   Problem: Skin Integrity: Goal: Risk for impaired skin integrity will decrease Outcome: Progressing

## 2024-02-16 NOTE — Progress Notes (Signed)
 Occupational Therapy Treatment Patient Details Name: Seth Thompson MRN: [id number] DOB: 06/25/1946 Today's Date: 02/16/2024   History of present illness Pt is a 78 year old male admitted after LOC in shower, admitted with acute metabolic encephalopathy, syncope in the setting of bradycardia, hypokalemia, incarcerated right sided inguinal hernia. PMH significant for essential hypertension, dementia, frequent PVCs, and hyperlipidemia.   OT comments  Pt seen for OT treatment on this date. Upon arrival to room, patient supine in bed with HOB elevated and Mits/soft restraints to BUEs, agreeable to tx. Pt requires Max A x 2 for supine to sit EOB.  At EOB, therapist facilitated static sitting, progressing to light dynamic sitting with light reaching outside of BOS in preparation for functional transfer training.  Facilitation of use of Dwana Gist for sit to stand transfer with patient demonstrating poor understanding of instruction for hand/foot placement with transfer attempts being unsuccessful with this DME.  Therapist facilitated sit to stand transfer after several attempts and significant cognitive cuing with patient requiring Mod A x1 for partial sit to stand transfer with patient not fully extending BLEs (was able to clear bottom off of bed).  Therapist facilitated scooting to move toward [facility] with patient initially requiring Max A however improving to Min/Mod A to scoot with significant cognitive cuing throughout, including visual/verbal/tactile cuing.  Max A x 1 required for sit to supine and bed repositioning.  Max A x 2 for rolling R/L due to patient's cognitive limitations in following cuing.  Total assistance for [facility].  At end of session, supine with HOB elevated.  Soft restraints in place and call button within reach.  Pt making good progress toward goals, will continue to follow POC. Discharge recommendation remains appropriate.        If plan is discharge home, recommend the following:   Two people to help with walking and/or transfers;Two people to help with bathing/dressing/bathroom;Supervision due to cognitive status   Equipment Recommendations       Recommendations for Other Services      Precautions / Restrictions Precautions Precautions: Fall Recall of Precautions/Restrictions: Impaired Restrictions Weight Bearing Restrictions Per Provider Order: No       Mobility Bed Mobility Overal bed mobility: Needs Assistance Bed Mobility: Supine to Sit, Sit to Supine, Rolling Rolling: +2 for physical assistance, Max assist   Supine to sit: Max assist, +2 for physical assistance Sit to supine: Max assist, +2 for physical assistance        Transfers Overall transfer level: Needs assistance Equipment used: 2 person hand held assist Dwana Gist attempted) Transfers: Sit to/from Stand Sit to Stand: +2 physical assistance, Mod assist             Transfer via Lift Equipment: Stedy   Balance Overall balance assessment: Needs assistance Sitting-balance support: Feet supported Sitting balance-Leahy Scale: Poor Sitting balance - Comments: with skilled cuing, patient demonstrated posterior lean with resistence with significant cuing and scaffolding to increase weight shifting forward in preparation for sit to stand transfers. Postural control: Posterior lean     Standing balance comment: After several attempts, patient Mod A x 1 for partial sit to stand (buttom cleared bed however BLEs not fully extended).                           ADL either performed or assessed with clinical judgement   ADL Overall ADL's : Needs assistance/impaired Eating/Feeding: Minimal assistance Eating/Feeding Details (indicate cue type and reason): drinking  water through cup with straw while seated EOB             Upper Body Dressing : Maximal assistance;Sitting;Cueing for sequencing Upper Body Dressing Details (indicate cue type and reason): doffed gown                    General ADL Comments: step by step multi modal cues for task initiation and participation    Extremity/Trunk Assessment              Vision       Perception     Praxis     Communication Communication Communication: Impaired Factors Affecting Communication: Difficulty expressing self   Cognition Arousal: Lethargic   Cognition: History of cognitive impairments, Cognition impaired   Orientation impairments: Place, Time, Situation   Memory impairment (select all impairments): Short-term memory, Working [employer] fast streamer, Non-declarative long-term memory, Armed forces training and education officer functioning impairment (select all impairments): Initiation, Organization, Sequencing, Reasoning, Problem solving OT - Cognition Comments: step by step multi modal cues for task initiation; pt is pleasant but only states name                 Following commands: Impaired Following commands impaired: Follows one step commands inconsistently      Cueing   Cueing Techniques: Verbal cues, Gestural cues, Tactile cues, Visual cues  Exercises Other Exercises Other Exercises: restraints and mitts doffed pre session/donned post session per protocol    Shoulder Instructions       General Comments      Pertinent Vitals/ Pain       Pain Assessment Pain Assessment: Faces Faces Pain Scale: Hurts even more (demonstrated discomfort/pain with touch to BLEs)  Home Living                                          Prior Functioning/Environment              Frequency  Min 1X/week        Progress Toward Goals  OT Goals(current goals can now be found in the care plan section)  Progress towards OT goals: Progressing toward goals  Acute Rehab OT Goals OT Goal Formulation: Patient unable to participate in goal setting Time For Goal Achievement: 02/27/24 Potential to Achieve Goals: Fair  Plan      Co-evaluation                 AM-PAC OT "6  Clicks" Daily Activity     Outcome Measure   Help from another person eating meals?: A Little Help from another person taking care of personal grooming?: A Lot Help from another person toileting, which includes using toliet, bedpan, or urinal?: Total Help from another person bathing (including washing, rinsing, drying)?: Total Help from another person to put on and taking off regular upper body clothing?: A Lot Help from another person to put on and taking off regular lower body clothing?: Total 6 Click Score: 10    End of Session Equipment Utilized During Treatment: Gait belt;Other (comment) Dwana Gist (unsuccessful for transfer))  OT Visit Diagnosis: Other abnormalities of gait and mobility (R26.89);Cognitive communication deficit (R41.841);Other symptoms and signs involving cognitive function   Activity Tolerance Patient tolerated treatment well   Patient Left in bed;with call bell/phone within reach;with bed alarm set;Other (comment) Restaurant manager, fast food in room)   Nurse Communication  Time: 1446-1530 OT Time Calculation (min): 44 min  Charges: OT General Charges $OT Visit: 1 Visit OT Treatments $Self Care/Home Management : 8-22 mins $Therapeutic Activity: 23-37 mins  Sherial Dimes OTR/L   Lucas Rushing 02/16/2024, 4:08 PM

## 2024-02-16 NOTE — Progress Notes (Signed)
 1200 Soft wrist restraints removed for trial  1400 Soft wrist restraint trial failed due to patient having increased agitation and puling off devices. Dr. Mariella Shore made aware.

## 2024-02-17 DIAGNOSIS — R001 Bradycardia, unspecified: Secondary | ICD-10-CM | POA: Diagnosis not present

## 2024-02-17 LAB — CBC WITH DIFFERENTIAL/PLATELET
Abs Immature Granulocytes: 0.02 10*3/uL (ref 0.00–0.07)
Basophils Absolute: 0 10*3/uL (ref 0.0–0.1)
Basophils Relative: 1 %
Eosinophils Absolute: 0.1 10*3/uL (ref 0.0–0.5)
Eosinophils Relative: 1 %
HCT: 51.5 % (ref 39.0–52.0)
Hemoglobin: 17.9 g/dL — ABNORMAL HIGH (ref 13.0–17.0)
Immature Granulocytes: 0 %
Lymphocytes Relative: 16 %
Lymphs Abs: 0.9 10*3/uL (ref 0.7–4.0)
MCH: 31.1 pg (ref 26.0–34.0)
MCHC: 34.8 g/dL (ref 30.0–36.0)
MCV: 89.4 fL (ref 80.0–100.0)
Monocytes Absolute: 0.4 10*3/uL (ref 0.1–1.0)
Monocytes Relative: 8 %
Neutro Abs: 4.1 10*3/uL (ref 1.7–7.7)
Neutrophils Relative %: 74 %
Platelets: 177 10*3/uL (ref 150–400)
RBC: 5.76 MIL/uL (ref 4.22–5.81)
RDW: 12.4 % (ref 11.5–15.5)
WBC: 5.6 10*3/uL (ref 4.0–10.5)
nRBC: 0 % (ref 0.0–0.2)

## 2024-02-17 LAB — BASIC METABOLIC PANEL WITH GFR
Anion gap: 9 (ref 5–15)
BUN: 22 mg/dL (ref 8–23)
CO2: 22 mmol/L (ref 22–32)
Calcium: 9.1 mg/dL (ref 8.9–10.3)
Chloride: 103 mmol/L (ref 98–111)
Creatinine, Ser: 1.04 mg/dL (ref 0.61–1.24)
GFR, Estimated: >60 mL/min (ref >60)
Glucose, Bld: 118 mg/dL — ABNORMAL HIGH (ref 70–99)
Potassium: 3.7 mmol/L (ref 3.5–5.1)
Sodium: 134 mmol/L — ABNORMAL LOW (ref 135–145)

## 2024-02-17 MED ORDER — LORAZEPAM 0.5 MG PO TABS
0.5000 mg | ORAL_TABLET | Freq: Once | ORAL | Status: AC
  Administered 2024-02-17: 0.5 mg via ORAL
  Filled 2024-02-17: qty 1

## 2024-02-17 MED ORDER — QUETIAPINE FUMARATE ER 50 MG PO TB24
50.0000 mg | ORAL_TABLET | Freq: Every day | ORAL | Status: DC
  Administered 2024-02-17 – 2024-03-12 (×25): 50 mg via ORAL
  Filled 2024-02-17 (×26): qty 1

## 2024-02-17 MED ORDER — LACTULOSE 10 GM/15ML PO SOLN
30.0000 g | Freq: Two times a day (BID) | ORAL | Status: DC | PRN
  Administered 2024-02-17: 30 g via ORAL
  Filled 2024-02-17: qty 60

## 2024-02-17 NOTE — Progress Notes (Signed)
 [facility] CLINIC CARDIOLOGY PROGRESS NOTE       Patient ID: Seth Thompson MRN: [id number] DOB/AGE: [birth year] 78 y.o.  Admit date: 02/11/2024 Referring Physician Dr. Alvenia Aus Primary Physician Donold Galla, [facility] Primary Cardiologist Kolwalksi (last seen 2022) Reason for Consultation bradycardia, syncope  HPI: Seth Thompson is a 78 y.o. male  with a past medical history of hypertension, sinus bradycardia, severe alzheimer dementia who presented to the ED on 02/11/2024 for syncope. Cardiology was consulted for further evaluation.   Interval History: -Patient with severe dementia, seen and examined this AM with nurse at bedside. -HR stable this AM in 70s. Overnight Tele showed no significant events. No significant bradycardia, no sustained HR < 50, no significant AVB or significant pauses. BP elevated this AM. -Electrolytes remain stable.  -Per TOC plan to be discharged to SNF  Review of systems complete and found to be negative unless listed above    Past Medical History:  Diagnosis Date   Benign essential HTN 02/03/2018   Dementia (HCC)    Diverticulitis of large intestine with abscess without bleeding 04/03/2018   Frequent PVCs 07/02/2016   Hyperlipidemia    Right inguinal hernia 04/01/2023    Past Surgical History:  Procedure Laterality Date   CHOLECYSTECTOMY     COLONOSCOPY WITH PROPOFOL  N/A 07/24/2023   Procedure: COLONOSCOPY WITH PROPOFOL ;  Surgeon: Luke Salaam, MD;  Location: [facility] ENDOSCOPY;  Service: Gastroenterology;  Laterality: N/A;   COLONOSCOPY WITH PROPOFOL  N/A 08/20/2023   Procedure: COLONOSCOPY WITH PROPOFOL ;  Surgeon: Luke Salaam, MD;  Location: [facility] ENDOSCOPY;  Service: Gastroenterology;  Laterality: N/A;    Medications Prior to Admission  Medication Sig Dispense Refill Last Dose/Taking   cyanocobalamin 1000 MCG tablet Take 1,000 mcg by mouth daily.   02/10/2024 at  5:36 AM   guaifenesin (ROBITUSSIN) 100 MG/5ML syrup Take 10 mLs by mouth every 4  (four) hours as needed for cough or congestion.   Taking As Needed   melatonin 5 MG TABS Take 5 mg by mouth at bedtime.   02/10/2024 at  5:38 PM   Multiple Vitamin (MULTIVITAMIN) capsule Take 1 capsule by mouth daily.   02/10/2024 at  5:36 AM   polyethylene glycol (MIRALAX  / GLYCOLAX ) 17 g packet Take 17 g by mouth daily as needed for mild constipation.   Taking As Needed   senna (SENOKOT) 8.6 MG TABS tablet Take 1 tablet by mouth 3 (three) times a week. Mondays, Wednesday, and Friday   02/09/2024   sertraline  (ZOLOFT ) 50 MG tablet Take 50 mg by mouth at bedtime.   02/10/2024 at  5:38 PM   simethicone (MYLICON) 125 MG chewable tablet Chew 125 mg by mouth every 6 (six) hours as needed (gas/burping).   Taking As Needed   Social History   Socioeconomic History   Marital status: Divorced    Spouse name: Not on file   Number of children: Not on file   Years of education: Not on file   Highest education level: Not on file  Occupational History   Not on file  Tobacco Use   Smoking status: Never    Passive exposure: Never   Smokeless tobacco: Never  Vaping Use   Vaping status: Never Used  Substance and Sexual Activity   Alcohol use: Not Currently   Drug use: Not Currently   Sexual activity: Not on file  Other Topics Concern   Not on file  Social History Narrative   Not on file   Social Drivers of Health  Financial Resource Strain: Not on file  Food Insecurity: No Food Insecurity (02/11/2024)   Hunger Vital Sign    Worried About Running Out of Food in the Last Year: Never true    Ran Out of Food in the Last Year: Never true  Transportation Needs: No Transportation Needs (02/11/2024)   PRAPARE - Administrator, Civil Service (Medical): No    Lack of Transportation (Non-Medical): No  Physical Activity: Not on file  Stress: Not on file  Social Connections: Socially Isolated (02/11/2024)   Social Connection and Isolation Panel [NHANES]    Frequency of Communication with Friends and  Family: Never    Frequency of Social Gatherings with Friends and Family: Twice a week    Attends Religious Services: Never    Database administrator or Organizations: No    Attends Banker Meetings: Never    Marital Status: Divorced  Catering manager Violence: Patient Unable To Answer (02/11/2024)   Humiliation, Afraid, Rape, and Kick questionnaire    Fear of Current or Ex-Partner: Patient unable to answer    Emotionally Abused: Patient unable to answer    Physically Abused: Patient unable to answer    Sexually Abused: Patient unable to answer    History reviewed. No pertinent family history.   Vitals:   02/16/24 0755 02/16/24 1944 02/17/24 0405 02/17/24 0800  BP: (!) 148/92 (!) 151/97 (!) 146/108 (!) 159/100  Pulse: (!) 57 66 81 75  Resp: 18 18 18    Temp: 98 F (36.7 C) 98.6 F (37 C) 98.3 F (36.8 C) 98.2 F (36.8 C)  TempSrc:   Oral Axillary  SpO2: 99% 99% 100% 99%  Weight:   78.3 kg     PHYSICAL EXAM General: Chronically ill appearing elderly male, well nourished, in no acute distress. HEENT: Normocephalic and atraumatic. Neck: No JVD.   Lungs: Normal respiratory effort on room air. Clear bilaterally to auscultation. No wheezes, crackles, rhonchi.  Heart: HRR, slower heart rate. Normal S1 and S2 without gallops or murmurs.  Abdomen: Non-distended appearing.  Msk: Normal strength and tone for age. Extremities: Warm and well perfused. No clubbing, cyanosis. No edema.   Labs: Basic Metabolic Panel: Recent Labs    02/16/24 0519 02/17/24 0424  NA 137 134*  K 3.6 3.7  CL 102 103  CO2 26 22  GLUCOSE 127* 118*  BUN 21 22  CREATININE 0.98 1.04  CALCIUM 9.1 9.1   Liver Function Tests: No results for input(s): "AST", "ALT", "ALKPHOS", "BILITOT", "PROT", "ALBUMIN" in the last 72 hours.  No results for input(s): "LIPASE", "AMYLASE" in the last 72 hours. CBC: Recent Labs    02/16/24 0519 02/17/24 0424  WBC 6.4 5.6  NEUTROABS 5.0 4.1  HGB 18.0* 17.9*   HCT 51.7 51.5  MCV 88.7 89.4  PLT 146* 177   Cardiac Enzymes: No results for input(s): "CKTOTAL", "CKMB", "CKMBINDEX", "TROPONINIHS" in the last 72 hours.  BNP: No results for input(s): "BNP" in the last 72 hours. D-Dimer: No results for input(s): "DDIMER" in the last 72 hours. Hemoglobin A1C: No results for input(s): "HGBA1C" in the last 72 hours. Fasting Lipid Panel: No results for input(s): "CHOL", "HDL", "LDLCALC", "TRIG", "CHOLHDL", "LDLDIRECT" in the last 72 hours. Thyroid  Function Tests: No results for input(s): "TSH", "T4TOTAL", "T3FREE", "THYROIDAB" in the last 72 hours.  Invalid input(s): "FREET3"  Anemia Panel: No results for input(s): "VITAMINB12", "FOLATE", "FERRITIN", "TIBC", "IRON", "RETICCTPCT" in the last 72 hours.    Radiology: DG Forearm  Left Result Date: 02/16/2024 CLINICAL DATA:  Left forearm pain and swelling, initial encounter EXAM: LEFT FOREARM - 2 VIEW COMPARISON:  None Available. FINDINGS: There is no evidence of fracture or other focal bone lesions. Soft tissues are unremarkable. IMPRESSION: No acute abnormality noted. Electronically Signed   By: Violeta Grey M.D.   On: 02/16/2024 22:26   DG Chest Port 1 View Result Date: 02/15/2024 CLINICAL DATA:  Cough. EXAM: PORTABLE CHEST 1 VIEW COMPARISON:  Remote radiograph 05/06/2013 FINDINGS: Lung volumes are low. The heart is prominent in size likely accentuated by technique. Mediastinal contours are normal. Patchy opacity in the medial right lung base. No pulmonary edema, pleural effusion or pneumothorax. IMPRESSION: Patchy opacity in the medial right lung base, suspicious for pneumonia. Electronically Signed   By: Chadwick Colonel M.D.   On: 02/15/2024 15:13   ECHOCARDIOGRAM COMPLETE Result Date: 02/12/2024    ECHOCARDIOGRAM REPORT   Patient Name:   LOGANN FLAM Date of Exam: 02/12/2024 Medical Rec #:  [id number]         Height:       70.0 in Accession #:    [id number]        Weight:       198.2 lb Date of  Birth:  [birth year]         BSA:          2.079 m Patient Age:    77 years          BP:           154/70 mmHg Patient Gender: M                 HR:           70 bpm. Exam Location:  [facility] Procedure: 2D Echo, Cardiac Doppler and Color Doppler (Both Spectral and Color            Flow Doppler were utilized during procedure). Indications:     Syncope R55  History:         Patient has no prior history of Echocardiogram examinations.                  Risk Factors:Hypertension and Dyslipidemia. Dementia.  Sonographer:     Broadus Canes Referring Phys:  [id number] Nathin Saran Diagnosing Phys: Joetta Mustache  Sonographer Comments: Technically challenging study due to limited acoustic windows, no parasternal window and no subcostal window. IMPRESSIONS  1. Left ventricular ejection fraction, by estimation, is 60 to 65%. The left ventricle has normal function. The left ventricle has no regional wall motion abnormalities. There is mild left ventricular hypertrophy. Left ventricular diastolic parameters were normal.  2. Right ventricular systolic function is normal. The right ventricular size is normal.  3. The mitral valve is normal in structure. No evidence of mitral valve regurgitation.  4. The aortic valve is normal in structure. Aortic valve regurgitation is not visualized. FINDINGS  Left Ventricle: Left ventricular ejection fraction, by estimation, is 60 to 65%. The left ventricle has normal function. The left ventricle has no regional wall motion abnormalities. The left ventricular internal cavity size was normal in size. There is  mild left ventricular hypertrophy. Left ventricular diastolic parameters were normal. Right Ventricle: The right ventricular size is normal. No increase in right ventricular wall thickness. Right ventricular systolic function is normal. Left Atrium: Left atrial size was normal in size. Right Atrium: Right atrial size was normal in size. Pericardium: There is no evidence of pericardial effusion.  Mitral  Valve: The mitral valve is normal in structure. No evidence of mitral valve regurgitation. Tricuspid Valve: The tricuspid valve is normal in structure. Tricuspid valve regurgitation is trivial. Aortic Valve: The aortic valve is normal in structure. Aortic valve regurgitation is not visualized. Aortic valve mean gradient measures 2.0 mmHg. Aortic valve peak gradient measures 4.4 mmHg. Aortic valve area, by VTI measures 3.53 cm. Pulmonic Valve: The pulmonic valve was not well visualized. Pulmonic valve regurgitation is not visualized. Aorta: The aortic root is normal in size and structure. IAS/Shunts: The interatrial septum was not well visualized.  LEFT VENTRICLE PLAX 2D LVIDd:         3.50 cm   Diastology LVIDs:         2.50 cm   LV e' medial:    8.38 cm/s LV PW:         1.00 cm   LV E/e' medial:  8.6 LV IVS:        1.10 cm   LV e' lateral:   11.10 cm/s LVOT diam:     2.20 cm   LV E/e' lateral: 6.5 LV SV:         73 LV SV Index:   35 LVOT Area:     3.80 cm  RIGHT VENTRICLE RV Basal diam:  3.00 cm RV Mid diam:    2.10 cm LEFT ATRIUM             Index        RIGHT ATRIUM           Index LA diam:        3.90 cm 1.88 cm/m   RA Area:     10.10 cm LA Vol (A2C):   36.4 ml 17.51 ml/m  RA Volume:   18.30 ml  8.80 ml/m LA Vol (A4C):   36.6 ml 17.60 ml/m LA Biplane Vol: 37.4 ml 17.99 ml/m  AORTIC VALVE AV Area (Vmax):    3.41 cm AV Area (Vmean):   3.65 cm AV Area (VTI):     3.53 cm AV Vmax:           105.00 cm/s AV Vmean:          72.200 cm/s AV VTI:            0.207 m AV Peak Grad:      4.4 mmHg AV Mean Grad:      2.0 mmHg LVOT Vmax:         94.20 cm/s LVOT Vmean:        69.400 cm/s LVOT VTI:          0.192 m LVOT/AV VTI ratio: 0.93  AORTA Ao Root diam: 3.50 cm MITRAL VALVE               TRICUSPID VALVE MV Area (PHT): 2.93 cm    TR Peak grad:   18.5 mmHg MV Decel Time: 259 msec    TR Vmax:        215.00 cm/s MV E velocity: 72.30 cm/s MV A velocity: 91.70 cm/s  SHUNTS MV E/A ratio:  0.79        Systemic VTI:   0.19 m                            Systemic Diam: 2.20 cm Joetta Mustache Electronically signed by Joetta Mustache Signature Date/Time: 02/12/2024/4:12:59 PM    Final    CT ABDOMEN PELVIS W CONTRAST Result Date:  02/11/2024 CLINICAL DATA:  Right inguinal hernia. Concern for bowel obstruction. EXAM: CT ABDOMEN AND PELVIS WITH CONTRAST TECHNIQUE: Multidetector CT imaging of the abdomen and pelvis was performed using the standard protocol following bolus administration of intravenous contrast. RADIATION DOSE REDUCTION: This exam was performed according to the departmental dose-optimization program which includes automated exposure control, adjustment of the mA and/or kV according to patient size and/or use of iterative reconstruction technique. CONTRAST:  OMNIPAQUE  IOHEXOL  300 MG/ML  SOLN COMPARISON:  CT abdomen pelvis dated 03/08/2023. FINDINGS: Lower chest: Bibasilar atelectasis/scarring. The visualized lung bases are otherwise clear. Coronary vascular calcification. No intra-abdominal free air or free fluid. Hepatobiliary: The liver is unremarkable. There is mild dilatation, post cholecystectomy. No retained calcified stone noted in the central CBD. Pancreas: Unremarkable. No pancreatic ductal dilatation or surrounding inflammatory changes. Spleen: Normal in size without focal abnormality. Adrenals/Urinary Tract: The adrenal glands unremarkable. There is a 2.5 cm right renal inferior pole cyst. There is a 4 mm nonobstructing left renal interpolar stone. There is no hydronephrosis on either side. There is symmetric enhancement and excretion of contrast by both kidneys. The visualized ureters and urinary bladder appear unremarkable. Stomach/Bowel: There is a large right inguinal hernia containing multiple loops of distal small bowel. There is no bowel obstruction. There is sigmoid diverticulosis. The appendix is normal. Vascular/Lymphatic: Mild aortoiliac atherosclerotic disease. The IVC is unremarkable. No portal  venous gas. There is no adenopathy. Reproductive: The prostate and seminal vesicles are grossly unremarkable. No pelvic mass. Other: None Musculoskeletal: Osteopenia with degenerative changes of the spine. No acute osseous pathology. IMPRESSION: 1. Large right inguinal hernia containing multiple loops of distal small bowel. No bowel obstruction. 2. Sigmoid diverticulosis. Normal appendix. 3. A 4 mm nonobstructing left renal interpolar stone. No hydronephrosis. 4.  Aortic Atherosclerosis (ICD10-I70.0). Electronically Signed   By: Angus Bark M.D.   On: 02/11/2024 13:56   US  Venous Img Lower Unilateral Right Result Date: 02/11/2024 CLINICAL DATA:  Right lower extremity edema EXAM: RIGHT LOWER EXTREMITY VENOUS DOPPLER ULTRASOUND TECHNIQUE: Gray-scale sonography with compression, as well as color and duplex ultrasound, were performed to evaluate the deep venous system(s) from the level of the common femoral vein through the popliteal and proximal calf veins. COMPARISON:  None Available. FINDINGS: VENOUS Normal compressibility of the common femoral, superficial femoral, and popliteal veins, as well as the visualized calf veins. Visualized portions of profunda femoral vein and great saphenous vein unremarkable. No filling defects to suggest DVT on grayscale or color Doppler imaging. Doppler waveforms show normal direction of venous flow, normal respiratory plasticity and response to augmentation. Limited views of the contralateral common femoral vein are unremarkable. OTHER Edema is present within the superficial soft tissues. Limitations: none IMPRESSION: 1. No evidence of right lower extremity DVT. Electronically Signed   By: Reagan Camera M.D.   On: 02/11/2024 08:43   CT Head Wo Contrast Result Date: 02/11/2024 CLINICAL DATA:  Minor head trauma EXAM: CT HEAD WITHOUT CONTRAST CT CERVICAL SPINE WITHOUT CONTRAST TECHNIQUE: Multidetector CT imaging of the head and cervical spine was performed following the standard  protocol without intravenous contrast. Multiplanar CT image reconstructions of the cervical spine were also generated. RADIATION DOSE REDUCTION: This exam was performed according to the departmental dose-optimization program which includes automated exposure control, adjustment of the mA and/or kV according to patient size and/or use of iterative reconstruction technique. COMPARISON:  04/14/2023 FINDINGS: CT HEAD FINDINGS Brain: No evidence of acute infarction, hemorrhage, hydrocephalus, extra-axial collection or mass lesion/mass effect.  Advanced and generalized cortical atrophy. Vascular: No hyperdense vessel or unexpected calcification. Skull: Normal. Negative for fracture or focal lesion. Sinuses/Orbits: No evidence of injury CT CERVICAL SPINE FINDINGS Alignment: Normal. Skull base and vertebrae: No acute fracture. No primary bone lesion or focal pathologic process. Soft tissues and spinal canal: No prevertebral fluid or swelling. No visible canal hematoma. Disc levels: Generalized degenerative disc space narrowing and endplate spurring. Upper chest: No evidence of injury IMPRESSION: No evidence of acute intracranial or cervical spine injury. Advanced cortical atrophy. Electronically Signed   By: Ronnette Coke M.D.   On: 02/11/2024 07:19   CT Cervical Spine Wo Contrast Result Date: 02/11/2024 CLINICAL DATA:  Minor head trauma EXAM: CT HEAD WITHOUT CONTRAST CT CERVICAL SPINE WITHOUT CONTRAST TECHNIQUE: Multidetector CT imaging of the head and cervical spine was performed following the standard protocol without intravenous contrast. Multiplanar CT image reconstructions of the cervical spine were also generated. RADIATION DOSE REDUCTION: This exam was performed according to the departmental dose-optimization program which includes automated exposure control, adjustment of the mA and/or kV according to patient size and/or use of iterative reconstruction technique. COMPARISON:  04/14/2023 FINDINGS: CT HEAD  FINDINGS Brain: No evidence of acute infarction, hemorrhage, hydrocephalus, extra-axial collection or mass lesion/mass effect. Advanced and generalized cortical atrophy. Vascular: No hyperdense vessel or unexpected calcification. Skull: Normal. Negative for fracture or focal lesion. Sinuses/Orbits: No evidence of injury CT CERVICAL SPINE FINDINGS Alignment: Normal. Skull base and vertebrae: No acute fracture. No primary bone lesion or focal pathologic process. Soft tissues and spinal canal: No prevertebral fluid or swelling. No visible canal hematoma. Disc levels: Generalized degenerative disc space narrowing and endplate spurring. Upper chest: No evidence of injury IMPRESSION: No evidence of acute intracranial or cervical spine injury. Advanced cortical atrophy. Electronically Signed   By: Ronnette Coke M.D.   On: 02/11/2024 07:19    ECHO as above  TELEMETRY reviewed by me 02/17/2024: sinus bradycardia, rate 80s (no significant events overnight on tele)  EKG reviewed by me: sinus bradycardia with short sinus pause, rate 58 bpm   Data reviewed by me 02/17/2024: last 24h vitals tele labs imaging I/O hospitalist progress note.  Principal Problem:   Bradycardia with less than 30 beats per minute Active Problems:   Incarcerated right inguinal hernia   Syncope   Alzheimer's disease (HCC)    ASSESSMENT AND PLAN:  Seth Thompson is a 78 y.o. male  with a past medical history of hypertension, sinus bradycardia, severe alzheimer dementia who presented to the ED on 02/11/2024 for syncope. Cardiology was consulted for further evaluation.   # Bradycardia # Syncope  # Alzheimer dementia  # Hypertension Patient with severe dementia reports to ED with bradycardia and a witnessed syncopal event. Trop neg x 1. EKG in ED and per tele patient sinus bradycardia with sinus pauses that are less than 3 seconds. Longest pause was 2.1 seconds on tele on 05/07. Per tele there has been no significant bradycardia, no  sustained HR < 50, no significant AVB or significant pauses since admission. Echo this admission EF 60-65%, no RWMA, no significant valvular disease. Over the weekend there was intermittent junctional and ventricular escape beats noted with rate in low 40s/high 30s, although not sustaining for long duration. Per tele overnight with no significant events. HR stable and BP elevated this AM.   -Continue amlodipine  to 10 mg daily  -Avoid AVN blockers due to patients bradycardia. -No clear indication for PPM at this time as patient has no  evidence of high grade AV block and no evidence of sinus pause longer than 3 seconds, especially with patients severe dementia.  -Will not place holter due to patients baseline advanced dementia.  -Per TOC plan to discharge to SNF.  Cardiology will sign off. Please haiku with questions or re-engage if needed.   This patient's plan of care was discussed and created with Dr. Custovic and she is in agreement.  Signed: Creighton Doffing, PA-C  02/17/2024, 9:43 AM [facility] Cardiology

## 2024-02-17 NOTE — Plan of Care (Signed)

## 2024-02-17 NOTE — Progress Notes (Signed)
 Progress Note   Patient: Seth Thompson ZOX:[id number] DOB: 19-Mar-1946 DOA: 02/11/2024     6 DOS: the patient was seen and examined on 02/17/2024    Brief hospital course: From HPI "Seth Thompson is a 78 y.o. male with medical history significant of essential hypertension, dementia, frequent PVCs, hyperlipidemia who was brought in from a facility after the patient lost consciousness while sitting on the shower chair at the facility.  At the time patient was being seen he was pleasantly confused and so unable to contribute to history.  History was obtained from chart review as well as discussion with the ED physician Upon arrival to the emergency room patient was found to be bradycardic in the 50s and had a drop of his pulse into the 20s.  Hospitalist service was therefore contacted to admit patient for further management.  ED physician also spoke with cardiologist who will be seeing patient in consultation.  Patient also found to have incidental right inguinal hernia that was irreducible and surgeon was consulted however no acute surgical intervention recommended at this time "   Assessment and Plan:   Acute metabolic encephalopathy in a patient with underlying dementia. Possibly secondary to worsening dementia CT scan of the brain did not show any acute intracranial pathology Patient still confused likely progressive dementia Continue delirium precaution Monitor neurochecks closely Patient currently requiring one-to-one sitter Discontinue one-to-one sitter if able to allow placement Normal TSH, B12 as well as folic acid levels  Community-acquired pneumonia Chest x-ray showing findings of infiltrate Continue Augmentin  Monitor respiratory function closely  Syncope in the setting of bradycardia-improved Upon arrival to the emergency room patient was found to be bradycardic in the 50s and had a drop of his pulse into the 20s Cardiologist on board and case discussed Echo showed EF 60  to 65% with normal diastolic parameters I have discussed the case with cardiology team At this point no permanent pacemaker placement recommended Cardiologist have signed off on 02/17/2024     Hypokalemia-continue repletion and monitoring   Incarcerated right sided inguinal hernia I have discussed the case with general surgery No surgical intervention recommended at this time Continue as needed pain medication   Essential hypertension, Avoid AV nodal blocking agents Other antihypertensives on hold as recommended by cardiology Patient placed on as needed hydralazine  for systolic blood pressure greater than 170   Severe dementia with behavioral disturbances Continue delirium precaution Continue one-to-one sitter   Hyperlipidemia-does not appear to be on statin   DVT prophylaxis-continue Lovenox     Advance Care Planning:   Code Status: Prior full code   Consults: Cardiology, general surgery   Family Communication: None present at bedside     Subjective:  Patient seen and examined at bedside this morning Unable to obtain any subjective information given patient's advanced dementia   Physical Exam:  General: He is not in acute distress.  Pleasantly confused    Appearance: He is not ill-appearing.  Cardiovascular:     Rate and Rhythm: Normal rate and regular rhythm.     Heart sounds: Normal heart sounds.  Pulmonary:     Effort: Pulmonary effort is normal. No accessory muscle usage.  Neurological:     General: No focal deficit present.  Confused but moving all extremities equally Psychiatric:    Confused Abdomen: Full nondistended, tenderness noted to the right inguinal area, right-sided inguinal hernia noted   Data Reviewed:    Latest Ref Rng & Units 02/17/2024    4:24 AM 02/16/2024  5:19 AM 02/15/2024    4:43 AM  BMP  Glucose 70 - 99 mg/dL 191  478  295   BUN 8 - 23 mg/dL 22  21  21    Creatinine 0.61 - 1.24 mg/dL 6.21  3.08  6.57   Sodium 135 - 145 mmol/L 134   137  138   Potassium 3.5 - 5.1 mmol/L 3.7  3.6  3.7   Chloride 98 - 111 mmol/L 103  102  100   CO2 22 - 32 mmol/L 22  26  24    Calcium 8.9 - 10.3 mg/dL 9.1  9.1  9.2      Vitals:   02/16/24 1944 02/17/24 0405 02/17/24 0800 02/17/24 1150  BP: (!) 151/97 (!) 146/108 (!) 159/100 (!) 162/94  Pulse: 66 81 75 69  Resp: 18 18  17   Temp: 98.6 F (37 C) 98.3 F (36.8 C) 98.2 F (36.8 C) (!) 97.5 F (36.4 C)  TempSrc:  Oral Axillary   SpO2: 99% 100% 99% 100%  Weight:  78.3 kg        Latest Ref Rng & Units 02/17/2024    4:24 AM 02/16/2024    5:19 AM 02/15/2024    4:43 AM  CBC  WBC 4.0 - 10.5 K/uL 5.6  6.4  7.8   Hemoglobin 13.0 - 17.0 g/dL 84.6  96.2  95.2   Hematocrit 39.0 - 52.0 % 51.5  51.7  51.0   Platelets 150 - 400 K/uL 177  146  167       Family Communication: Discussed with family over the phone yesterday  Disposition: Unsafe discharge, patient still requiring one-to-one observation  Time spent: 39 minutes  Author: Ezzard Holms, MD 02/17/2024 4:32 PM  For on call review [email or web address].

## 2024-02-17 NOTE — Progress Notes (Signed)
 At shift change, this RN noticed patient's left arm was swelling, red and warm to touch. On-call notified. Restraint d/c, X-ray done but no further order given. We continue to monitor.

## 2024-02-17 NOTE — Plan of Care (Signed)
?  Problem: Safety: ?Goal: Ability to remain free from injury will improve ?Outcome: Progressing ?  ?Problem: Skin Integrity: ?Goal: Risk for impaired skin integrity will decrease ?Outcome: Progressing ?  ?Problem: Safety: ?Goal: Non-violent Restraint(s) ?Outcome: Progressing ?  ?

## 2024-02-17 NOTE — Plan of Care (Signed)
   Problem: Coping: Goal: Level of anxiety will decrease Outcome: Progressing   Problem: Pain Managment: Goal: General experience of comfort will improve and/or be controlled Outcome: Progressing

## 2024-02-18 DIAGNOSIS — J189 Pneumonia, unspecified organism: Secondary | ICD-10-CM

## 2024-02-18 DIAGNOSIS — R001 Bradycardia, unspecified: Secondary | ICD-10-CM | POA: Diagnosis not present

## 2024-02-18 DIAGNOSIS — G309 Alzheimer's disease, unspecified: Secondary | ICD-10-CM | POA: Diagnosis not present

## 2024-02-18 DIAGNOSIS — K4030 Unilateral inguinal hernia, with obstruction, without gangrene, not specified as recurrent: Secondary | ICD-10-CM | POA: Diagnosis not present

## 2024-02-18 LAB — CBC WITH DIFFERENTIAL/PLATELET
Abs Immature Granulocytes: 0.01 10*3/uL (ref 0.00–0.07)
Basophils Absolute: 0 10*3/uL (ref 0.0–0.1)
Basophils Relative: 0 %
Eosinophils Absolute: 0.1 10*3/uL (ref 0.0–0.5)
Eosinophils Relative: 1 %
HCT: 49.6 % (ref 39.0–52.0)
Hemoglobin: 17.3 g/dL — ABNORMAL HIGH (ref 13.0–17.0)
Immature Granulocytes: 0 %
Lymphocytes Relative: 16 %
Lymphs Abs: 0.9 10*3/uL (ref 0.7–4.0)
MCH: 31.2 pg (ref 26.0–34.0)
MCHC: 34.9 g/dL (ref 30.0–36.0)
MCV: 89.4 fL (ref 80.0–100.0)
Monocytes Absolute: 0.5 10*3/uL (ref 0.1–1.0)
Monocytes Relative: 8 %
Neutro Abs: 4.1 10*3/uL (ref 1.7–7.7)
Neutrophils Relative %: 75 %
Platelets: 155 10*3/uL (ref 150–400)
RBC: 5.55 MIL/uL (ref 4.22–5.81)
RDW: 12.4 % (ref 11.5–15.5)
WBC: 5.6 10*3/uL (ref 4.0–10.5)
nRBC: 0 % (ref 0.0–0.2)

## 2024-02-18 LAB — BASIC METABOLIC PANEL WITH GFR
Anion gap: 5 (ref 5–15)
BUN: 21 mg/dL (ref 8–23)
CO2: 26 mmol/L (ref 22–32)
Calcium: 9.1 mg/dL (ref 8.9–10.3)
Chloride: 105 mmol/L (ref 98–111)
Creatinine, Ser: 0.89 mg/dL (ref 0.61–1.24)
GFR, Estimated: >60 mL/min (ref >60)
Glucose, Bld: 125 mg/dL — ABNORMAL HIGH (ref 70–99)
Potassium: 3.7 mmol/L (ref 3.5–5.1)
Sodium: 136 mmol/L (ref 135–145)

## 2024-02-18 NOTE — TOC Progression Note (Signed)
 Transition of Care [facility]) - Progression Note    Patient Details  Name: Seth Thompson MRN: [id number] Date of Birth: June 27, 1946  [facility]) CM/SW Contact  Joslyn Nim, RN Phone Number: 02/18/2024, 11:34 AM  Clinical Narrative:    Cm received a voice message this morning from insurance company. Insurance approved ambulance transportation with auth # [id number]. Insurance denied SNF but did request peer to peer to be completed by 4pm today. Dr. Herbie Loll [phone] is the MD for the insurance company. CM placed information in secure chat for Dr. Butch Cashing and CM spoke with him on the unit about the peer to peer.    Expected Discharge Plan: Skilled Nursing Facility Barriers to Discharge: Continued Medical Work up  Expected Discharge Plan and Services       Living arrangements for the past 2 months: Assisted Living Facility                                       Social Determinants of Health (SDOH) Interventions SDOH Screenings   Food Insecurity: No Food Insecurity (02/11/2024)  Housing: Low Risk  (02/11/2024)  Transportation Needs: No Transportation Needs (02/11/2024)  Utilities: Not At Risk (02/11/2024)  Social Connections: Socially Isolated (02/11/2024)  Tobacco Use: Low Risk  (02/12/2024)    Readmission Risk Interventions     No data to display

## 2024-02-18 NOTE — Progress Notes (Signed)
 Physical Therapy Treatment Patient Details Name: Seth Thompson MRN: [id number] DOB: 10-10-1945 Today's Date: 02/18/2024   History of Present Illness Pt is a 78 year old male admitted after LOC in shower, admitted with acute metabolic encephalopathy, syncope in the setting of bradycardia, hypokalemia, incarcerated right sided inguinal hernia. PMH significant for essential hypertension, dementia, frequent PVCs, and hyperlipidemia.    PT Comments  Pt lethargic but easily awakens with min verbal and tactile cues.  Pt required total assist with rolling left/right and sup to/from sit despite max cuing for sequencing.  Upon coming to sitting at the EOB pt required heavy assist to prevent posterior LOB initially but static sitting balance did improve after anterior weight shifting activities.  Dwana Gist lift utilized during sit to stand transfer training with pt able to clear the surface of the bed three times this session, each time remaining in partial standing position for around 15-20 sec.  Despite heavy assist and max cuing pt unable to come to full upright standing position.  Pt will benefit from a trial of continued PT services upon discharge to safely address deficits listed in patient problem list for decreased caregiver assistance and eventual return to PLOF.     If plan is discharge home, recommend the following: Two people to help with walking and/or transfers;A lot of help with bathing/dressing/bathroom;Direct supervision/assist for medications management;Supervision due to cognitive status;Assist for transportation   Can travel by private vehicle     No  Equipment Recommendations  Other (comment) (TBD)    Recommendations for Other Services       Precautions / Restrictions Precautions Precautions: Fall Recall of Precautions/Restrictions: Impaired Restrictions Weight Bearing Restrictions Per Provider Order: No     Mobility  Bed Mobility Overal bed mobility: Needs  Assistance Bed Mobility: Supine to Sit, Sit to Supine, Rolling Rolling: +2 for physical assistance, Max assist   Supine to sit: Max assist, +2 for physical assistance Sit to supine: Max assist, +2 for physical assistance   General bed mobility comments: Max multi modal cues for sequencing with pt putting forth little to no notable effort    Transfers Overall transfer level: Needs assistance   Transfers: Sit to/from Stand Sit to Stand: +2 physical assistance, Mod assist, Max assist           General transfer comment: Max multi-modal cues to initiate movement with pt able to clear the surface of the mattress on three occasions this session but unable to come to full upright position Transfer via Lift Equipment: Stedy  Ambulation/Gait               General Gait Details: Unable/unsafe to attempt   Stairs             Wheelchair Mobility     Tilt Bed    Modified Rankin (Stroke Patients Only)       Balance Overall balance assessment: Needs assistance Sitting-balance support: Feet supported Sitting balance-Leahy Scale: Poor Sitting balance - Comments: Mod to max multi-modal cues for anterior weight shifting to address posterior instability Postural control: Posterior lean Standing balance support: Bilateral upper extremity supported, Reliant on assistive device for balance Standing balance-Leahy Scale: Zero                              Communication Communication Communication: Impaired Factors Affecting Communication: Difficulty expressing self  Cognition Arousal: Lethargic Behavior During Therapy: Anxious   PT - Cognitive impairments: No family/caregiver  present to determine baseline, History of cognitive impairments                         Following commands: Impaired Following commands impaired: Follows one step commands inconsistently    Cueing Cueing Techniques: Verbal cues, Gestural cues, Tactile cues, Visual cues   Exercises      General Comments        Pertinent Vitals/Pain Pain Assessment Pain Assessment: PAINAD Breathing: normal Negative Vocalization: occasional moan/groan, low speech, negative/disapproving quality Facial Expression: sad, frightened, frown Body Language: tense, distressed pacing, fidgeting Consolability: distracted or reassured by voice/touch PAINAD Score: 4 Pain Intervention(s): Monitored during session, Repositioned    Home Living                          Prior Function            PT Goals (current goals can now be found in the care plan section) Progress towards PT goals: Not progressing toward goals - comment (limited by cognition)    Frequency    Min 1X/week      PT Plan      Co-evaluation              AM-PAC PT "6 Clicks" Mobility   Outcome Measure  Help needed turning from your back to your side while in a flat bed without using bedrails?: Total Help needed moving from lying on your back to sitting on the side of a flat bed without using bedrails?: Total Help needed moving to and from a bed to a chair (including a wheelchair)?: Total Help needed standing up from a chair using your arms (e.g., wheelchair or bedside chair)?: Total Help needed to walk in hospital room?: Total Help needed climbing 3-5 steps with a railing? : Total 6 Click Score: 6    End of Session Equipment Utilized During Treatment: Gait belt Activity Tolerance: Patient tolerated treatment well Patient left: in bed;with bed alarm set;with nursing/sitter in room;with call bell/phone within reach Nurse Communication: Mobility status PT Visit Diagnosis: Unsteadiness on feet (R26.81);Difficulty in walking, not elsewhere classified (R26.2);Muscle weakness (generalized) (M62.81)     Time: 1610-9604 PT Time Calculation (min) (ACUTE ONLY): 23 min  Charges:    $Therapeutic Activity: 23-37 mins PT General Charges $$ ACUTE PT VISIT: 1 Visit                      D. Scott Mida Cory PT, DPT 02/18/24, 11:48 AM

## 2024-02-18 NOTE — Progress Notes (Signed)
 Progress Note   Patient: Seth Thompson LKG:[id number] DOB: 1946/06/25 DOA: 02/11/2024     7 DOS: the patient was seen and examined on 02/18/2024   Brief hospital course: Seth Thompson is a 78 y.o. male with medical history significant of essential hypertension, dementia, frequent PVCs, hyperlipidemia who was brought in from a facility after the patient lost consciousness while sitting on the shower chair at the facility.  He is admitted for further management and evaluation of altered mental status..   Assessment and Plan: Acute metabolic encephalopathy in a patient with underlying dementia. Possibly secondary to worsening dementia CT scan of the brain unremarkable. Patient still confused likely progressive dementia in the setting of pneumonia Continue delirium precautions. Monitor neurochecks closely Patient currently requiring one-to-one sitter Normal TSH, B12 as well as folic acid levels. Patient still requiring sitter at bedside which will make placement challenging.   Community-acquired pneumonia Chest x-ray showing findings of infiltrate Continue Augmentin  for total 5 days. Continue to monitor pulse ox.  Syncope in the setting of bradycardia-improved Upon arrival to the emergency room patient was found to be bradycardic in the 50s and had a drop of his pulse into the 20s. Echo showed EF 60 to 65% with normal diastolic parameters. Telemetry unremarkable. Avoid AV nodal blocking agents. Cardiologist advised no intervention of permanent pacemaker at this time.   Hypokalemia- Potassium stable.    Incarcerated right sided inguinal hernia No surgical intervention recommended by surgery team. Continue as needed pain medication   Essential hypertension, Avoid AV nodal blocking agents Other antihypertensives on hold as recommended by cardiology   Severe dementia with behavioral disturbances Continue delirium precaution Continue one-to-one sitter   Hyperlipidemia-does  not appear to be on statin      Out of bed to chair. Incentive spirometry. Nursing supportive care. Fall, aspiration precautions. Diet:  Diet Orders (From admission, onward)     Start     Ordered   02/11/24 1139  Diet Heart Room service appropriate? Yes; Fluid consistency: Thin  Diet effective now       Question Answer Comment  Room service appropriate? Yes   Fluid consistency: Thin      02/11/24 1140           DVT prophylaxis: enoxaparin  (LOVENOX ) injection 40 mg Start: 02/11/24 2200  Level of care: Med-Surg   Code Status: Limited: Do not attempt resuscitation (DNR) -DNR-LIMITED -Do Not Intubate/DNI   Subjective: Patient is seen and examined today morning. He is poor historian. Sitter at bedside. He is eating fair.   Physical Exam: Vitals:   02/17/24 1150 02/17/24 2011 02/18/24 0355 02/18/24 0805  BP: (!) 162/94 (!) 129/96 125/85 (!) 156/71  Pulse: 69 64 (!) 59 66  Resp: 17 16 16 17   Temp: (!) 97.5 F (36.4 C) 98.6 F (37 C) (!) 96 F (35.6 C) (!) 97.5 F (36.4 C)  TempSrc:  Oral Axillary   SpO2: 100% 98% 99% 100%  Weight:        General - Elderly Caucasian male, no apparent distress HEENT - PERRLA, EOMI, atraumatic head, non tender sinuses. Lung - Clear, diffuse rhonchi, no wheezes. Heart - S1, S2 heard, no murmurs, rubs, no pedal edema. Abdomen - Soft, non tender, bowel sounds good Neuro - sleeping, not able to answer, non focal exam. Skin - Warm and dry.  Data Reviewed:      Latest Ref Rng & Units 02/18/2024    5:14 AM 02/17/2024    4:24 AM 02/16/2024  5:19 AM  CBC  WBC 4.0 - 10.5 K/uL 5.6  5.6  6.4   Hemoglobin 13.0 - 17.0 g/dL 65.7  84.6  96.2   Hematocrit 39.0 - 52.0 % 49.6  51.5  51.7   Platelets 150 - 400 K/uL 155  177  146       Latest Ref Rng & Units 02/18/2024    5:14 AM 02/17/2024    4:24 AM 02/16/2024    5:19 AM  BMP  Glucose 70 - 99 mg/dL 952  841  324   BUN 8 - 23 mg/dL 21  22  21    Creatinine 0.61 - 1.24 mg/dL 4.01  0.27  2.53    Sodium 135 - 145 mmol/L 136  134  137   Potassium 3.5 - 5.1 mmol/L 3.7  3.7  3.6   Chloride 98 - 111 mmol/L 105  103  102   CO2 22 - 32 mmol/L 26  22  26    Calcium 8.9 - 10.3 mg/dL 9.1  9.1  9.1    DG Forearm Left Result Date: 02/16/2024 CLINICAL DATA:  Left forearm pain and swelling, initial encounter EXAM: LEFT FOREARM - 2 VIEW COMPARISON:  None Available. FINDINGS: There is no evidence of fracture or other focal bone lesions. Soft tissues are unremarkable. IMPRESSION: No acute abnormality noted. Electronically Signed   By: Violeta Grey M.D.   On: 02/16/2024 22:26    Disposition: Status is: Inpatient Remains inpatient appropriate because: safe discharge plan  Planned Discharge Destination: Barriers to discharge: need family/ guardianship for long term care.     Time spent: 40 minutes  Author: Aisha Hove, MD 02/18/2024 11:41 AM Secure chat 7am to 7pm For on call review [email or web address].

## 2024-02-18 NOTE — TOC Progression Note (Signed)
 Transition of Care [facility]) - Progression Note    Patient Details  Name: Seth Thompson MRN: [id number] Date of Birth: [birth year]  [facility] CM/SW Contact  Joslyn Nim, RN Phone Number: 02/18/2024, 11:45 AM  Clinical Narrative:    Dr. Butch Cashing states that insurance upheld the denial for SNF and stated that patient will need long term placement. CM called patient's DSS guardian [name] [phone] in [place].  CM updated her on the denial. She states that she will have to reach out to her supervisor for the next step. CM has updated Dr. Ananias Karma.    Expected Discharge Plan: Skilled Nursing Facility Barriers to Discharge: Continued Medical Work up  Expected Discharge Plan and Services       Living arrangements for the past 2 months: Assisted Living Facility                                       Social Determinants of Health (SDOH) Interventions SDOH Screenings   Food Insecurity: No Food Insecurity (02/11/2024)  Housing: Low Risk  (02/11/2024)  Transportation Needs: No Transportation Needs (02/11/2024)  Utilities: Not At Risk (02/11/2024)  Social Connections: Socially Isolated (02/11/2024)  Tobacco Use: Low Risk  (02/12/2024)    Readmission Risk Interventions     No data to display

## 2024-02-19 DIAGNOSIS — R001 Bradycardia, unspecified: Secondary | ICD-10-CM | POA: Diagnosis not present

## 2024-02-19 NOTE — Plan of Care (Signed)
  Problem: Nutrition: Goal: Adequate nutrition will be maintained Outcome: Progressing   Problem: Coping: Goal: Level of anxiety will decrease Outcome: Progressing   Problem: Pain Managment: Goal: General experience of comfort will improve and/or be controlled Outcome: Progressing   Problem: Safety: Goal: Ability to remain free from injury will improve Outcome: Progressing

## 2024-02-19 NOTE — Plan of Care (Signed)
 ?  Problem: Coping: ?Goal: Level of anxiety will decrease ?Outcome: Progressing ?  ?Problem: Safety: ?Goal: Ability to remain free from injury will improve ?Outcome: Progressing ?  ?

## 2024-02-19 NOTE — TOC Progression Note (Addendum)
 Transition of Care [facility]) - Progression Note    Patient Details  Name: DARIEON SCHAAL MRN: [id number] Date of Birth: May 19, 1946  [facility]) CM/SW Contact  Loman Risk, RN Phone Number: 02/19/2024, 9:36 AM  Clinical Narrative:     CM called patient's DSS guardian [name] [phone] in [place]  requesting a return call for an update  1247 Spoke with Amparo Balk at [employer].  [employer] emailed [name] denial letter.  She is going to reach out Laporcha at DSS to check the status of program change, and have Laporcha reach out to me  Expected Discharge Plan: Skilled Nursing Facility Barriers to Discharge: Continued Medical Work up  Expected Discharge Plan and Services       Living arrangements for the past 2 months: Assisted Living Facility                                       Social Determinants of Health (SDOH) Interventions SDOH Screenings   Food Insecurity: No Food Insecurity (02/11/2024)  Housing: Low Risk  (02/11/2024)  Transportation Needs: No Transportation Needs (02/11/2024)  Utilities: Not At Risk (02/11/2024)  Social Connections: Socially Isolated (02/11/2024)  Tobacco Use: Low Risk  (02/12/2024)    Readmission Risk Interventions     No data to display

## 2024-02-19 NOTE — Progress Notes (Signed)
 Progress Note   Patient: Seth Thompson WUJ:[id number] DOB: [birth year] DOA: 02/11/2024     8 DOS: the patient was seen and examined on 02/19/2024   Brief hospital course:  "HEYDEN SVEUM is a 78 y.o. male with medical history significant of essential hypertension, dementia, frequent PVCs, hyperlipidemia who was brought in from a facility after the patient lost consciousness while sitting on the shower chair at the facility.  He is admitted for further management and evaluation of altered mental status.. "  Further hospital course and management as outlined below.   Assessment and Plan:  Acute metabolic encephalopathy due to pneumonia in a patient with underlying dementia.  Suspect may be showing progression of dementia CT scan of the brain unremarkable. Patient still confused likely progressive dementia in the setting of pneumonia Normal TSH, B12 as well as folic acid levels. --Continue delirium precautions. --Monitor neurochecks closely --Secondary school teacher for safety as needed Patient still requiring sitter at bedside which will make placement challenging.   Community-acquired pneumonia Chest x-ray showing findings of infiltrate --Continue Augmentin  for total 5 days. --Continue to monitor pulse ox.  Syncope in the setting of bradycardia-improved Upon arrival to the emergency room patient was found to be bradycardic in the 50s and had a drop of his pulse into the 20s. Echo showed EF 60 to 65% with normal diastolic parameters. Telemetry unremarkable. Avoid AV nodal blocking agents. Cardiologist advised no intervention of permanent pacemaker at this time.   Hypokalemia- resolved with replacement Monitor BMP   Incarcerated right sided inguinal hernia No surgical intervention recommended by surgery team. Continue as needed pain medication   Essential hypertension, Avoid AV nodal blocking agents Other antihypertensives on hold as recommended by cardiology   Severe  dementia with behavioral disturbances Continue delirium precaution Continue one-to-one sitter   Hyperlipidemia-does not appear to be on statin      Subjective: Pt seen with sitter at bedside this AM.  No acute events reported.  Due to dementia, pt not able to express any complaints or needs for me.  Physical Exam: Vitals:   02/19/24 0332 02/19/24 0355 02/19/24 0808 02/19/24 1237  BP: (!) 179/92 (!) 132/105 130/81 (!) 167/76  Pulse: (!) 59 (!) 57 64 (!) 57  Resp: 16  18   Temp: 97.9 F (36.6 C)  97.6 F (36.4 C) 97.6 F (36.4 C)  TempSrc: Axillary     SpO2: 99%  99% 99%  Weight:       General exam: awake, alert, no acute distress, confused but calm HEENT: atraumatic, clear conjunctiva, anicteric sclera, moist mucus membranes, hearing grossly normal  Respiratory system: CTA, no wheezes, rales or rhonchi, normal respiratory effort. Cardiovascular system: normal S1/S2, RRR, no pedal edema.   Gastrointestinal system: soft, NT, ND, no HSM felt, +bowel sounds. Central nervous system: exam limited by dementia with inability to follow commands, no gross focal neurologic deficits, normal speech Extremities: moves all, no edema, normal tone Skin: dry, intact, normal temperature Psychiatry: normal mood, congruent affect, abnormal judgement and insight due to dementia   Data Reviewed:  Notable labs --   Glucose 125, Hbg 17.3   Family Communication: None present. Will attempt to call as time allows.  No new medical updates to provide at this time.  Pt is stable and improved, awaiting placement.  Disposition: Status is: Inpatient Remains inpatient appropriate because: unsafe d/c, awaiting placement   Planned Discharge Destination: Skilled nursing facility    Time spent: 35 minutes  Author: Montey Apa,  DO 02/19/2024 1:14 PM  For on call review [email or web address].

## 2024-02-19 NOTE — TOC Progression Note (Signed)
 Transition of Care [facility]) - Progression Note    Patient Details  Name: Seth Thompson MRN: [id number] Date of Birth: December 01, 1945  Transition of [facility]) CM/SW Contact  Loman Risk, RN Phone Number: 02/19/2024, 4:55 PM  Clinical Narrative:     Farris Hong at [facility]. She is going to come assess patient tomorrow to determine if they can meet patients needs with hospice services  Spoke with Fredrik Jensen at [facility].  I provided her Liesa Tsan's Contact information at [facility] so they can discuss admission private pay for 1 month medicaid pending  Expected Discharge Plan: Skilled Nursing Facility Barriers to Discharge: Continued Medical Work up  Expected Discharge Plan and Services       Living arrangements for the past 2 months: Assisted Living Facility                                       Social Determinants of Health (SDOH) Interventions SDOH Screenings   Food Insecurity: No Food Insecurity (02/11/2024)  Housing: Low Risk  (02/11/2024)  Transportation Needs: No Transportation Needs (02/11/2024)  Utilities: Not At Risk (02/11/2024)  Social Connections: Socially Isolated (02/11/2024)  Tobacco Use: Low Risk  (02/12/2024)    Readmission Risk Interventions     No data to display

## 2024-02-20 DIAGNOSIS — R001 Bradycardia, unspecified: Secondary | ICD-10-CM | POA: Diagnosis not present

## 2024-02-20 MED ORDER — LOSARTAN POTASSIUM 25 MG PO TABS
25.0000 mg | ORAL_TABLET | Freq: Every day | ORAL | Status: DC
  Administered 2024-02-20 – 2024-04-02 (×42): 25 mg via ORAL
  Filled 2024-02-20 (×43): qty 1

## 2024-02-20 NOTE — TOC Progression Note (Signed)
 Transition of Care [facility]) - Progression Note    Patient Details  Name: Seth Thompson MRN: [id number] Date of Birth: 07/08/1946  Transition of Care [facility]) CM/SW Contact  Loman Risk, RN Phone Number: 02/20/2024, 11:27 AM  Clinical Narrative:     Per Lyndy Santos at [facility] she will be at the hospital around 2 to assess if they can meet patient's needs with hospice   Expected Discharge Plan: Skilled Nursing Facility Barriers to Discharge: Continued Medical Work up  Expected Discharge Plan and Services       Living arrangements for the past 2 months: Assisted Living Facility                                       Social Determinants of Health (SDOH) Interventions SDOH Screenings   Food Insecurity: No Food Insecurity (02/11/2024)  Housing: Low Risk  (02/11/2024)  Transportation Needs: No Transportation Needs (02/11/2024)  Utilities: Not At Risk (02/11/2024)  Social Connections: Socially Isolated (02/11/2024)  Tobacco Use: Low Risk  (02/12/2024)    Readmission Risk Interventions     No data to display

## 2024-02-20 NOTE — Progress Notes (Signed)
  Progress Note   Patient: Seth Thompson ZOX:[id number] DOB: 1946/06/03 DOA: 02/11/2024     9 DOS: the patient was seen and examined on 02/20/2024   Brief hospital course:  "EKIN BARDO is a 78 y.o. male with medical history significant of essential hypertension, dementia, frequent PVCs, hyperlipidemia who was brought in from a facility after the patient lost consciousness while sitting on the shower chair at the facility.  He is admitted for further management and evaluation of altered mental status.. "  Further hospital course and management as outlined below.   Assessment and Plan:  Acute metabolic encephalopathy due to pneumonia in a patient with underlying dementia.  Suspect may be showing progression of dementia CT scan of the brain unremarkable. Patient still confused likely progressive dementia in the setting of pneumonia Normal TSH, B12 as well as folic acid levels. --Continue delirium precautions. --Monitor neurochecks closely --Secondary school teacher for safety as needed Patient still requiring sitter at bedside which will make placement challenging.   Community-acquired pneumonia Chest x-ray showing findings of infiltrate --Continue Augmentin  for total 5 days. --Continue to monitor pulse ox.  Syncope in the setting of bradycardia-improved Upon arrival to the emergency room patient was found to be bradycardic in the 50s and had a drop of his pulse into the 20s. Echo showed EF 60 to 65% with normal diastolic parameters. Telemetry unremarkable. Avoid AV nodal blocking agents. Cardiologist advised no intervention of permanent pacemaker at this time.   Hypokalemia- resolved with replacement Monitor BMP   Incarcerated right sided inguinal hernia No surgical intervention recommended by surgery team. Continue as needed pain medication   Essential hypertension, Avoid AV nodal blocking agents Other antihypertensives on hold as recommended by cardiology   Severe  dementia with behavioral disturbances Continue delirium precaution Continue one-to-one sitter   Hyperlipidemia-does not appear to be on statin      Subjective: Pt seen with sitter at bedside this AM.  He denies being in pain or feeling sick. No acute events reported.  Physical Exam: Vitals:   02/19/24 1903 02/20/24 0314 02/20/24 0751 02/20/24 1258  BP: (!) 140/94 (!) 162/98 (!) 176/99 132/60  Pulse: 82 72 77 60  Resp: 19 18 16 16   Temp: 98 F (36.7 C) (!) 97.5 F (36.4 C) 97.6 F (36.4 C) 97.8 F (36.6 C)  TempSrc: Oral Oral Axillary Axillary  SpO2: 95% 98% 98% 99%  Weight:       General exam: awake, alert, no acute distress, confused but calm HEENT: moist mucus membranes, hearing grossly normal  Respiratory system: CTA, no wheezes, rales or rhonchi, normal respiratory effort. Cardiovascular system: normal S1/S2, RRR, no pedal edema.   Gastrointestinal system: soft, NT, ND Central nervous system: exam limited by dementia with inability to follow commands, no gross focal neurologic deficits, normal speech Extremities: moves all, no edema, normal tone Skin: dry, intact, normal temperature Psychiatry: normal mood, congruent affect, abnormal judgement and insight due to dementia   Data Reviewed:  Notable labs --   Glucose 125, Hbg 17.3   Family Communication: None present. Will attempt to call as time allows.  No new medical updates to provide at this time.  Pt is stable and improved, awaiting placement.  Disposition: Status is: Inpatient Remains inpatient appropriate because: unsafe d/c, awaiting placement   Planned Discharge Destination: Skilled nursing facility    Time spent: 25 minutes  Author: Montey Apa, DO 02/20/2024 2:36 PM  For on call review [email or web address].

## 2024-02-20 NOTE — TOC Progression Note (Signed)
 Transition of Care [facility]) - Progression Note    Patient Details  Name: Seth Thompson MRN: [id number] Date of Birth: [birth year]  Transition of Care [facility]) CM/SW Contact  Loman Risk, RN Phone Number: 02/20/2024, 4:17 PM  Clinical Narrative:     Per Lyndy Santos at [facility] house she does not feel like the they can meet the patients needs.  She is going to review with her Librarian, academic and make final decision [name] with dss notified  Fredrik Jensen states that they are pursing private pay 1 month at [facility] with Medicaid pending   Expected Discharge Plan: Skilled Nursing Facility Barriers to Discharge: Continued Medical Work up  Expected Discharge Plan and Services       Living arrangements for the past 2 months: Assisted Living Facility                                       Social Determinants of Health (SDOH) Interventions SDOH Screenings   Food Insecurity: No Food Insecurity (02/11/2024)  Housing: Low Risk  (02/11/2024)  Transportation Needs: No Transportation Needs (02/11/2024)  Utilities: Not At Risk (02/11/2024)  Social Connections: Socially Isolated (02/11/2024)  Tobacco Use: Low Risk  (02/12/2024)    Readmission Risk Interventions     No data to display

## 2024-02-21 DIAGNOSIS — R001 Bradycardia, unspecified: Secondary | ICD-10-CM | POA: Diagnosis not present

## 2024-02-21 NOTE — Progress Notes (Signed)
  Progress Note   Patient: Seth Thompson [id number]:[id number] DOB: June 29, 1946 DOA: 02/11/2024     10 DOS: the patient was seen and examined on 02/21/2024   Brief hospital course:  "Seth Thompson is a 78 y.o. male with medical history significant of essential hypertension, dementia, frequent PVCs, hyperlipidemia who was brought in from a facility after the patient lost consciousness while sitting on the shower chair at the facility.  He is admitted for further management and evaluation of altered mental status.. "  Further hospital course and management as outlined below.   Assessment and Plan:  Acute metabolic encephalopathy due to pneumonia in a patient with underlying dementia.  Suspect may be showing progression of dementia CT scan of the brain unremarkable. Patient still confused likely progressive dementia in the setting of pneumonia Normal TSH, B12 as well as folic acid levels. --Continue delirium precautions. --Monitor neurochecks closely --Secondary school teacher for safety as needed Patient still requiring sitter at bedside which will make placement challenging.   Community-acquired pneumonia Chest x-ray showing findings of infiltrate --Continue Augmentin  for total 5 days. --Continue to monitor pulse ox.  Syncope in the setting of bradycardia-improved Upon arrival to the emergency room patient was found to be bradycardic in the 50s and had a drop of his pulse into the 20s. Echo showed EF 60 to 65% with normal diastolic parameters. Telemetry unremarkable. Avoid AV nodal blocking agents. Cardiologist advised no intervention of permanent pacemaker at this time.   Hypokalemia- resolved with replacement Monitor BMP   Incarcerated right sided inguinal hernia No surgical intervention recommended by surgery team. Continue as needed pain medication   Essential hypertension, Avoid AV nodal blocking agents Other antihypertensives on hold as recommended by cardiology   Severe  dementia with behavioral disturbances Continue delirium precaution Continue one-to-one sitter   Hyperlipidemia-does not appear to be on statin      Subjective: Pt was sleeping comfortably when seen on AM rounds today. No acute events reported.  Pt responds to voice briefly, expresses no complaints.   Physical Exam: Vitals:   02/21/24 0251 02/21/24 0824 02/21/24 0853 02/21/24 1142  BP: (!) 152/84 (!) 163/74  134/66  Pulse: (!) 53 61 65 67  Resp: 18 16  16   Temp: 98.5 F (36.9 C)   (!) 97.5 F (36.4 C)  TempSrc:    Oral  SpO2: 98% 98% 100% 99%  Weight:      Height:       General exam: sleeping comfortably, no acute distress Respiratory system: on room air, normal respiratory effort. Cardiovascular system: RRR, no pedal edema.   Gastrointestinal system: soft, NT, ND Central nervous system: exam limited by somnolence, baseline dementia with inability to follow commands, no gross focal neurologic deficits Extremities: moves all, no edema, normal tone Skin: dry, intact, normal temperature Psychiatry: limited exam due to somnolence   Data Reviewed: No new labs today  Prior labs normal except glucose 125 hbg 17.3   Family Communication: None present. Will attempt to call as time allows.  No new medical updates to provide at this time.  Pt is stable and improved, awaiting placement.  Disposition: Status is: Inpatient Remains inpatient appropriate because: unsafe d/c, awaiting placement   Planned Discharge Destination: Skilled nursing facility    Time spent: 25 minutes  Author: Montey Apa, DO 02/21/2024 12:02 PM  For on call review [email or web address].

## 2024-02-22 DIAGNOSIS — R001 Bradycardia, unspecified: Secondary | ICD-10-CM | POA: Diagnosis not present

## 2024-02-22 NOTE — Progress Notes (Signed)
  Progress Note   Patient: Seth Thompson [id number] DOB: 1946-03-06 DOA: 02/11/2024     11 DOS: the patient was seen and examined on 02/22/2024   Brief hospital course:  "Seth Thompson is a 78 y.o. male with medical history significant of essential hypertension, dementia, frequent PVCs, hyperlipidemia who was brought in from a facility after the patient lost consciousness while sitting on the shower chair at the facility.  He is admitted for further management and evaluation of altered mental status.. "  Further hospital course and management as outlined below.   Assessment and Plan:  Acute metabolic encephalopathy due to pneumonia in a patient with underlying dementia.  Suspect may be showing progression of dementia CT scan of the brain unremarkable. Patient still confused likely progressive dementia in the setting of pneumonia Normal TSH, B12 as well as folic acid levels. --Continue delirium precautions. --Monitor neurochecks closely --Secondary school teacher for safety as needed Patient still requiring sitter at bedside which will make placement challenging.   Community-acquired pneumonia Chest x-ray showing findings of infiltrate --Continue Augmentin  for total 5 days. --Continue to monitor pulse ox.  Syncope in the setting of bradycardia-improved Upon arrival to the emergency room patient was found to be bradycardic in the 50s and had a drop of his pulse into the 20s. Echo showed EF 60 to 65% with normal diastolic parameters. Telemetry unremarkable. Avoid AV nodal blocking agents. Cardiologist advised no intervention of permanent pacemaker at this time.   Hypokalemia- resolved with replacement Monitor BMP   Incarcerated right sided inguinal hernia No surgical intervention recommended by surgery team. Continue as needed pain medication   Essential hypertension, Avoid AV nodal blocking agents Other antihypertensives on hold as recommended by cardiology   Severe  dementia with behavioral disturbances Continue delirium precaution Continue one-to-one sitter   Hyperlipidemia-does not appear to be on statin      Subjective: Pt was sleeping comfortably when seen on AM rounds today. He woke up to voice.  No acute complaints or events reported.  Per nursing, has remained calm and cooperative.  Mittens on hands and tele-sitter present.   Physical Exam: Vitals:   02/21/24 1633 02/21/24 1945 02/22/24 0353 02/22/24 0840  BP: (!) 135/94 (!) 140/70 121/81 (!) 145/68  Pulse: 82 61 (!) 54   Resp: 16 20 18 17   Temp: 97.6 F (36.4 C) 98.4 F (36.9 C) (!) 97.5 F (36.4 C) 97.9 F (36.6 C)  TempSrc:  Oral Oral Oral  SpO2: 95% 97% 100% 98%  Weight:      Height:       General exam: sleeping comfortably, no acute distress Respiratory system: on room air, normal respiratory effort. Cardiovascular system: RRR, no pedal edema.   Gastrointestinal system: soft, NT, ND Central nervous system: exam limited by somnolence, baseline dementia with inability to follow commands, no gross focal neurologic deficits Extremities: soft mittens on both hands, no edema, normal tone Skin: dry, intact, normal temperature   Data Reviewed: No new labs today  Prior labs normal except glucose 125 hbg 17.3   Family Communication: None present. Will attempt to call as time allows.  No new medical updates to provide at this time.  Pt is stable and improved, awaiting placement.  Disposition: Status is: Inpatient Remains inpatient appropriate because: unsafe d/c, awaiting placement   Planned Discharge Destination: Skilled nursing facility    Time spent: 25 minutes  Author: Montey Apa, DO 02/22/2024 12:36 PM  For on call review [email or web address].

## 2024-02-23 DIAGNOSIS — R001 Bradycardia, unspecified: Secondary | ICD-10-CM | POA: Diagnosis not present

## 2024-02-23 NOTE — Progress Notes (Signed)
 Occupational Therapy Treatment Patient Details Name: Seth Thompson MRN: [id number] DOB: Mar 22, 1946 Today's Date: 02/23/2024   History of present illness Pt is a 78 year old male admitted after LOC in shower, admitted with acute metabolic encephalopathy, syncope in the setting of bradycardia, hypokalemia, incarcerated right sided inguinal hernia. PMH significant for essential hypertension, dementia, frequent PVCs, and hyperlipidemia.   OT comments  Pt is supine in bed on arrival. Appears anxious, mittens in place and per nursing has been trying to remove his purewick. Pt appears to grimace and moan in pain when attempting to provide hand over hand assist to initiate movements and with gait belt placement. He continues to require constant, max multi modal cueing for his cognition to perform all tasks and Max/total assist x2 with all bed mobility. Posterior lean with sitting as pt is resistive and nervous. Progressed to Min/Mod A x1 for static seated balance. Attempted STS with stedy lift, however pt needs max assist for hand and feet placement, as well as total assist with only small clearance of buttocks. Removed stedy and attempted STS via HHA and max cognitive cueing with pt able to initiate and complete partial STS with Mod A x2 for ~10 seconds. Pt attempted lateral scoots, but was not able to progress towards [facility] therefore required total assist x2 to return to supine and scoot to [facility].  Pt returned to bed with all needs in place and will cont to require skilled acute OT services to maximize his safety and IND to return to PLOF.       If plan is discharge home, recommend the following:  Two people to help with walking and/or transfers;Two people to help with bathing/dressing/bathroom;Supervision due to cognitive status   Equipment Recommendations  Hoyer lift;Wheelchair (measurements OT)    Recommendations for Other Services      Precautions / Restrictions Precautions Precautions:  Fall Recall of Precautions/Restrictions: Impaired Restrictions Weight Bearing Restrictions Per Provider Order: No       Mobility Bed Mobility Overal bed mobility: Needs Assistance Bed Mobility: Sit to Supine, Rolling, Sidelying to Sit Rolling: +2 for physical assistance, Total assist Sidelying to sit: Total assist, +2 for physical assistance   Sit to supine: Max assist, +2 for physical assistance, Total assist   General bed mobility comments: total assist x2 throughout session as pt is resistive at times; initiating lateral scoot after increased time and max multi modal cueing provided espeically gestural cues    Transfers Overall transfer level: Needs assistance Equipment used: 2 person hand held assist, Ambulation equipment used Transfers: Sit to/from Stand Sit to Stand: +2 physical assistance, Mod assist, Max assist, From elevated surface           General transfer comment: attempted STS from stedy, however pt very anxious and resistive needed constant cues for hand placement, etc. with total assist x2 to attempt stand; attempted via HHA with mult modal cueing and pt reached partial STS with Mod A x2 for brief period before returning to seated EOB Transfer via Lift Equipment: Stedy   Balance Overall balance assessment: Needs assistance Sitting-balance support: Feet supported Sitting balance-Leahy Scale: Poor Sitting balance - Comments: posterior bias as pt is resistive and anxious with all movements; cueing for anterior weight shift and Min /mod A needed for seated balance Postural control: Posterior lean Standing balance support: Bilateral upper extremity supported Standing balance-Leahy Scale: Poor Standing balance comment: Mod AX2 for partial STS via HHA from elevated bed height  ADL either performed or assessed with clinical judgement   ADL Overall ADL's : Needs assistance/impaired                                        General ADL Comments: step by step multi modal cues for task initiation and participation; no ADLs assessed this date    Extremity/Trunk Assessment              Vision       Perception     Praxis     Communication Communication Communication: Impaired Factors Affecting Communication: Difficulty expressing self   Cognition Arousal: Alert Behavior During Therapy: Anxious Cognition: History of cognitive impairments, Cognition impaired   Orientation impairments: Place, Time, Situation                           Following commands: Impaired Following commands impaired: Follows one step commands inconsistently      Cueing   Cueing Techniques: Verbal cues, Gestural cues, Tactile cues, Visual cues  Exercises      Shoulder Instructions       General Comments      Pertinent Vitals/ Pain       Pain Assessment Pain Assessment: Faces Faces Pain Scale: Hurts little more Pain Location: at times when touching hands to cue for movement/placement, as well as gait belt placement Pain Intervention(s): Monitored during session, Repositioned  Home Living                                          Prior Functioning/Environment              Frequency  Min 1X/week        Progress Toward Goals  OT Goals(current goals can now be found in the care plan section)  Progress towards OT goals: Progressing toward goals  Acute Rehab OT Goals OT Goal Formulation: Patient unable to participate in goal setting Time For Goal Achievement: 02/27/24 Potential to Achieve Goals: Fair  Plan      Co-evaluation    PT/OT/SLP Co-Evaluation/Treatment: Yes Reason for Co-Treatment: Complexity of the patient's impairments (multi-system involvement);For patient/therapist safety PT goals addressed during session: Mobility/safety with mobility;Balance OT goals addressed during session: ADL's and self-care      AM-PAC OT "6 Clicks" Daily Activity      Outcome Measure   Help from another person eating meals?: A Little Help from another person taking care of personal grooming?: A Lot Help from another person toileting, which includes using toliet, bedpan, or urinal?: Total Help from another person bathing (including washing, rinsing, drying)?: Total Help from another person to put on and taking off regular upper body clothing?: A Lot Help from another person to put on and taking off regular lower body clothing?: Total 6 Click Score: 10    End of Session Equipment Utilized During Treatment: Gait belt;Other (comment) (stedy for attempted STS)  OT Visit Diagnosis: Other abnormalities of gait and mobility (R26.89);Cognitive communication deficit (R41.841);Other symptoms and signs involving cognitive function   Activity Tolerance Patient tolerated treatment well   Patient Left in bed;with call bell/phone within reach;with bed alarm set;Other (comment) (telesitter in room)   Nurse Communication Mobility status        Time: 0981-1914 OT Time Calculation (  min): 21 min  Charges: OT General Charges $OT Visit: 1 Visit OT Treatments $Therapeutic Activity: 8-22 mins  Blayke Pinera, OTR/L  02/23/24, 3:52 PM   Henslee Lottman E Ousman Dise 02/23/2024, 3:48 PM

## 2024-02-23 NOTE — Progress Notes (Signed)
 Physical Therapy Treatment Patient Details Name: Seth Thompson MRN: [id number] DOB: August 26, 1946 Today's Date: 02/23/2024   History of Present Illness Pt is a 78 year old male admitted after LOC in shower, admitted with acute metabolic encephalopathy, syncope in the setting of bradycardia, hypokalemia, incarcerated right sided inguinal hernia. PMH significant for essential hypertension, dementia, frequent PVCs, and hyperlipidemia.    PT Comments  Pt remains limited with all attempts at mobility/training secondary to cognitive deficits.  Pt very rarely able to follow even simple 1-step commands even with max multi-modal cuing.  Pt anxious with movements often actively resisting attempts to change position. Pt will benefit from a trial of continued PT services upon discharge to safely address deficits listed in patient problem list for decreased caregiver assistance and hopeful return to PLOF but may be more appropriate for LTC.    If plan is discharge home, recommend the following: Two people to help with walking and/or transfers;A lot of help with bathing/dressing/bathroom;Direct supervision/assist for medications management;Supervision due to cognitive status;Assist for transportation   Can travel by private vehicle     No  Equipment Recommendations  Other (comment) (TBD)    Recommendations for Other Services       Precautions / Restrictions Precautions Precautions: Fall Recall of Precautions/Restrictions: Impaired Restrictions Weight Bearing Restrictions Per Provider Order: No     Mobility  Bed Mobility Overal bed mobility: Needs Assistance Bed Mobility: Sit to Supine, Rolling, Sidelying to Sit Rolling: +2 for physical assistance, Total assist Sidelying to sit: Total assist, +2 for physical assistance   Sit to supine: +2 for physical assistance, Total assist   General bed mobility comments: total assist x2 throughout session; pt is resistive at times     Transfers Overall transfer level: Needs assistance Equipment used: 2 person hand held assist Transfers: Sit to/from Stand Sit to Stand: +2 physical assistance, Mod assist, Max assist, From elevated surface           General transfer comment: attempted STS from stedy multiple times however pt very anxious and resistive, needed constant cues and physical assistance for hand placement and to initiate standing; even with total assist x2 provided pt unable to clear the surface of the bed; attempted sit to stand via +2 HHA with mod A and with mult modal cueing - pt achieved partial standing only with knees and hips flexed Transfer via Lift Equipment: Stedy  Ambulation/Gait               General Gait Details: Unable/unsafe to attempt   Stairs             Wheelchair Mobility     Tilt Bed    Modified Rankin (Stroke Patients Only)       Balance Overall balance assessment: Needs assistance Sitting-balance support: Feet supported Sitting balance-Leahy Scale: Poor Sitting balance - Comments: posterior bias as pt is resistive and anxious with all movements; cueing for anterior weight shift and Min /mod A needed for seated balance   Standing balance support: Bilateral upper extremity supported Standing balance-Leahy Scale: Poor Standing balance comment: Mod AX2 for partial STS via HHA from elevated bed height                            Communication Communication Communication: Impaired Factors Affecting Communication: Difficulty expressing self  Cognition Arousal: Alert Behavior During Therapy: Anxious   PT - Cognitive impairments: No family/caregiver present to determine baseline  Following commands: Impaired Following commands impaired: Follows one step commands inconsistently    Cueing Cueing Techniques: Verbal cues, Gestural cues, Tactile cues, Visual cues  Exercises Other Exercises Other Exercises: Anterior  weight shifting in sitting at the EOB    General Comments        Pertinent Vitals/Pain Pain Assessment Pain Assessment: PAINAD Breathing: normal Negative Vocalization: none Facial Expression: sad, frightened, frown Body Language: tense, distressed pacing, fidgeting Consolability: distracted or reassured by voice/touch PAINAD Score: 3 Pain Intervention(s): Monitored during session    Home Living                          Prior Function            PT Goals (current goals can now be found in the care plan section) Progress towards PT goals: Not progressing toward goals - comment (limited by cognition)    Frequency    Min 1X/week      PT Plan      Co-evaluation PT/OT/SLP Co-Evaluation/Treatment: Yes Reason for Co-Treatment: Complexity of the patient's impairments (multi-system involvement);For patient/therapist safety PT goals addressed during session: Mobility/safety with mobility;Balance OT goals addressed during session: ADL's and self-care      AM-PAC PT "6 Clicks" Mobility   Outcome Measure  Help needed turning from your back to your side while in a flat bed without using bedrails?: Total Help needed moving from lying on your back to sitting on the side of a flat bed without using bedrails?: Total Help needed moving to and from a bed to a chair (including a wheelchair)?: Total Help needed standing up from a chair using your arms (e.g., wheelchair or bedside chair)?: Total Help needed to walk in hospital room?: Total Help needed climbing 3-5 steps with a railing? : Total 6 Click Score: 6    End of Session Equipment Utilized During Treatment: Gait belt Activity Tolerance: Other (comment) (limited by cognitive deficits) Patient left: in bed;with bed alarm set;with nursing/sitter in room;with call bell/phone within reach;Other (comment) (safety mittens donned) Nurse Communication: Mobility status PT Visit Diagnosis: Unsteadiness on feet  (R26.81);Difficulty in walking, not elsewhere classified (R26.2);Muscle weakness (generalized) (M62.81)     Time: 1610-9604 PT Time Calculation (min) (ACUTE ONLY): 24 min  Charges:    $Therapeutic Activity: 8-22 mins PT General Charges $$ ACUTE PT VISIT: 1 Visit                     D. Scott Carolyna Yerian PT, DPT 02/23/24, 4:40 PM

## 2024-02-23 NOTE — Plan of Care (Signed)

## 2024-02-23 NOTE — Plan of Care (Signed)
   Problem: Education: Goal: Knowledge of General Education information will improve Description Including pain rating scale, medication(s)/side effects and non-pharmacologic comfort measures Outcome: Progressing

## 2024-02-23 NOTE — Progress Notes (Signed)
  Progress Note   Patient: Seth Thompson ZOX:[id number] DOB: Feb 15, 1946 DOA: 02/11/2024     12 DOS: the patient was seen and examined on 02/23/2024   Brief hospital course:  "Seth Thompson is a 78 y.o. male with medical history significant of essential hypertension, dementia, frequent PVCs, hyperlipidemia who was brought in from a facility after the patient lost consciousness while sitting on the shower chair at the facility.  He is admitted for further management and evaluation of altered mental status.. "  Further hospital course and management as outlined below.   Assessment and Plan:  Acute metabolic encephalopathy due to pneumonia in a patient with underlying dementia.  Suspect may be showing progression of dementia CT scan of the brain unremarkable. Patient still confused likely progressive dementia in the setting of pneumonia Normal TSH, B12 as well as folic acid levels. --Continue delirium precautions. --Monitor neurochecks closely --Secondary school teacher for safety as needed Patient still requiring sitter at bedside which will make placement challenging.   Community-acquired pneumonia Chest x-ray showing findings of infiltrate --Continue Augmentin  for total 5 days. --Continue to monitor pulse ox.  Syncope in the setting of bradycardia-improved Upon arrival to the emergency room patient was found to be bradycardic in the 50s and had a drop of his pulse into the 20s. Echo showed EF 60 to 65% with normal diastolic parameters. Telemetry unremarkable. Avoid AV nodal blocking agents. Cardiologist advised no intervention of permanent pacemaker at this time.   Hypokalemia- resolved with replacement Monitor BMP   Incarcerated right sided inguinal hernia No surgical intervention recommended by surgery team. Continue as needed pain medication   Essential hypertension, Avoid AV nodal blocking agents Other antihypertensives on hold as recommended by cardiology   Severe  dementia with behavioral disturbances Continue delirium precaution Continue one-to-one sitter   Hyperlipidemia-does not appear to be on statin      Subjective: Pt was sleeping comfortably when seen on AM rounds today.  No acute complaints or events reported.     Physical Exam: Vitals:   02/22/24 1605 02/22/24 2018 02/23/24 0406 02/23/24 0845  BP: (!) 139/112 (!) 137/96 (!) 162/95 (!) 153/94  Pulse:  72 61 63  Resp: 17 18 20 17   Temp: 98.1 F (36.7 C)  98 F (36.7 C) 97.6 F (36.4 C)  TempSrc: Oral   Oral  SpO2: 97% 100% 96%   Weight:      Height:       General exam: sleeping comfortably, no acute distress Respiratory system: on room air, normal respiratory effort. Cardiovascular system: RRR, no pedal edema.   Gastrointestinal system: soft, NT, ND Central nervous system: exam limited by somnolence, baseline dementia with inability to follow commands, no gross focal neurologic deficits Extremities: soft mittens on both hands, no edema, normal tone Skin: dry, intact, normal temperature   Data Reviewed: No new labs today  Prior labs normal except glucose 125 hbg 17.3   Family Communication: None present. Will attempt to call as time allows.  No new medical updates to provide at this time.  Pt is stable and improved, awaiting placement.  Disposition: Status is: Inpatient Remains inpatient appropriate because: unsafe d/c, awaiting placement   Planned Discharge Destination: Skilled nursing facility    Time spent: 25 minutes  Author: Montey Apa, DO 02/23/2024 1:44 PM  For on call review [email or web address].

## 2024-02-23 NOTE — TOC Progression Note (Signed)
 Transition of Care [facility]) - Progression Note    Patient Details  Name: Seth Thompson MRN: [id number] Date of Birth: 1946/07/10  [facility] CM/SW Contact  Loman Risk, RN Phone Number: 02/23/2024, 10:45 AM  Clinical Narrative:     Per Fredrik Jensen with DSS [facility] is unable to offer a bed medicaid pending 1st month private pay  Extended bed search with the above stipulations   Expected Discharge Plan: Skilled Nursing Facility Barriers to Discharge: Continued Medical Work up  Expected Discharge Plan and Services       Living arrangements for the past 2 months: Assisted Living Facility                                       Social Determinants of Health (SDOH) Interventions SDOH Screenings   Food Insecurity: No Food Insecurity (02/11/2024)  Housing: Low Risk  (02/11/2024)  Transportation Needs: No Transportation Needs (02/11/2024)  Utilities: Not At Risk (02/11/2024)  Social Connections: Socially Isolated (02/11/2024)  Tobacco Use: Low Risk  (02/12/2024)    Readmission Risk Interventions     No data to display

## 2024-02-24 DIAGNOSIS — R001 Bradycardia, unspecified: Secondary | ICD-10-CM | POA: Diagnosis not present

## 2024-02-24 MED ORDER — ENSURE ENLIVE PO LIQD
237.0000 mL | Freq: Two times a day (BID) | ORAL | Status: DC
Start: 2024-02-24 — End: 2024-04-02
  Administered 2024-02-24 – 2024-04-02 (×69): 237 mL via ORAL
  Filled 2024-02-24: qty 237

## 2024-02-24 NOTE — TOC Progression Note (Signed)
 Transition of Care [facility]) - Progression Note    Patient Details  Name: Seth Thompson MRN: [id number] Date of Birth: [birth year]  Transition of Care [facility]) CM/SW Contact  Loman Risk, RN Phone Number: 02/24/2024, 3:32 PM  Clinical Narrative:     Bed offers received from - [facility] - VM left with Gordan Latina to confirm they can accept patient medicaid pending, with DSS private paying 1 month - Kelly at [facility] confirms they can offer a bed  Notified Fredrik Jensen with DSS of the above. Fredrik Jensen to provide [facility] additional financial information tomorrow so they can determine if they are able to offer a bed   Expected Discharge Plan: Skilled Nursing Facility Barriers to Discharge: Continued Medical Work up  Expected Discharge Plan and Services       Living arrangements for the past 2 months: Assisted Living Facility                                       Social Determinants of Health (SDOH) Interventions SDOH Screenings   Food Insecurity: No Food Insecurity (02/11/2024)  Housing: Low Risk  (02/11/2024)  Transportation Needs: No Transportation Needs (02/11/2024)  Utilities: Not At Risk (02/11/2024)  Social Connections: Socially Isolated (02/11/2024)  Tobacco Use: Low Risk  (02/12/2024)    Readmission Risk Interventions     No data to display

## 2024-02-24 NOTE — Plan of Care (Signed)

## 2024-02-24 NOTE — Plan of Care (Signed)
  Problem: Coping: Goal: Level of anxiety will decrease Outcome: Progressing   Problem: Pain Managment: Goal: General experience of comfort will improve and/or be controlled Outcome: Progressing   Problem: Safety: Goal: Ability to remain free from injury will improve Outcome: Progressing   Problem: Skin Integrity: Goal: Risk for impaired skin integrity will decrease Outcome: Progressing   Problem: Safety: Goal: Non-violent Restraint(s) Outcome: Progressing

## 2024-02-24 NOTE — Progress Notes (Signed)
  Progress Note   Patient: Seth Thompson BJY:[id number] DOB: Apr 28, 1946 DOA: 02/11/2024     13 DOS: the patient was seen and examined on 02/24/2024   Brief hospital course:  "JAHDIEL KROL is a 78 y.o. male with medical history significant of essential hypertension, dementia, frequent PVCs, hyperlipidemia who was brought in from a facility after the patient lost consciousness while sitting on the shower chair at the facility.  He is admitted for further management and evaluation of altered mental status.. "  Further hospital course and management as outlined below.   Assessment and Plan:  Acute metabolic encephalopathy due to pneumonia in a patient with underlying dementia.  Suspect may be showing progression of dementia CT scan of the brain unremarkable. Patient still confused likely progressive dementia in the setting of pneumonia Normal TSH, B12 as well as folic acid levels. --Continue delirium precautions. --Monitor neurochecks closely --Secondary school teacher for safety as needed Patient still requiring sitter at bedside which will make placement challenging.   Community-acquired pneumonia Chest x-ray showing findings of infiltrate --Continue Augmentin  for total 5 days. --Continue to monitor pulse ox.  Syncope in the setting of bradycardia-improved Upon arrival to the emergency room patient was found to be bradycardic in the 50s and had a drop of his pulse into the 20s. Echo showed EF 60 to 65% with normal diastolic parameters. Telemetry unremarkable. Avoid AV nodal blocking agents. Cardiologist advised no intervention of permanent pacemaker at this time.   Hypokalemia- resolved with replacement Monitor BMP   Incarcerated right sided inguinal hernia No surgical intervention recommended by surgery team. Continue as needed pain medication   Essential hypertension, Avoid AV nodal blocking agents Other antihypertensives on hold as recommended by cardiology   Severe  dementia with behavioral disturbances Continue delirium precaution Continue one-to-one sitter   Hyperlipidemia-does not appear to be on statin      Subjective: Pt was sleeping comfortably when seen on AM rounds today.  No acute complaints or events reported.     Physical Exam: Vitals:   02/23/24 1738 02/23/24 2018 02/24/24 0400 02/24/24 0749  BP: (!) 145/81 (!) 114/90 (!) 157/83 (!) 160/85  Pulse: 70 77 (!) 53 (!) 58  Resp: 18 20 12 16   Temp: 97.9 F (36.6 C) 97.6 F (36.4 C) 97.6 F (36.4 C) 98.5 F (36.9 C)  TempSrc:  Axillary Oral   SpO2: 98% 94% 98% 99%  Weight:      Height:       General exam: sleeping comfortably, no acute distress Respiratory system: on room air, normal respiratory effort. Cardiovascular system: RRR, no pedal edema.   Gastrointestinal system: soft, NT, ND Central nervous system: exam limited by somnolence, baseline dementia with inability to follow commands, no gross focal neurologic deficits Extremities: soft mittens on both hands, no edema, normal tone Skin: dry, intact, normal temperature   Data Reviewed: No new labs today  Prior labs normal except glucose 125 hbg 17.3   Family Communication: None present. Will attempt to call as time allows.  No new medical updates to provide at this time.  Pt is stable and improved, awaiting placement.  Disposition: Status is: Inpatient Remains inpatient appropriate because: unsafe d/c, awaiting placement   Planned Discharge Destination: Skilled nursing facility    Time spent: 25 minutes  Author: Montey Apa, DO 02/24/2024 12:35 PM  For on call review [email or web address].

## 2024-02-25 DIAGNOSIS — R001 Bradycardia, unspecified: Secondary | ICD-10-CM | POA: Diagnosis not present

## 2024-02-25 LAB — CBC
HCT: 49.5 % (ref 39.0–52.0)
Hemoglobin: 17.1 g/dL — ABNORMAL HIGH (ref 13.0–17.0)
MCH: 30.8 pg (ref 26.0–34.0)
MCHC: 34.5 g/dL (ref 30.0–36.0)
MCV: 89 fL (ref 80.0–100.0)
Platelets: 245 10*3/uL (ref 150–400)
RBC: 5.56 MIL/uL (ref 4.22–5.81)
RDW: 12.3 % (ref 11.5–15.5)
WBC: 6.8 10*3/uL (ref 4.0–10.5)
nRBC: 0 % (ref 0.0–0.2)

## 2024-02-25 LAB — BASIC METABOLIC PANEL WITH GFR
Anion gap: 10 (ref 5–15)
BUN: 23 mg/dL (ref 8–23)
CO2: 24 mmol/L (ref 22–32)
Calcium: 9.2 mg/dL (ref 8.9–10.3)
Chloride: 103 mmol/L (ref 98–111)
Creatinine, Ser: 0.93 mg/dL (ref 0.61–1.24)
GFR, Estimated: >60 mL/min (ref >60)
Glucose, Bld: 133 mg/dL — ABNORMAL HIGH (ref 70–99)
Potassium: 3.6 mmol/L (ref 3.5–5.1)
Sodium: 137 mmol/L (ref 135–145)

## 2024-02-25 LAB — MAGNESIUM: Magnesium: 2.1 mg/dL (ref 1.7–2.4)

## 2024-02-25 LAB — PHOSPHORUS: Phosphorus: 3.7 mg/dL (ref 2.5–4.6)

## 2024-02-25 NOTE — Plan of Care (Signed)
  Problem: Activity: Goal: Risk for activity intolerance will decrease Outcome: Progressing   Problem: Coping: Goal: Level of anxiety will decrease Outcome: Progressing   Problem: Elimination: Goal: Will not experience complications related to bowel motility Outcome: Progressing Goal: Will not experience complications related to urinary retention Outcome: Progressing   Problem: Pain Managment: Goal: General experience of comfort will improve and/or be controlled Outcome: Progressing   Problem: Safety: Goal: Ability to remain free from injury will improve Outcome: Progressing

## 2024-02-25 NOTE — Plan of Care (Signed)
  Problem: Education: Goal: Knowledge of General Education information will improve Description: Including pain rating scale, medication(s)/side effects and non-pharmacologic comfort measures Outcome: Progressing   Problem: Health Behavior/Discharge Planning: Goal: Ability to manage health-related needs will improve Outcome: Progressing   Problem: Clinical Measurements: Goal: Ability to maintain clinical measurements within normal limits will improve Outcome: Progressing Goal: Will remain free from infection Outcome: Progressing Goal: Diagnostic test results will improve Outcome: Progressing Goal: Respiratory complications will improve Outcome: Progressing Goal: Cardiovascular complication will be avoided Outcome: Progressing   Problem: Activity: Goal: Risk for activity intolerance will decrease Outcome: Progressing   Problem: Coping: Goal: Level of anxiety will decrease Outcome: Progressing   Problem: Nutrition: Goal: Adequate nutrition will be maintained Outcome: Progressing   Problem: Elimination: Goal: Will not experience complications related to bowel motility Outcome: Progressing Goal: Will not experience complications related to urinary retention Outcome: Progressing   

## 2024-02-25 NOTE — Progress Notes (Signed)
 Physical Therapy Treatment Patient Details Name: Seth Thompson MRN: [id number] DOB: [birth year] Today's Date: 02/25/2024   History of Present Illness Pt is a 78 year old male admitted after LOC in shower, admitted with acute metabolic encephalopathy, syncope in the setting of bradycardia, hypokalemia, incarcerated right sided inguinal hernia. PMH significant for essential hypertension, dementia, frequent PVCs, and hyperlipidemia.    PT Comments  Pt notably more alert and less anxious with mobility this session.  Pt continue to require substantial physical assistance with functional tasks but presented with grossly improved effort and was able to stand with improved posture for longer duration and with less assistance.  Pt required +2 mod A to come to standing but once up was able to maintain static standing balance with +2 HHA on the first attempt and with a RW on the second with only +1 min A for stability.  Pt able to remain in static standing 2 x 2-3 min before needing to return to sitting.  Pt was also able to take several very small, shuffling steps at the EOB with max multi-modal cuing to initiate movement.  Pt will benefit from continued PT services upon discharge to safely address deficits listed in patient problem list for decreased caregiver assistance and eventual return to PLOF.     If plan is discharge home, recommend the following: Two people to help with walking and/or transfers;A lot of help with bathing/dressing/bathroom;Direct supervision/assist for medications management;Supervision due to cognitive status;Assist for transportation   Can travel by private vehicle     No  Equipment Recommendations  Other (comment) (TBD at next venue of care)    Recommendations for Other Services       Precautions / Restrictions Precautions Precautions: Fall Recall of Precautions/Restrictions: Impaired Restrictions Weight Bearing Restrictions Per Provider Order: No     Mobility  Bed  Mobility Overal bed mobility: Needs Assistance Bed Mobility: Supine to Sit, Sit to Supine     Supine to sit: Max assist, +2 for physical assistance Sit to supine: Max assist, +2 for physical assistance   General bed mobility comments: Heavy assist for BLE and trunk control    Transfers Overall transfer level: Needs assistance Equipment used: 2 person hand held assist, Rolling walker (2 wheels) Transfers: Sit to/from Stand Sit to Stand: +2 physical assistance, Mod assist           General transfer comment: Heavy verbal and tactile cuing to initiate standing but pt able to stand with grossly decreased assistance with +2 Mod A to come to standing and +1 min A for stability once in standing    Ambulation/Gait               General Gait Details: With heavy cuing pt able to take several very small, shuffling steps backwards and laterally to the right but no true ambulation   Stairs             Wheelchair Mobility     Tilt Bed    Modified Rankin (Stroke Patients Only)       Balance Overall balance assessment: Needs assistance Sitting-balance support: Feet supported Sitting balance-Leahy Scale: Fair     Standing balance support: Bilateral upper extremity supported, During functional activity Standing balance-Leahy Scale: Poor                              Communication Communication Communication: Impaired Factors Affecting Communication: Difficulty expressing self  Cognition Arousal: Alert  Behavior During Therapy: Flat affect   PT - Cognitive impairments: No family/caregiver present to determine baseline                       PT - Cognition Comments: Signficant cognitive deficits but more alert this session and able to follow several 1-step commands with extra time and cuing Following commands: Impaired Following commands impaired: Follows one step commands inconsistently    Cueing Cueing Techniques: Verbal cues, Gestural  cues, Tactile cues, Visual cues  Exercises Other Exercises Other Exercises: Anterior weight shifting in sitting at the EOB    General Comments        Pertinent Vitals/Pain Pain Assessment Breathing: normal Negative Vocalization: occasional moan/groan, low speech, negative/disapproving quality Facial Expression: smiling or inexpressive Body Language: relaxed Consolability: distracted or reassured by voice/touch PAINAD Score: 2 Pain Intervention(s): Monitored during session    Home Living                          Prior Function            PT Goals (current goals can now be found in the care plan section) Progress towards PT goals: Progressing toward goals    Frequency    Min 1X/week      PT Plan      Co-evaluation              AM-PAC PT "6 Clicks" Mobility   Outcome Measure  Help needed turning from your back to your side while in a flat bed without using bedrails?: Total Help needed moving from lying on your back to sitting on the side of a flat bed without using bedrails?: Total Help needed moving to and from a bed to a chair (including a wheelchair)?: Total Help needed standing up from a chair using your arms (e.g., wheelchair or bedside chair)?: A Lot Help needed to walk in hospital room?: Total Help needed climbing 3-5 steps with a railing? : Total 6 Click Score: 7    End of Session Equipment Utilized During Treatment: Gait belt Activity Tolerance: Patient tolerated treatment well Patient left: in bed;with bed alarm set;with nursing/sitter in room;with call bell/phone within reach Nurse Communication: Mobility status PT Visit Diagnosis: Unsteadiness on feet (R26.81);Difficulty in walking, not elsewhere classified (R26.2);Muscle weakness (generalized) (M62.81)     Time: 1610-9604 PT Time Calculation (min) (ACUTE ONLY): 19 min  Charges:    $Therapeutic Activity: 8-22 mins PT General Charges $$ ACUTE PT VISIT: 1 Visit                      D. Madalyn Scarce PT, DPT 02/25/24, 4:22 PM

## 2024-02-25 NOTE — TOC Progression Note (Signed)
 Transition of Care [facility]) - Progression Note    Patient Details  Name: Seth Thompson MRN: [id number] Date of Birth: November 03, 1945  [facility]) CM/SW Contact  Loman Risk, RN Phone Number: 02/25/2024, 10:42 AM  Clinical Narrative:     Per Fredrik Jensen with DSS she is waiting response from patient's Guardian of the estate prior to moving forward with placement   Expected Discharge Plan: Skilled Nursing Facility Barriers to Discharge: Continued Medical Work up  Expected Discharge Plan and Services       Living arrangements for the past 2 months: Assisted Living Facility                                       Social Determinants of Health (SDOH) Interventions SDOH Screenings   Food Insecurity: No Food Insecurity (02/11/2024)  Housing: Low Risk  (02/11/2024)  Transportation Needs: No Transportation Needs (02/11/2024)  Utilities: Not At Risk (02/11/2024)  Social Connections: Socially Isolated (02/11/2024)  Tobacco Use: Low Risk  (02/12/2024)    Readmission Risk Interventions     No data to display

## 2024-02-25 NOTE — Progress Notes (Signed)
  PROGRESS NOTE    Seth Thompson  WGN:[id number] DOB: [birth year] DOA: 02/11/2024 PCP: [facility]  211A/211A-AA  LOS: 14 days   Brief hospital course:   Assessment & Plan: Seth Thompson is a 78 y.o. male with medical history significant of essential hypertension, dementia, frequent PVCs, hyperlipidemia who was brought in from a facility after the patient lost consciousness while sitting on the shower chair at the facility.  He is admitted for further management and evaluation of altered mental status    Acute metabolic encephalopathy  Underlying dementia CT scan of the brain unremarkable. Patient still confused likely progressive dementia in the setting of pneumonia Normal TSH, B12 as well as folic acid levels. --Continue delirium precautions.   Community-acquired pneumonia Chest x-ray showing findings of infiltrate --Augmentin  for total 5 days.  Syncope in the setting of bradycardia-improved Upon arrival to the emergency room patient was found to be bradycardic in the 50s and had a drop of his pulse into the 20s. Echo showed EF 60 to 65% with normal diastolic parameters. Telemetry unremarkable. Avoid AV nodal blocking agents. Cardiologist advised no intervention of permanent pacemaker at this time.   Hypokalemia - resolved with replacement Monitor BMP   Incarcerated right sided inguinal hernia No surgical intervention recommended by surgery team. Continue as needed pain medication   Essential hypertension, --cont amlodipine  and losartan    Severe dementia with behavioral disturbances --cont seroquel    Hyperlipidemia -does not appear to be on statin   DVT prophylaxis: Lovenox  SQ Code Status: DNR  Family Communication:  Level of care: Med-Surg Dispo:   The patient is from: ALF Anticipated d/c is to: SNF Anticipated d/c date is: whenever bed available    Subjective and Interval History:  Confused, not coherent to answer  questions.   Objective: Vitals:   02/25/24 0305 02/25/24 0900 02/25/24 1559 02/25/24 1931  BP: (!) 124/96 (!) 161/79 (!) 149/66 133/67  Pulse: (!) 58 70 (!) 56 61  Resp: 20 17 17 19   Temp: (!) 96.9 F (36.1 C) 98.5 F (36.9 C) 97.7 F (36.5 C)   TempSrc: Axillary Oral Oral   SpO2: 95% 95% 100% 98%  Weight:      Height:        Intake/Output Summary (Last 24 hours) at 02/25/2024 2128 Last data filed at 02/25/2024 1600 Gross per 24 hour  Intake 0 ml  Output 300 ml  Net -300 ml   Filed Weights   02/11/24 0633 02/17/24 0405 02/20/24 2100  Weight: 89.9 kg 78.3 kg 79.1 kg    Examination:   Constitutional: NAD, alert, not oriented, mittens on HEENT: conjunctivae and lids normal, EOMI CV: No cyanosis.   RESP: normal respiratory effort, on RA Neuro: II - XII grossly intact.     Data Reviewed: I have personally reviewed labs and imaging studies  Time spent: 35 minutes  Garrison Kanner, MD [facility] If 7PM-7AM, please contact night-coverage 02/25/2024, 9:28 PM

## 2024-02-26 DIAGNOSIS — R001 Bradycardia, unspecified: Secondary | ICD-10-CM | POA: Diagnosis not present

## 2024-02-26 NOTE — Progress Notes (Signed)
  PROGRESS NOTE    Seth Thompson  UEA:[id number] DOB: 1945/11/15 DOA: 02/11/2024 PCP: [facility]  211A/211A-AA  LOS: 15 days   Brief hospital course:   Assessment & Plan: Seth Thompson is a 78 y.o. male with medical history significant of essential hypertension, dementia, frequent PVCs, hyperlipidemia who was brought in from a facility after the patient lost consciousness while sitting on the shower chair at the facility.  He is admitted for further management and evaluation of altered mental status    Acute metabolic encephalopathy  Underlying dementia CT scan of the brain unremarkable. Patient still confused likely progressive dementia in the setting of pneumonia Normal TSH, B12 as well as folic acid levels. --Continue delirium precautions.   Community-acquired pneumonia Chest x-ray showing findings of infiltrate --Augmentin  for total 5 days.  Syncope in the setting of bradycardia-improved Upon arrival to the emergency room patient was found to be bradycardic in the 50s and had a drop of his pulse into the 20s. Echo showed EF 60 to 65% with normal diastolic parameters. Telemetry unremarkable. Avoid AV nodal blocking agents. Cardiologist advised no intervention of permanent pacemaker at this time.   Hypokalemia - resolved with replacement Monitor BMP   Incarcerated right sided inguinal hernia No surgical intervention recommended by surgery team. Continue as needed pain medication   Essential hypertension, --cont amlodipine  and losartan    Severe dementia with behavioral disturbances --cont seroquel    Hyperlipidemia -does not appear to be on statin   DVT prophylaxis: Lovenox  SQ Code Status: DNR  Family Communication:  Level of care: Med-Surg Dispo:   The patient is from: ALF Anticipated d/c is to: SNF Anticipated d/c date is: whenever bed available    Subjective and Interval History:  No new event today.   Objective: Vitals:   02/26/24  0233 02/26/24 0846 02/26/24 1557 02/26/24 1945  BP: (!) 153/86 (!) 163/81 (!) 106/52 92/76  Pulse: 61 70 81 84  Resp: 19 18 18 18   Temp: 98.1 F (36.7 C) 97.6 F (36.4 C) 97.9 F (36.6 C) 98.8 F (37.1 C)  TempSrc: Oral Oral Oral Oral  SpO2: 100% 99% 94% 96%  Weight:      Height:       No intake or output data in the 24 hours ending 02/26/24 2121  Filed Weights   02/11/24 4782 02/17/24 0405 02/20/24 2100  Weight: 89.9 kg 78.3 kg 79.1 kg    Examination:   Constitutional: NAD CV: No cyanosis.   RESP: normal respiratory effort, on RA Extremities: No effusions, edema in BLE   Data Reviewed: I have personally reviewed labs and imaging studies  Time spent: 25 minutes  Garrison Kanner, MD [facility] If 7PM-7AM, please contact night-coverage 02/26/2024, 9:21 PM

## 2024-02-26 NOTE — TOC Progression Note (Signed)
 Transition of Care [facility]) - Progression Note    Patient Details  Name: Seth Thompson MRN: [id number] Date of Birth: [birth year]  Transition of Care [facility]) CM/SW Contact  Loman Risk, RN Phone Number: 02/26/2024, 10:20 AM  Clinical Narrative:    Per Fredrik Jensen with DSS she has confirmed patients financial information, and is reaching out to the facilities that have offered a bed to discuss private pay    Expected Discharge Plan: Skilled Nursing Facility Barriers to Discharge: Continued Medical Work up  Expected Discharge Plan and Services       Living arrangements for the past 2 months: Assisted Living Facility                                       Social Determinants of Health (SDOH) Interventions SDOH Screenings   Food Insecurity: No Food Insecurity (02/11/2024)  Housing: Low Risk  (02/11/2024)  Transportation Needs: No Transportation Needs (02/11/2024)  Utilities: Not At Risk (02/11/2024)  Social Connections: Socially Isolated (02/11/2024)  Tobacco Use: Low Risk  (02/12/2024)    Readmission Risk Interventions     No data to display

## 2024-02-26 NOTE — Plan of Care (Signed)
  Problem: Coping: Goal: Level of anxiety will decrease Outcome: Progressing   Problem: Pain Managment: Goal: General experience of comfort will improve and/or be controlled Outcome: Progressing   Problem: Safety: Goal: Ability to remain free from injury will improve Outcome: Progressing   Problem: Safety: Goal: Non-violent Restraint(s) Outcome: Progressing

## 2024-02-27 DIAGNOSIS — R001 Bradycardia, unspecified: Secondary | ICD-10-CM | POA: Diagnosis not present

## 2024-02-27 NOTE — TOC Progression Note (Addendum)
 Transition of Care [facility]) - Progression Note    Patient Details  Name: Seth Thompson MRN: [id number] Date of Birth: [birth year]  [facility]) CM/SW Contact  Loman Risk, RN Phone Number: 02/27/2024, 9:39 AM  Clinical Narrative:     Per Fredrik Jensen with DSS they intend to proceed with [facility] sent to [place] with DSS and Abran Abrahams at [facility] to Determine if there is any update   Update: Abran Abrahams at [facility] awaiting waiting on return call from [place] [facility] [place]  Expected Discharge Plan: Skilled Nursing Facility Barriers to Discharge: Continued Medical Work up  Expected Discharge Plan and Services       Living arrangements for the past 2 months: Assisted Living Facility                                       Social Determinants of Health (SDOH) Interventions SDOH Screenings   Food Insecurity: No Food Insecurity (02/11/2024)  Housing: Low Risk  (02/11/2024)  Transportation Needs: No Transportation Needs (02/11/2024)  Utilities: Not At Risk (02/11/2024)  Social Connections: Socially Isolated (02/11/2024)  Tobacco Use: Low Risk  (02/12/2024)    Readmission Risk Interventions     No data to display

## 2024-02-27 NOTE — Progress Notes (Signed)
 Physical Therapy Treatment Patient Details Name: Seth Thompson MRN: [id number] DOB: 09-09-1946 Today's Date: 02/27/2024   History of Present Illness Pt is a 78 year old male admitted after LOC in shower, admitted with acute metabolic encephalopathy, syncope in the setting of bradycardia, hypokalemia, incarcerated right sided inguinal hernia. PMH significant for essential hypertension, dementia, frequent PVCs, and hyperlipidemia.    PT Comments  Pt resting in bed upon PT arrival; able to state name.  OT arrived during session for PT/OT co-treatment.  During session pt was 1-2 assist with bed mobility; fluctuating assist for sitting balance (mostly close SBA to CGA to min assist d/t posterior lean); mod assist x2 to stand from elevated bed height; and mod assist x2 to ambulate a few feet forward and then side step to R about 4 feet (back to bed) with B hand hold assist (assist for standing balance d/t posterior lean).  Will continue to focus on strengthening, balance, and progressive functional mobility during hospitalization.   If plan is discharge home, recommend the following: Two people to help with walking and/or transfers;A lot of help with bathing/dressing/bathroom;Direct supervision/assist for medications management;Supervision due to cognitive status;Assist for transportation   Can travel by private vehicle     No  Equipment Recommendations  Other (comment) (TBD at next venue of care)    Recommendations for Other Services       Precautions / Restrictions Precautions Precautions: Fall Recall of Precautions/Restrictions: Impaired Restrictions Weight Bearing Restrictions Per Provider Order: No     Mobility  Bed Mobility Overal bed mobility: Needs Assistance Bed Mobility: Supine to Sit, Sit to Supine     Supine to sit: Max assist, HOB elevated Sit to supine: Mod assist, +2 for physical assistance   General bed mobility comments: vc's for technique; assist for trunk and B  LE's    Transfers Overall transfer level: Needs assistance Equipment used: 2 person hand held assist Transfers: Sit to/from Stand Sit to Stand: Mod assist, +2 physical assistance           General transfer comment: Heavy vc's and tactile cues to initiate standing (pt did better with B hand hold assist compared to when attempted with RW use); mod assist x2 to come to stand and control descent sitting; assist for standing balance d/t posterior lean at times    Ambulation/Gait Ambulation/Gait assistance: Mod assist, +2 physical assistance Gait Distance (Feet):  (3 feet forward and then 4 feet lateral stepping to R back to bed) Assistive device: 2 person hand held assist   Gait velocity: decreased     General Gait Details: short shuffling steps forwards and then sidestepping to R back to bed; assist for balance d/t posterior lean   Stairs             Wheelchair Mobility     Tilt Bed    Modified Rankin (Stroke Patients Only)       Balance Overall balance assessment: Needs assistance Sitting-balance support: Bilateral upper extremity supported, Feet supported Sitting balance-Leahy Scale: Poor Sitting balance - Comments: sitting balance fluctuating between close SBA to CGA to min assist (d/t posterior lean)   Standing balance support: Bilateral upper extremity supported, During functional activity Standing balance-Leahy Scale: Poor Standing balance comment: 2 assist for safety/balance d/t posterior lean (B hand hold assist)                            Communication Communication Communication: Impaired Factors Affecting  Communication: Difficulty expressing self  Cognition Arousal: Alert Behavior During Therapy: Flat affect, Anxious   PT - Cognitive impairments: No family/caregiver present to determine baseline                       PT - Cognition Comments: Pt oriented to name and DOB only Following commands: Impaired Following commands  impaired: Follows one step commands inconsistently, Follows one step commands with increased time    Cueing Cueing Techniques: Verbal cues, Gestural cues, Tactile cues, Visual cues  Exercises      General Comments  Nursing cleared pt for participation in physical therapy.  Pt agreeable to PT session.      Pertinent Vitals/Pain Pain Assessment Pain Assessment: Faces Pain Score:  (0/10 at rest; 4/10 intermittently during session) Pain Location: gait belt placement Pain Descriptors / Indicators: Grimacing Pain Intervention(s): Limited activity within patient's tolerance, Monitored during session, Repositioned VSS (HR and SpO2 on room air) during session.    Home Living                          Prior Function            PT Goals (current goals can now be found in the care plan section) Acute Rehab PT Goals PT Goal Formulation: Patient unable to participate in goal setting Time For Goal Achievement: 03/12/24 Potential to Achieve Goals: Fair Progress towards PT goals: Progressing toward goals    Frequency    Min 1X/week      PT Plan      Co-evaluation PT/OT/SLP Co-Evaluation/Treatment: Yes Reason for Co-Treatment: For patient/therapist safety;Necessary to address cognition/behavior during functional activity;To address functional/ADL transfers PT goals addressed during session: Mobility/safety with mobility;Balance OT goals addressed during session: ADL's and self-care      AM-PAC PT "6 Clicks" Mobility   Outcome Measure  Help needed turning from your back to your side while in a flat bed without using bedrails?: A Lot Help needed moving from lying on your back to sitting on the side of a flat bed without using bedrails?: A Lot Help needed moving to and from a bed to a chair (including a wheelchair)?: Total Help needed standing up from a chair using your arms (e.g., wheelchair or bedside chair)?: Total Help needed to walk in hospital room?: Total Help  needed climbing 3-5 steps with a railing? : Total 6 Click Score: 8    End of Session Equipment Utilized During Treatment: Gait belt Activity Tolerance: Patient tolerated treatment well Patient left: in bed;with call bell/phone within reach;with bed alarm set;Other (comment) (OT present finishing OT session) Nurse Communication: Mobility status;Precautions PT Visit Diagnosis: Unsteadiness on feet (R26.81);Difficulty in walking, not elsewhere classified (R26.2);Muscle weakness (generalized) (M62.81)     Time: 1610-9604 PT Time Calculation (min) (ACUTE ONLY): 26 min  Charges:    $Therapeutic Activity: 8-22 mins PT General Charges $$ ACUTE PT VISIT: 1 Visit                     Amador Junes, PT 02/27/24, 10:05 AM

## 2024-02-27 NOTE — Progress Notes (Signed)
  PROGRESS NOTE    Seth Thompson  [id number]:[id number] DOB: 01/05/1946 DOA: 02/11/2024 PCP: [facility]  211A/211A-AA  LOS: 16 days   Brief hospital course:   Assessment & Plan: Seth Thompson is a 78 y.o. male with medical history significant of essential hypertension, dementia, frequent PVCs, hyperlipidemia who was brought in from a facility after the patient lost consciousness while sitting on the shower chair at the facility.  He is admitted for further management and evaluation of altered mental status    Acute metabolic encephalopathy  Underlying dementia CT scan of the brain unremarkable. Patient still confused likely progressive dementia in the setting of pneumonia Normal TSH, B12 as well as folic acid levels. --Continue delirium precautions.   Community-acquired pneumonia Chest x-ray showing findings of infiltrate --Augmentin  for total 5 days.  Syncope in the setting of bradycardia-improved Upon arrival to the emergency room patient was found to be bradycardic in the 50s and had a drop of his pulse into the 20s. Echo showed EF 60 to 65% with normal diastolic parameters. Telemetry unremarkable. Avoid AV nodal blocking agents. Cardiologist advised no intervention of permanent pacemaker at this time.   Hypokalemia - resolved with replacement Monitor BMP   Incarcerated right sided inguinal hernia No surgical intervention recommended by surgery team. Continue as needed pain medication   Essential hypertension, --cont amlodipine  and losartan    Severe dementia with behavioral disturbances --cont seroquel    Hyperlipidemia -does not appear to be on statin   DVT prophylaxis: Lovenox  SQ Code Status: DNR  Family Communication:  Level of care: Med-Surg Dispo:   The patient is from: ALF Anticipated d/c is to: SNF Anticipated d/c date is: whenever bed available    Subjective and Interval History:  No change.   Objective: Vitals:   02/26/24 1945  02/27/24 0421 02/27/24 0732 02/27/24 1453  BP: 92/76 92/78 (!) 161/73 (!) 140/84  Pulse: 84 61 (!) 53 71  Resp: 18 18 17 17   Temp: 98.8 F (37.1 C) 98 F (36.7 C) 98.2 F (36.8 C) 97.7 F (36.5 C)  TempSrc: Oral Oral Oral Oral  SpO2: 96% 99% 93% 99%  Weight:      Height:        Intake/Output Summary (Last 24 hours) at 02/27/2024 1924 Last data filed at 02/27/2024 1900 Gross per 24 hour  Intake 240 ml  Output 400 ml  Net -160 ml    Filed Weights   02/11/24 0633 02/17/24 0405 02/20/24 2100  Weight: 89.9 kg 78.3 kg 79.1 kg    Examination:   Constitutional: NAD CV: No cyanosis.   RESP: normal respiratory effort, on RA Extremities: No effusions, edema in BLE SKIN: warm, dry   Data Reviewed: I have personally reviewed labs and imaging studies  Time spent: 25 minutes  Garrison Kanner, MD [facility] If 7PM-7AM, please contact night-coverage 02/27/2024, 7:24 PM

## 2024-02-27 NOTE — Progress Notes (Signed)
 Occupational Therapy Treatment Patient Details Name: Seth Thompson MRN: [id number] DOB: 19-Apr-1946 Today's Date: 02/27/2024   History of present illness Pt is a 78 year old male admitted after LOC in shower, admitted with acute metabolic encephalopathy, syncope in the setting of bradycardia, hypokalemia, incarcerated right sided inguinal hernia. PMH significant for essential hypertension, dementia, frequent PVCs, and hyperlipidemia.   OT comments  Pt is seated at EOB with PT arrival. He only grimaces in pain at times where the gait belt is placed around him during transfers/mobility. Increased time and cognitive cueing needed to initiate STS and short distance mobility. Attempted to stand at [facility], however was not successful. HHA x2 worked well for pt and heavy cognitive cues, e.g. stating "let's stand to get your milk off the counter." Pt required Mod A x2 for STS and taking a few forward steps and lateral steps to [facility] with ability to retrieve milk off the counter. Pt was noted with posterior lean intermittently while sitting EOB and standing. Mod A X2 to return to supine. Set up provided for breakfast and pt needing intermittent Min A for self feeding, cueing for sequencing-does well with finger foods, more assist with using spoon vs fork. Pt left with all needs in place and will cont to require skilled acute OT services to maximize his safety and IND to return to PLOF.       If plan is discharge home, recommend the following:  Two people to help with walking and/or transfers;Two people to help with bathing/dressing/bathroom;Supervision due to cognitive status   Equipment Recommendations  Hoyer lift;Wheelchair (measurements OT)    Recommendations for Other Services      Precautions / Restrictions Precautions Precautions: Fall Recall of Precautions/Restrictions: Impaired Restrictions Weight Bearing Restrictions Per Provider Order: No       Mobility Bed Mobility Overal bed mobility:  Needs Assistance Bed Mobility: Sit to Supine       Sit to supine: Mod assist, +2 for physical assistance   General bed mobility comments: assist for trunk and RLE, able to manage LLE on his own this date    Transfers Overall transfer level: Needs assistance Equipment used: 2 person hand held assist Transfers: Sit to/from Stand Sit to Stand: Mod assist, +2 physical assistance           General transfer comment: max cueing verbal and tactile cueing to initate and cognitively get him to stand; did not initate with RW, however did with bil HHA; Mod A x2 for safety with STS from slightly elevated bed height; psoterior lean noted at times during taking steps     Balance Overall balance assessment: Needs assistance Sitting-balance support: Bilateral upper extremity supported, Feet supported Sitting balance-Leahy Scale: Poor Sitting balance - Comments: intermittent CGA to Min A d/t posterior lean, close SBA at times as well Postural control: Posterior lean Standing balance support: Bilateral upper extremity supported, During functional activity Standing balance-Leahy Scale: Poor Standing balance comment: +2 HHA for safety, posterior lean at times                           ADL either performed or assessed with clinical judgement   ADL Overall ADL's : Needs assistance/impaired Eating/Feeding: Minimal assistance;Bed level;Cueing for sequencing;Cueing for safety Eating/Feeding Details (indicate cue type and reason): able to easily eat english muffin, but needs more cueing to utilize spoon to eat grits, pt trying to use fork instead, able to drink milk on his own with  cues to slow down Grooming: Wash/dry face;Sitting;Cueing for sequencing;Supervision/safety                                      Extremity/Trunk Insurance account manager Communication Communication: Impaired Factors Affecting  Communication: Difficulty expressing self   Cognition Arousal: Alert Behavior During Therapy: Flat affect, Anxious                                 Following commands: Impaired Following commands impaired: Follows one step commands inconsistently, Follows one step commands with increased time      Cueing   Cueing Techniques: Verbal cues, Gestural cues, Tactile cues, Visual cues  Exercises      Shoulder Instructions       General Comments      Pertinent Vitals/ Pain       Pain Assessment Pain Assessment: Faces Faces Pain Scale: Hurts little more Pain Location: gait belt placement Pain Descriptors / Indicators: Grimacing Pain Intervention(s): Limited activity within patient's tolerance, Repositioned  Home Living                                          Prior Functioning/Environment              Frequency  Min 1X/week        Progress Toward Goals  OT Goals(current goals can now be found in the care plan section)  Progress towards OT goals: Progressing toward goals  Acute Rehab OT Goals OT Goal Formulation: Patient unable to participate in goal setting Time For Goal Achievement: 03/26/24 Potential to Achieve Goals: Fair  Plan      Co-evaluation    PT/OT/SLP Co-Evaluation/Treatment: Yes Reason for Co-Treatment: For patient/therapist safety;Necessary to address cognition/behavior during functional activity;To address functional/ADL transfers PT goals addressed during session: Mobility/safety with mobility;Balance OT goals addressed during session: ADL's and self-care      AM-PAC OT "6 Clicks" Daily Activity     Outcome Measure   Help from another person eating meals?: A Little Help from another person taking care of personal grooming?: A Lot Help from another person toileting, which includes using toliet, bedpan, or urinal?: Total Help from another person bathing (including washing, rinsing, drying)?: Total Help from  another person to put on and taking off regular upper body clothing?: A Lot Help from another person to put on and taking off regular lower body clothing?: Total 6 Click Score: 10    End of Session Equipment Utilized During Treatment: Gait belt  OT Visit Diagnosis: Other abnormalities of gait and mobility (R26.89);Cognitive communication deficit (R41.841);Other symptoms and signs involving cognitive function   Activity Tolerance Patient tolerated treatment well   Patient Left in bed;with call bell/phone within reach;with bed alarm set;Other (comment) (telesitter in room)   Nurse Communication Mobility status        Time: [phone] OT Time Calculation (min): 18 min  Charges: OT General Charges $OT Visit: 1 Visit OT Treatments $Therapeutic Activity: 8-22 mins  Yessenia Maillet, OTR/L  02/27/24, 12:57 PM   Ansen Sayegh E Wavie Hashimi 02/27/2024, 12:50 PM

## 2024-02-28 DIAGNOSIS — R001 Bradycardia, unspecified: Secondary | ICD-10-CM | POA: Diagnosis not present

## 2024-02-28 NOTE — Progress Notes (Signed)
  PROGRESS NOTE    Seth Thompson  [id number] DOB: [birth year] DOA: 02/11/2024 PCP: [facility]  211A/211A-AA  LOS: 17 days   Brief hospital course:   Assessment & Plan: Seth Thompson is a 78 y.o. male with medical history significant of essential hypertension, dementia, frequent PVCs, hyperlipidemia who was brought in from a facility after the patient lost consciousness while sitting on the shower chair at the facility.  He is admitted for further management and evaluation of altered mental status    Acute metabolic encephalopathy  Underlying dementia CT scan of the brain unremarkable. Patient still confused likely progressive dementia in the setting of pneumonia Normal TSH, B12 as well as folic acid levels. --Continue delirium precautions.   Community-acquired pneumonia Chest x-ray showing findings of infiltrate --Augmentin  for total 5 days.  Syncope in the setting of bradycardia-improved Upon arrival to the emergency room patient was found to be bradycardic in the 50s and had a drop of his pulse into the 20s. Echo showed EF 60 to 65% with normal diastolic parameters. Telemetry unremarkable. Avoid AV nodal blocking agents. Cardiologist advised no intervention of permanent pacemaker at this time.   Hypokalemia - resolved with replacement Monitor BMP   Incarcerated right sided inguinal hernia No surgical intervention recommended by surgery team. Continue as needed pain medication   Essential hypertension, --cont amlodipine  and losartan    Severe dementia with behavioral disturbances --cont seroquel    Hyperlipidemia -does not appear to be on statin   DVT prophylaxis: Lovenox  SQ Code Status: DNR  Family Communication:  Level of care: Med-Surg Dispo:   The patient is from: ALF Anticipated d/c is to: SNF Anticipated d/c date is: whenever bed available    Subjective and Interval History:  Pt woke up to eat his meal.   Objective: Vitals:    02/27/24 2028 02/28/24 0507 02/28/24 0819 02/28/24 1600  BP: (!) 141/69 (!) 151/85 (!) 156/108 115/64  Pulse: 62 (!) 56 67 84  Resp: 20 20 17 16   Temp: 97.8 F (36.6 C) 98.5 F (36.9 C)  98.2 F (36.8 C)  TempSrc:    Oral  SpO2: 100% 96% 97% 92%  Weight:      Height:        Intake/Output Summary (Last 24 hours) at 02/28/2024 1938 Last data filed at 02/27/2024 2001 Gross per 24 hour  Intake 0 ml  Output --  Net 0 ml    Filed Weights   02/11/24 0633 02/17/24 0405 02/20/24 2100  Weight: 89.9 kg 78.3 kg 79.1 kg    Examination:   Constitutional: NAD, alert, oriented to self only HEENT: conjunctivae and lids normal, EOMI CV: No cyanosis.   RESP: normal respiratory effort, on RA Neuro: II - XII grossly intact.     Data Reviewed: I have personally reviewed labs and imaging studies  Time spent: 25 minutes  Seth Kanner, MD [facility] If 7PM-7AM, please contact night-coverage 02/28/2024, 7:38 PM

## 2024-02-28 NOTE — Plan of Care (Signed)

## 2024-02-29 DIAGNOSIS — R001 Bradycardia, unspecified: Secondary | ICD-10-CM | POA: Diagnosis not present

## 2024-02-29 NOTE — Progress Notes (Signed)
  PROGRESS NOTE    Seth Thompson  [id number] DOB: 1946/01/08 DOA: 02/11/2024 PCP: [facility]  211A/211A-AA  LOS: 18 days   Brief hospital course:   Assessment & Plan: Seth Thompson is a 78 y.o. male with medical history significant of essential hypertension, dementia, frequent PVCs, hyperlipidemia who was brought in from a facility after the patient lost consciousness while sitting on the shower chair at the facility.  He is admitted for further management and evaluation of altered mental status    Acute metabolic encephalopathy  Underlying dementia CT scan of the brain unremarkable. Patient still confused likely progressive dementia in the setting of pneumonia Normal TSH, B12 as well as folic acid levels. --Continue delirium precautions.   Community-acquired pneumonia Chest x-ray showing findings of infiltrate --Augmentin  for total 5 days.  Syncope in the setting of bradycardia-improved Upon arrival to the emergency room patient was found to be bradycardic in the 50s and had a drop of his pulse into the 20s. Echo showed EF 60 to 65% with normal diastolic parameters. Telemetry unremarkable. Avoid AV nodal blocking agents. Cardiologist advised no intervention of permanent pacemaker at this time.   Hypokalemia - resolved with replacement Monitor BMP   Incarcerated right sided inguinal hernia No surgical intervention recommended by surgery team. Continue as needed pain medication   Essential hypertension, --cont amlodipine  and losartan    Severe dementia with behavioral disturbances --cont seroquel    Hyperlipidemia -does not appear to be on statin   DVT prophylaxis: Lovenox  SQ Code Status: DNR  Family Communication:  Level of care: Med-Surg Dispo:   The patient is from: ALF Anticipated d/c is to: SNF Anticipated d/c date is: whenever bed available    Subjective and Interval History:  No new event.   Objective: Vitals:   02/28/24 2022  02/29/24 0431 02/29/24 0743 02/29/24 1627  BP: 133/83 114/64 (!) 163/61 112/63  Pulse:  (!) 52 (!) 51 (!) 56  Resp: 18 18 18 16   Temp:  (!) 97.5 F (36.4 C) 98.2 F (36.8 C) (!) 97.3 F (36.3 C)  TempSrc:    Oral  SpO2:  97% 97% 99%  Weight:      Height:        Intake/Output Summary (Last 24 hours) at 02/29/2024 1801 Last data filed at 02/29/2024 1355 Gross per 24 hour  Intake --  Output 400 ml  Net -400 ml    Filed Weights   02/11/24 0633 02/17/24 0405 02/20/24 2100  Weight: 89.9 kg 78.3 kg 79.1 kg    Examination:   Constitutional: NAD, alert, not oriented HEENT: conjunctivae and lids normal, EOMI CV: No cyanosis.   RESP: normal respiratory effort, on RA Neuro: II - XII grossly intact.     Data Reviewed: I have personally reviewed labs and imaging studies  Time spent: 25 minutes  Garrison Kanner, MD [facility] If 7PM-7AM, please contact night-coverage 02/29/2024, 6:01 PM

## 2024-03-01 DIAGNOSIS — R001 Bradycardia, unspecified: Secondary | ICD-10-CM | POA: Diagnosis not present

## 2024-03-01 NOTE — TOC Progression Note (Signed)
 Transition of Care [facility]) - Progression Note    Patient Details  Name: Seth Thompson MRN: [id number] Date of Birth: Jan 15, 1946  [facility] CM/SW Contact  Loman Risk, RN Phone Number: 03/01/2024, 12:09 PM  Clinical Narrative:     Message sent to Fredrik Jensen with [facility] and Abran Abrahams with [facility] to determine if there is an update on placement   Expected Discharge Plan: Skilled Nursing Facility Barriers to Discharge: Continued Medical Work up  Expected Discharge Plan and Services       Living arrangements for the past 2 months: Assisted Living Facility                                       Social Determinants of Health (SDOH) Interventions SDOH Screenings   Food Insecurity: No Food Insecurity (02/11/2024)  Housing: Low Risk  (02/11/2024)  Transportation Needs: No Transportation Needs (02/11/2024)  Utilities: Not At Risk (02/11/2024)  Social Connections: Socially Isolated (02/11/2024)  Tobacco Use: Low Risk  (02/12/2024)    Readmission Risk Interventions     No data to display

## 2024-03-01 NOTE — Progress Notes (Signed)
  PROGRESS NOTE    Seth Thompson  [id number] DOB: 07-16-1946 DOA: 02/11/2024 PCP: [facility]  211A/211A-AA  LOS: 19 days   Brief hospital course:   Assessment & Plan: Seth Thompson is a 78 y.o. male with medical history significant of essential hypertension, dementia, frequent PVCs, hyperlipidemia who was brought in from a facility after the patient lost consciousness while sitting on the shower chair at the facility.  He is admitted for further management and evaluation of altered mental status    Acute metabolic encephalopathy  Underlying dementia CT scan of the brain unremarkable. Patient still confused likely progressive dementia in the setting of pneumonia Normal TSH, B12 as well as folic acid levels. --Continue delirium precautions.   Community-acquired pneumonia Chest x-ray showing findings of infiltrate --Augmentin  for total 5 days.  Syncope in the setting of bradycardia-improved Upon arrival to the emergency room patient was found to be bradycardic in the 50s and had a drop of his pulse into the 20s. Echo showed EF 60 to 65% with normal diastolic parameters. Telemetry unremarkable. Avoid AV nodal blocking agents. Cardiologist advised no intervention of permanent pacemaker at this time.   Hypokalemia - resolved with replacement Monitor BMP   Incarcerated right sided inguinal hernia No surgical intervention recommended by surgery team. Continue as needed pain medication   Essential hypertension, --cont amlodipine  and losartan    Severe dementia with behavioral disturbances --cont seroquel    Hyperlipidemia -does not appear to be on statin   DVT prophylaxis: Lovenox  SQ Code Status: DNR  Family Communication:  Level of care: Med-Surg Dispo:   The patient is from: ALF Anticipated d/c is to: SNF Anticipated d/c date is: whenever bed available    Subjective and Interval History:  No change today.   Objective: Vitals:   02/29/24  1957 03/01/24 0408 03/01/24 0749 03/01/24 1732  BP: 115/84 108/60 129/80 (!) 135/99  Pulse: (!) 53 (!) 54 83 (!) 50  Resp: 19 19 18 18   Temp: 98.6 F (37 C) (!) 97.5 F (36.4 C) 97.9 F (36.6 C) 97.6 F (36.4 C)  TempSrc: Oral Oral    SpO2: 96% 100% 98% 100%  Weight:      Height:        Intake/Output Summary (Last 24 hours) at 03/01/2024 1903 Last data filed at 03/01/2024 0413 Gross per 24 hour  Intake --  Output 250 ml  Net -250 ml    Filed Weights   02/11/24 0633 02/17/24 0405 02/20/24 2100  Weight: 89.9 kg 78.3 kg 79.1 kg    Examination:   Constitutional: NAD CV: No cyanosis.   RESP: normal respiratory effort, on RA Extremities: No effusions, edema in BLE SKIN: warm, dry   Data Reviewed: I have personally reviewed labs and imaging studies  Time spent: 25 minutes  Garrison Kanner, MD [facility] If 7PM-7AM, please contact night-coverage 03/01/2024, 7:03 PM

## 2024-03-02 DIAGNOSIS — R001 Bradycardia, unspecified: Secondary | ICD-10-CM | POA: Diagnosis not present

## 2024-03-02 NOTE — Progress Notes (Signed)
 Physical Therapy Treatment Patient Details Name: Seth Thompson MRN: [id number] DOB: 01-24-1946 Today's Date: 03/02/2024   History of Present Illness Pt is a 78 year old male admitted after LOC in shower, admitted with acute metabolic encephalopathy, syncope in the setting of bradycardia, hypokalemia, incarcerated right sided inguinal hernia. PMH significant for essential hypertension, dementia, frequent PVCs, and hyperlipidemia.    PT Comments  Pt remained confused and disoriented but grossly able to follow simple 1-step commands more consistently albeit with max multi-modal cuing to initiate and maintain movement.  Pt required decreased physical assistance with functional tasks per below and was able to amb 2 x 75 feet with +2 HHA and chair follow for safety.  Pt was mildly unsteady in both sitting and standing but again grossly improved compared to prior sessions.  Pt will benefit from continued PT services upon discharge to safely address deficits listed in patient problem list for decreased caregiver assistance and eventual return to PLOF.     If plan is discharge home, recommend the following: Two people to help with walking and/or transfers;A lot of help with bathing/dressing/bathroom;Direct supervision/assist for medications management;Supervision due to cognitive status;Assist for transportation   Can travel by private vehicle     No  Equipment Recommendations  Other (comment) (TBD at next venue of care)    Recommendations for Other Services       Precautions / Restrictions Precautions Precautions: Fall Recall of Precautions/Restrictions: Impaired Restrictions Weight Bearing Restrictions Per Provider Order: No     Mobility  Bed Mobility Overal bed mobility: Needs Assistance Bed Mobility: Supine to Sit     Supine to sit: Min assist, HOB elevated     General bed mobility comments: Min A for trunk control and max multi-modal cuing to initiate and continue with movement  and for general sequencing    Transfers Overall transfer level: Needs assistance Equipment used: 2 person hand held assist Transfers: Sit to/from Stand Sit to Stand: Min assist, From elevated surface, Contact guard assist, +2 safety/equipment           General transfer comment: STS from lightly elevated bed height and recliner with assist ranging from Min A to CGA x2 via HHA with max multi-modal cuing to initiate movement    Ambulation/Gait Ambulation/Gait assistance: Min assist, Contact guard assist, +2 safety/equipment Gait Distance (Feet): 75 Feet Assistive device: 2 person hand held assist (with chair follow) Gait Pattern/deviations: Step-through pattern, Shuffle, Decreased step length - right, Decreased step length - left, Trunk flexed Gait velocity: decreased     General Gait Details: Very slow cadence with choppy and at times shuffling steps and occasional min A for stability with chair follow for safety   Stairs             Wheelchair Mobility     Tilt Bed    Modified Rankin (Stroke Patients Only)       Balance Overall balance assessment: Needs assistance Sitting-balance support: Bilateral upper extremity supported, Feet supported, Feet unsupported Sitting balance-Leahy Scale: Fair Sitting balance - Comments: Poor sitting balance initially with occasional min A needed to prevent posterior LOB but static sitting balance progressed to fair during the session Postural control: Posterior lean Standing balance support: Bilateral upper extremity supported, During functional activity Standing balance-Leahy Scale: Poor Standing balance comment: Occasional min A for stability during gait training  Communication Communication Communication: Impaired Factors Affecting Communication: Difficulty expressing self  Cognition Arousal: Alert Behavior During Therapy: Flat affect   PT - Cognitive impairments: No family/caregiver  present to determine baseline                         Following commands: Impaired Following commands impaired: Follows one step commands with increased time, Follows one step commands inconsistently    Cueing Cueing Techniques: Verbal cues, Gestural cues, Tactile cues, Visual cues  Exercises Total Joint Exercises Long Arc Quad: AROM, Both, 10 reps, Strengthening, 15 reps Knee Flexion: AROM, Both, 10 reps, Strengthening, 15 reps Other Exercises Other Exercises: Anterior weight shifting in sitting at the EOB    General Comments General comments (skin integrity, edema, etc.): 91% sp02 on RA and 80 HR after mobility      Pertinent Vitals/Pain Pain Assessment Pain Assessment: PAINAD Faces Pain Scale: No hurt Breathing: normal Negative Vocalization: none Facial Expression: smiling or inexpressive Body Language: relaxed Consolability: no need to console PAINAD Score: 0 Pain Intervention(s): Monitored during session    Home Living                          Prior Function            PT Goals (current goals can now be found in the care plan section) Progress towards PT goals: Progressing toward goals    Frequency    Min 1X/week      PT Plan      Co-evaluation PT/OT/SLP Co-Evaluation/Treatment: Yes Reason for Co-Treatment: For patient/therapist safety;Necessary to address cognition/behavior during functional activity;To address functional/ADL transfers PT goals addressed during session: Mobility/safety with mobility;Balance OT goals addressed during session: ADL's and self-care      AM-PAC PT "6 Clicks" Mobility   Outcome Measure  Help needed turning from your back to your side while in a flat bed without using bedrails?: A Little Help needed moving from lying on your back to sitting on the side of a flat bed without using bedrails?: A Little Help needed moving to and from a bed to a chair (including a wheelchair)?: A Little Help needed  standing up from a chair using your arms (e.g., wheelchair or bedside chair)?: A Little Help needed to walk in hospital room?: A Little Help needed climbing 3-5 steps with a railing? : Total 6 Click Score: 16    End of Session Equipment Utilized During Treatment: Gait belt Activity Tolerance: Patient tolerated treatment well Patient left: in chair;with call bell/phone within reach;with chair alarm set;with nursing/sitter in room Nurse Communication: Mobility status PT Visit Diagnosis: Unsteadiness on feet (R26.81);Difficulty in walking, not elsewhere classified (R26.2);Muscle weakness (generalized) (M62.81)     Time: 1610-9604 PT Time Calculation (min) (ACUTE ONLY): 26 min  Charges:    $Gait Training: 8-22 mins PT General Charges $$ ACUTE PT VISIT: 1 Visit                    D. Scott Tmya Wigington PT, DPT 03/02/24, 4:00 PM

## 2024-03-02 NOTE — Progress Notes (Signed)
 Mobility Specialist - Progress Note   03/02/24 1501  Mobility  Activity Transferred from chair to bed  Level of Assistance Minimal assist, patient does 75% or more  Assistive Device  (BUE HHA +2)  Distance Ambulated (ft) 4 ft  Activity Response Tolerated well  Mobility visit 1 Mobility  Mobility Specialist Start Time (ACUTE ONLY) 1436  Mobility Specialist Stop Time (ACUTE ONLY) 1445  Mobility Specialist Time Calculation (min) (ACUTE ONLY) 9 min   MS responding to chair alarm. Pt sitting in the recliner upon entry, utilizing RA--- becoming increasingly irritable/fidgety in the recliner. Pt STS with BUE HHA +2 for support, and transferred to bed taking a few lateral and backward steps toward the [facility]. Pt left in fowler position, with alarm set and needs within reach.  Versa Gore Mobility Specialist 03/02/24 3:09 PM

## 2024-03-02 NOTE — TOC Progression Note (Signed)
 Transition of Care [facility]) - Progression Note    Patient Details  Name: CARTRELL BENTSEN MRN: [id number] Date of Birth: April 25, 1946  Transition of Care [facility] CM/SW Contact  Loman Risk, RN Phone Number: 03/02/2024, 10:58 AM  Clinical Narrative:     Per Abran Abrahams with [employer] they have sent emails to the attorney to discuss payment.  She states that their Business office manager will be back in office on Wednesday    Expected Discharge Plan: Skilled Nursing Facility Barriers to Discharge: Continued Medical Work up  Expected Discharge Plan and Services       Living arrangements for the past 2 months: Assisted Living Facility                                       Social Determinants of Health (SDOH) Interventions SDOH Screenings   Food Insecurity: No Food Insecurity (02/11/2024)  Housing: Low Risk  (02/11/2024)  Transportation Needs: No Transportation Needs (02/11/2024)  Utilities: Not At Risk (02/11/2024)  Social Connections: Socially Isolated (02/11/2024)  Tobacco Use: Low Risk  (02/12/2024)    Readmission Risk Interventions     No data to display

## 2024-03-02 NOTE — Progress Notes (Signed)
 Occupational Therapy Treatment Patient Details Name: Seth Thompson MRN: [id number] DOB: 1945-10-23 Today's Date: 03/02/2024   History of present illness Pt is a 78 year old male admitted after LOC in shower, admitted with acute metabolic encephalopathy, syncope in the setting of bradycardia, hypokalemia, incarcerated right sided inguinal hernia. PMH significant for essential hypertension, dementia, frequent PVCs, and hyperlipidemia.   OT comments  Pt is supine in bed on arrival. Easily arousable and agreeable to PT/OT co-treatment session to progress pt's mobility/OOB activity safely. He does not appear to have pain this session. Pt performed bed mobility to reach EOB with Min A x1 for trunkal assist. Varying levels of assist for seated balance at EOB-occasional CGA to Min A d/t posterior lean requiring cueing to correct. Pt performed STS from EOB and recliner with Min to CGA x2 for safety via HHA and progressed to ambulating two bouts of 75 feet with Min to CGA x2 via HHA and close chair follow. Pt followed one step commands more consistently this date and was left seated in recliner with tele sitter in place. He needed Min A to initiate self feeding tasks seated in recliner and assist to cut up his food. He was left with all needs in place and will cont to require skilled acute OT services to maximize his safety and IND to return to PLOF.       If plan is discharge home, recommend the following:  Two people to help with walking and/or transfers;Two people to help with bathing/dressing/bathroom;Supervision due to cognitive status   Equipment Recommendations  Hoyer lift;Wheelchair (measurements OT)    Recommendations for Other Services      Precautions / Restrictions Precautions Precautions: Fall Recall of Precautions/Restrictions: Impaired Restrictions Weight Bearing Restrictions Per Provider Order: No       Mobility Bed Mobility Overal bed mobility: Needs Assistance Bed  Mobility: Supine to Sit     Supine to sit: Min assist, HOB elevated     General bed mobility comments: from semi-supine position, manages LLE on his own    Transfers Overall transfer level: Needs assistance Equipment used: 2 person hand held assist Transfers: Sit to/from Stand Sit to Stand: Min assist, From elevated surface, Contact guard assist, +2 safety/equipment           General transfer comment: from slightly elevated bed height and recliner ranging from Min A to CGA x2 via HHA for STS and bouts of ambulation     Balance Overall balance assessment: Needs assistance Sitting-balance support: Bilateral upper extremity supported, Feet supported, Feet unsupported Sitting balance-Leahy Scale: Fair Sitting balance - Comments: occasional min to CGA for seated balance at EOB with occ posterior lean requiring cueing for correction   Standing balance support: Bilateral upper extremity supported, During functional activity Standing balance-Leahy Scale: Fair Standing balance comment: HHA x2 for safety with STS and ambulation trials                           ADL either performed or assessed with clinical judgement   ADL Overall ADL's : Needs assistance/impaired Eating/Feeding: Minimal assistance;Cueing for sequencing;Cueing for safety;Sitting Eating/Feeding Details (indicate cue type and reason): sitting up in chair, able to eat once provided cognitive cueing for initation, needs intermittent cueing to stay on task; will use fork vs spoon, etc.  Extremity/Trunk Assessment              Occupational psychologist Communication: Impaired Factors Affecting Communication: Difficulty expressing self   Cognition Arousal: Alert Behavior During Therapy: Flat affect, Anxious               OT - Cognition Comments: followed one step directions with increased time  and more accuracy this date                 Following commands: Impaired Following commands impaired: Follows one step commands with increased time      Cueing   Cueing Techniques: Verbal cues, Gestural cues, Tactile cues, Visual cues  Exercises      Shoulder Instructions       General Comments 91% sp02 on RA and 80 HR after mobility    Pertinent Vitals/ Pain       Pain Assessment Pain Assessment: Faces Faces Pain Scale: No hurt Pain Intervention(s): Monitored during session  Home Living                                          Prior Functioning/Environment              Frequency  Min 1X/week        Progress Toward Goals  OT Goals(current goals can now be found in the care plan section)  Progress towards OT goals: Progressing toward goals  Acute Rehab OT Goals OT Goal Formulation: Patient unable to participate in goal setting Time For Goal Achievement: 03/26/24 Potential to Achieve Goals: Fair  Plan      Co-evaluation    PT/OT/SLP Co-Evaluation/Treatment: Yes Reason for Co-Treatment: For patient/therapist safety;Necessary to address cognition/behavior during functional activity;To address functional/ADL transfers PT goals addressed during session: Mobility/safety with mobility;Balance OT goals addressed during session: ADL's and self-care      AM-PAC OT "6 Clicks" Daily Activity     Outcome Measure   Help from another person eating meals?: A Little Help from another person taking care of personal grooming?: A Lot Help from another person toileting, which includes using toliet, bedpan, or urinal?: Total Help from another person bathing (including washing, rinsing, drying)?: A Lot Help from another person to put on and taking off regular upper body clothing?: A Lot Help from another person to put on and taking off regular lower body clothing?: A Lot 6 Click Score: 12    End of Session Equipment Utilized During Treatment:  Gait belt  OT Visit Diagnosis: Other abnormalities of gait and mobility (R26.89);Cognitive communication deficit (R41.841);Other symptoms and signs involving cognitive function   Activity Tolerance Patient tolerated treatment well   Patient Left with call bell/phone within reach;Other (comment);in chair;with chair alarm set Chiropractor)   Nurse Communication Mobility status        Time: [phone] OT Time Calculation (min): 26 min  Charges: OT General Charges $OT Visit: 1 Visit OT Treatments $Therapeutic Activity: 8-22 mins  Rashawnda Gaba, OTR/L  03/02/24, 3:33 PM   Ceonna Frazzini E Garek Schuneman 03/02/2024, 3:30 PM

## 2024-03-02 NOTE — Progress Notes (Signed)
  PROGRESS NOTE    Seth Thompson  NWG:[id number] DOB: 04-Nov-1945 DOA: 02/11/2024 PCP: [facility]  211A/211A-AA  LOS: 20 days   Brief hospital course:   Assessment & Plan: Seth Thompson is a 78 y.o. male with medical history significant of essential hypertension, dementia, frequent PVCs, hyperlipidemia who was brought in from a facility after the patient lost consciousness while sitting on the shower chair at the facility.  He is admitted for further management and evaluation of altered mental status    Acute metabolic encephalopathy  Underlying dementia CT scan of the brain unremarkable. Patient still confused likely progressive dementia in the setting of pneumonia Normal TSH, B12 as well as folic acid levels. --Continue delirium precautions.   Community-acquired pneumonia Chest x-ray showing findings of infiltrate --received Augmentin  for total 5 days.  Syncope in the setting of bradycardia-improved Upon arrival to the emergency room patient was found to be bradycardic in the 50s and had a drop of his pulse into the 20s. Echo showed EF 60 to 65% with normal diastolic parameters. Telemetry unremarkable. Avoid AV nodal blocking agents. Cardiologist advised no intervention of permanent pacemaker at this time.   Hypokalemia - resolved with replacement   Incarcerated right sided inguinal hernia No surgical intervention recommended by surgery team. Continue as needed pain medication   Essential hypertension, --cont amlodipine  and losartan    Severe dementia with behavioral disturbances --cont seroquel    Hyperlipidemia -does not appear to be on statin   DVT prophylaxis: Lovenox  SQ Code Status: DNR  Family Communication:  Level of care: Med-Surg Dispo:   The patient is from: ALF Anticipated d/c is to: SNF Anticipated d/c date is: whenever bed available    Subjective and Interval History:  Pt was seen walking in the halls with  PT.   Objective: Vitals:   03/01/24 1917 03/02/24 0315 03/02/24 0906 03/02/24 1547  BP: (!) 120/100 124/67 100/68 (!) 106/58  Pulse:  65 67   Resp: 20 20 18 18   Temp: 97.7 F (36.5 C) 100.1 F (37.8 C) 98.5 F (36.9 C) (!) 97.4 F (36.3 C)  TempSrc: Oral Oral  Oral  SpO2: 100% 100% 98% 100%  Weight:      Height:        Intake/Output Summary (Last 24 hours) at 03/02/2024 1908 Last data filed at 03/02/2024 0800 Gross per 24 hour  Intake 200 ml  Output 350 ml  Net -150 ml    Filed Weights   02/11/24 0633 02/17/24 0405 02/20/24 2100  Weight: 89.9 kg 78.3 kg 79.1 kg    Examination:   Constitutional: NAD, alert, oriented to self HEENT: conjunctivae and lids normal, EOMI CV: No cyanosis.   RESP: normal respiratory effort, on RA Neuro: II - XII grossly intact.     Data Reviewed: I have personally reviewed labs and imaging studies  Time spent: 25 minutes  Seth Kanner, MD [facility] If 7PM-7AM, please contact night-coverage 03/02/2024, 7:08 PM

## 2024-03-02 NOTE — Plan of Care (Signed)
  Problem: Safety: Goal: Ability to remain free from injury will improve Outcome: Progressing   Problem: Education: Goal: Knowledge of General Education information will improve Description: Including pain rating scale, medication(s)/side effects and non-pharmacologic comfort measures Outcome: Not Progressing   Problem: Health Behavior/Discharge Planning: Goal: Ability to manage health-related needs will improve Outcome: Not Progressing   Problem: Activity: Goal: Risk for activity intolerance will decrease Outcome: Not Progressing

## 2024-03-03 DIAGNOSIS — F0280 Dementia in other diseases classified elsewhere without behavioral disturbance: Secondary | ICD-10-CM | POA: Diagnosis not present

## 2024-03-03 DIAGNOSIS — R001 Bradycardia, unspecified: Secondary | ICD-10-CM | POA: Diagnosis not present

## 2024-03-03 DIAGNOSIS — G309 Alzheimer's disease, unspecified: Secondary | ICD-10-CM | POA: Diagnosis not present

## 2024-03-03 DIAGNOSIS — K4030 Unilateral inguinal hernia, with obstruction, without gangrene, not specified as recurrent: Secondary | ICD-10-CM | POA: Diagnosis not present

## 2024-03-03 NOTE — Plan of Care (Signed)
  Problem: Activity: Goal: Risk for activity intolerance will decrease Outcome: Progressing   Problem: Nutrition: Goal: Adequate nutrition will be maintained Outcome: Progressing   Problem: Education: Goal: Knowledge of General Education information will improve Description: Including pain rating scale, medication(s)/side effects and non-pharmacologic comfort measures Outcome: Not Progressing   Problem: Health Behavior/Discharge Planning: Goal: Ability to manage health-related needs will improve Outcome: Not Progressing   Problem: Coping: Goal: Level of anxiety will decrease Outcome: Not Progressing

## 2024-03-03 NOTE — Hospital Course (Addendum)
    Hospital course / significant events:  Seth Thompson is a 78 y.o. male with medical history significant of essential hypertension, dementia, frequent PVCs, hyperlipidemia who was brought in from a facility after the patient lost consciousness while sitting on the shower chair at the facility.  He is admitted for further management and evaluation of altered mental status. Tx pneumonia, also severe bradycardia likely cause for syncope, but not good candidate for pacer. Upon arrival to the emergency room patient was found to be bradycardic in the 50s and had a drop of his pulse into the 20s.Echo showed EF 60 to 65% with normal diastolic parameters.Telemetry unremarkable. Currently financials are holding up dc to facility, pt is not safe to live alone      Consultants:  Cardiology General surgery  Palliative care  Procedures/Surgeries: none      ASSESSMENT & PLAN:   Acute metabolic encephalopathy on chronic cognitive impairment - now at baseline Severe dementia with behavioral disturbances Sun-downing seroquel  50 mg nightly Haldol  IM PRN  Syncope in the setting of bradycardia-improved Avoid AV nodal blocking agents. Cardiologist advised no intervention of permanent pacemaker at this time.  Community-acquired pneumonia - resolved received Augmentin  for total 5 days. Recheck as needed  Hypokalemia Replace as needed Monitor BMP periodically   Incarcerated right sided inguinal hernia No surgical intervention recommended by surgery team. Continue as needed pain medication   Essential hypertension, cont amlodipine  and losartan    Hyperlipidemia No statin    overweight based on BMI: Body mass index is 25.02 kg/m.[name] Significantly low or high BMI is associated with higher medical risk.  Underweight - under 18  overweight - 25 to 29 obese - 30 or more Class 1 obesity: BMI of 30.0 to 34 Class 2 obesity: BMI of 35.0 to 39 Class 3 obesity: BMI of 40.0 to 49 Super  Morbid Obesity: BMI 50-59 Super-super Morbid Obesity: BMI 60+ Healthy nutrition and physical activity advised as adjunct to other disease management and risk reduction treatments    DVT prophylaxis: lovenox  IV fluids: no continuous IV fluids  Nutrition: regular Central lines / other devices: none  Code Status: DNR ACP documentation reviewed: has DNR order on file in [facility]  [facility] needs: placement Medical barriers to dispo: none.

## 2024-03-03 NOTE — TOC Progression Note (Signed)
 Transition of Care [facility]) - Progression Note    Patient Details  Name: OZ GAMMEL MRN: [id number] Date of Birth: [birth year]  Transition of Care [facility] CM/SW Contact  Loman Risk, RN Phone Number: 03/03/2024, 3:25 PM  Clinical Narrative:     Per Abran Abrahams at [facility] paperwork has been sent to DSS and attorney to be completed and once paperwork is completed patient can be admitted with hospice services . Fredrik Jensen with DSS aware   Expected Discharge Plan: Skilled Nursing Facility Barriers to Discharge: Continued Medical Work up  Expected Discharge Plan and Services       Living arrangements for the past 2 months: Assisted Living Facility                                       Social Determinants of Health (SDOH) Interventions SDOH Screenings   Food Insecurity: No Food Insecurity (02/11/2024)  Housing: Low Risk  (02/11/2024)  Transportation Needs: No Transportation Needs (02/11/2024)  Utilities: Not At Risk (02/11/2024)  Social Connections: Socially Isolated (02/11/2024)  Tobacco Use: Low Risk  (02/12/2024)    Readmission Risk Interventions     No data to display

## 2024-03-03 NOTE — Progress Notes (Signed)
  Progress Note   Patient: Seth Thompson:[id number] DOB: [birth year] DOA: 02/11/2024     21 DOS: the patient was seen and examined on 03/03/2024   Brief hospital course: Seth Thompson is a 78 y.o. male with medical history significant of essential hypertension, dementia, frequent PVCs, hyperlipidemia who was brought in from a facility after the patient lost consciousness while sitting on the shower chair at the facility.  He is admitted for further management and evaluation of altered mental status    Principal Problem:   Bradycardia with less than 30 beats per minute Active Problems:   Incarcerated right inguinal hernia   Syncope   Alzheimer's disease (HCC)   Assessment and Plan: Acute metabolic encephalopathy  Severe dementia with behavioral disturbances CT scan of the brain unremarkable. Patient still confused likely progressive dementia in the setting of pneumonia Normal TSH, B12 as well as folic acid levels. Patient is treated with Seroquel  every evening, also added melatonin. Currently, patient is pending placement.   Community-acquired pneumonia Chest x-ray showing findings of infiltrate --received Augmentin  for total 5 days.  Syncope in the setting of bradycardia-improved Upon arrival to the emergency room patient was found to be bradycardic in the 50s and had a drop of his pulse into the 20s. Echo showed EF 60 to 65% with normal diastolic parameters. Telemetry unremarkable. Avoid AV nodal blocking agents. Cardiologist advised no intervention of permanent pacemaker at this time.   Hypokalemia - resolved with replacement   Incarcerated right sided inguinal hernia No surgical intervention recommended by surgery team. Continue as needed pain medication   Essential hypertension, --cont amlodipine  and losartan     --cont seroquel    Hyperlipidemia -does not appear to be on statin       Subjective:  Patient is very confused as at baseline.  Physical  Exam: Vitals:   03/02/24 1547 03/02/24 2001 03/03/24 0326 03/03/24 0724  BP: (!) 106/58 123/67 124/68 120/85  Pulse:  (!) 57 60 79  Resp: 18 16 16 18   Temp: (!) 97.4 F (36.3 C) 98.8 F (37.1 C) 98.6 F (37 C) 97.7 F (36.5 C)  TempSrc: Oral Oral    SpO2: 100% 90% 90% 97%  Weight:      Height:       General exam: Appears calm and comfortable  Respiratory system: Clear to auscultation. Respiratory effort normal. Cardiovascular system: S1 & S2 heard, RRR. No JVD, murmurs, rubs, gallops or clicks. No pedal edema. Gastrointestinal system: Abdomen is nondistended, soft and nontender. No organomegaly or masses felt. Normal bowel sounds heard. Central nervous system: Alert and oriented x1. No focal neurological deficits. Extremities: Symmetric 5 x 5 power. Skin: No rashes, lesions or ulcers Psychiatry: Flat affect.   Data Reviewed:  Lab results reviewed.  Family Communication: None  Disposition: Status is: Inpatient Remains inpatient appropriate because: Unsafe discharge, pending nursing home placement.     Time spent: 35 minutes  Author: Donaciano Frizzle, MD 03/03/2024 12:05 PM  For on call review [email or web address].

## 2024-03-04 DIAGNOSIS — K4030 Unilateral inguinal hernia, with obstruction, without gangrene, not specified as recurrent: Secondary | ICD-10-CM | POA: Diagnosis not present

## 2024-03-04 DIAGNOSIS — R001 Bradycardia, unspecified: Secondary | ICD-10-CM | POA: Diagnosis not present

## 2024-03-04 DIAGNOSIS — G309 Alzheimer's disease, unspecified: Secondary | ICD-10-CM | POA: Diagnosis not present

## 2024-03-04 DIAGNOSIS — F0280 Dementia in other diseases classified elsewhere without behavioral disturbance: Secondary | ICD-10-CM | POA: Diagnosis not present

## 2024-03-04 NOTE — Progress Notes (Signed)
  Progress Note   Patient: Seth Thompson [id number]:[id number] DOB: [birth year] DOA: 02/11/2024     22 DOS: the patient was seen and examined on 03/04/2024   Brief hospital course: GILLES TRIMPE is a 78 y.o. male with medical history significant of essential hypertension, dementia, frequent PVCs, hyperlipidemia who was brought in from a facility after the patient lost consciousness while sitting on the shower chair at the facility.  He is admitted for further management and evaluation of altered mental status    Principal Problem:   Bradycardia with less than 30 beats per minute Active Problems:   Incarcerated right inguinal hernia   Syncope   Alzheimer's disease (HCC)   Assessment and Plan: Acute metabolic encephalopathy  Severe dementia with behavioral disturbances CT scan of the brain unremarkable. Patient still confused likely progressive dementia in the setting of pneumonia Normal TSH, B12 as well as folic acid levels. Patient is treated with Seroquel  every evening, also added melatonin. Currently, patient is pending placement.   Community-acquired pneumonia Chest x-ray showing findings of infiltrate --received Augmentin  for total 5 days.  Syncope in the setting of bradycardia-improved Upon arrival to the emergency room patient was found to be bradycardic in the 50s and had a drop of his pulse into the 20s. Echo showed EF 60 to 65% with normal diastolic parameters. Telemetry unremarkable. Avoid AV nodal blocking agents. Cardiologist advised no intervention of permanent pacemaker at this time.   Hypokalemia - resolved with replacement   Incarcerated right sided inguinal hernia No surgical intervention recommended by surgery team. Continue as needed pain medication   Essential hypertension, --cont amlodipine  and losartan      --cont seroquel    Hyperlipidemia -does not appear to be on statin    Patient condition is relatively stable, has poor prognosis and followed  by palliative care.  But no treatment changes.  Pending placement.    Subjective:  Patient is confused.  Per RN, patient was intermittently agitated, pulling on the lines. Patient required feeding, last bowel recorded yesterday.  Physical Exam: Vitals:   03/03/24 1615 03/03/24 1943 03/04/24 0551 03/04/24 0825  BP: (!) 112/94 (!) 128/99 (!) 150/113 (!) 145/93  Pulse: 93 (!) 59 (!) 102 (!) 49  Resp: 18 18 18 18   Temp:  98.2 F (36.8 C) 98.3 F (36.8 C) 97.9 F (36.6 C)  TempSrc:  Oral Axillary   SpO2: (!) 83% 98% 93%   Weight:      Height:       General exam: Appears calm and comfortable  Respiratory system: Clear to auscultation. Respiratory effort normal. Cardiovascular system: S1 & S2 heard, RRR. No JVD, murmurs, rubs, gallops or clicks. No pedal edema. Gastrointestinal system: Abdomen is nondistended, soft and nontender. No organomegaly or masses felt. Normal bowel sounds heard. Central nervous system: Alert and oriented x1. No focal neurological deficits. Extremities: Symmetric 5 x 5 power. Skin: No rashes, lesions or ulcers Psychiatry: Flat affect.   Data Reviewed:  There are no new results to review at this time.  Family Communication: None  Disposition: Status is: Inpatient Remains inpatient appropriate because: Unsafe discharge option.     Time spent: 35 minutes  Author: Donaciano Frizzle, MD 03/04/2024 10:45 AM  For on call review [email or web address].

## 2024-03-04 NOTE — Plan of Care (Signed)

## 2024-03-04 NOTE — TOC Progression Note (Signed)
 Transition of Care [facility]) - Progression Note    Patient Details  Name: Seth Thompson MRN: [id number] Date of Birth: 08-12-1946  [facility]) CM/SW Contact  Odilia Bennett, LCSW Phone Number: 03/04/2024, 10:45 AM  Clinical Narrative:  [facility] is waiting on paperwork completion by DSS and attorney.   Expected Discharge Plan: Skilled Nursing Facility Barriers to Discharge: Continued Medical Work up  Expected Discharge Plan and Services       Living arrangements for the past 2 months: Assisted Living Facility                                       Social Determinants of Health (SDOH) Interventions SDOH Screenings   Food Insecurity: No Food Insecurity (02/11/2024)  Housing: Low Risk  (02/11/2024)  Transportation Needs: No Transportation Needs (02/11/2024)  Utilities: Not At Risk (02/11/2024)  Social Connections: Socially Isolated (02/11/2024)  Tobacco Use: Low Risk  (02/12/2024)    Readmission Risk Interventions     No data to display

## 2024-03-04 NOTE — Plan of Care (Signed)
   Problem: Education: Goal: Knowledge of General Education information will improve Description: Including pain rating scale, medication(s)/side effects and non-pharmacologic comfort measures Outcome: Not Progressing   Problem: Health Behavior/Discharge Planning: Goal: Ability to manage health-related needs will improve Outcome: Not Progressing   Problem: Activity: Goal: Risk for activity intolerance will decrease Outcome: Not Progressing

## 2024-03-05 DIAGNOSIS — G309 Alzheimer's disease, unspecified: Secondary | ICD-10-CM | POA: Diagnosis not present

## 2024-03-05 DIAGNOSIS — F0280 Dementia in other diseases classified elsewhere without behavioral disturbance: Secondary | ICD-10-CM | POA: Diagnosis not present

## 2024-03-05 DIAGNOSIS — I10 Essential (primary) hypertension: Secondary | ICD-10-CM | POA: Diagnosis not present

## 2024-03-05 DIAGNOSIS — R001 Bradycardia, unspecified: Secondary | ICD-10-CM | POA: Diagnosis not present

## 2024-03-05 DIAGNOSIS — K4030 Unilateral inguinal hernia, with obstruction, without gangrene, not specified as recurrent: Secondary | ICD-10-CM | POA: Diagnosis not present

## 2024-03-05 DIAGNOSIS — F411 Generalized anxiety disorder: Secondary | ICD-10-CM | POA: Diagnosis not present

## 2024-03-05 MED ORDER — HALOPERIDOL LACTATE 5 MG/ML IJ SOLN
2.0000 mg | Freq: Four times a day (QID) | INTRAMUSCULAR | Status: DC | PRN
  Filled 2024-03-05: qty 1

## 2024-03-05 NOTE — Progress Notes (Signed)
 Physical Therapy Treatment Patient Details Name: Seth Thompson MRN: [id number] DOB: 04-May-1946 Today's Date: 03/05/2024   History of Present Illness Pt is a 78 year old male admitted after LOC in shower, admitted with acute metabolic encephalopathy, syncope in the setting of bradycardia, hypokalemia, incarcerated right sided inguinal hernia. PMH significant for essential hypertension, dementia, frequent PVCs, and hyperlipidemia.    PT Comments  Pt resting in bed upon therapy arrival; PT/OT co-treatment performed.  During session pt was SBA semi-supine to sitting EOB; CGA x2 to stand up from bed; and min assist x1 (plus 2nd assist on pt's L side for safety and chair follow) to ambulate around nursing loop with hand hold assist.  Pt requiring extra time and modified cueing for activities; pt inconsistent with following cues; pt pleasant and participatory during session.  Will continue to focus on strengthening, balance, and progressive functional mobility during hospitalization.    If plan is discharge home, recommend the following: Two people to help with walking and/or transfers;A lot of help with bathing/dressing/bathroom;Direct supervision/assist for medications management;Supervision due to cognitive status;Assist for transportation   Can travel by private vehicle        Equipment Recommendations  Other (comment) (TBD at next venue of care)    Recommendations for Other Services       Precautions / Restrictions Precautions Precautions: Fall Recall of Precautions/Restrictions: Impaired Restrictions Weight Bearing Restrictions Per Provider Order: No     Mobility  Bed Mobility Overal bed mobility: Needs Assistance Bed Mobility: Supine to Sit     Supine to sit: Supervision, HOB elevated     General bed mobility comments: increased time to perform on own; vc's to get OOB    Transfers Overall transfer level: Needs assistance Equipment used: 2 person hand held  assist Transfers: Sit to/from Stand Sit to Stand: Contact guard assist, +2 physical assistance           General transfer comment: CGA x2 to stand from bed with B hand hold assist    Ambulation/Gait Ambulation/Gait assistance: Min assist, Supervision, +2 physical assistance, +2 safety/equipment Gait Distance (Feet): 180 Feet Assistive device: 1 person hand held assist, 2 person hand held assist (with chair follow) Gait Pattern/deviations: Step-through pattern, Shuffle, Decreased step length - right, Decreased step length - left, Trunk flexed Gait velocity: decreased     General Gait Details: assist to steady; pt leaning more towards L side (hand hold assist on R side most of ambulation except B hand hold assist last 10 feet to walk to chair); short choppy steps; chair follow for safety; 2nd assist on pt's L side for safety   Stairs             Wheelchair Mobility     Tilt Bed    Modified Rankin (Stroke Patients Only)       Balance Overall balance assessment: Needs assistance Sitting-balance support: Bilateral upper extremity supported, Feet supported Sitting balance-Leahy Scale: Fair Sitting balance - Comments: steady static sitting   Standing balance support: Bilateral upper extremity supported, During functional activity Standing balance-Leahy Scale: Poor Standing balance comment: assist to steady with ambulation                            Communication Communication Communication: Impaired Factors Affecting Communication: Difficulty expressing self  Cognition Arousal: Alert Behavior During Therapy: Flat affect   PT - Cognitive impairments: No family/caregiver present to determine baseline  Following commands: Impaired Following commands impaired: Follows one step commands with increased time, Follows one step commands inconsistently    Cueing Cueing Techniques: Verbal cues, Gestural cues, Tactile cues,  Visual cues  Exercises      General Comments  Nursing cleared pt for participation in physical therapy.  Pt agreeable to PT session.      Pertinent Vitals/Pain Pain Assessment Pain Assessment: PAINAD Breathing: normal Negative Vocalization: none Facial Expression: smiling or inexpressive Body Language: relaxed Consolability: no need to console PAINAD Score: 0 Pain Intervention(s): Limited activity within patient's tolerance, Monitored during session    Home Living                          Prior Function            PT Goals (current goals can now be found in the care plan section) Acute Rehab PT Goals PT Goal Formulation: Patient unable to participate in goal setting Time For Goal Achievement: 03/12/24 Potential to Achieve Goals: Fair Progress towards PT goals: Progressing toward goals    Frequency    Min 1X/week      PT Plan      Co-evaluation PT/OT/SLP Co-Evaluation/Treatment: Yes Reason for Co-Treatment: For patient/therapist safety;Necessary to address cognition/behavior during functional activity;To address functional/ADL transfers PT goals addressed during session: Mobility/safety with mobility;Balance OT goals addressed during session: ADL's and self-care      AM-PAC PT "6 Clicks" Mobility   Outcome Measure  Help needed turning from your back to your side while in a flat bed without using bedrails?: A Little Help needed moving from lying on your back to sitting on the side of a flat bed without using bedrails?: A Little Help needed moving to and from a bed to a chair (including a wheelchair)?: A Little Help needed standing up from a chair using your arms (e.g., wheelchair or bedside chair)?: A Little Help needed to walk in hospital room?: A Little Help needed climbing 3-5 steps with a railing? : A Lot 6 Click Score: 17    End of Session Equipment Utilized During Treatment: Gait belt Activity Tolerance: Patient tolerated treatment  well Patient left: in chair;with call bell/phone within reach;with chair alarm set;Other (comment) (Tele-sitter in place, fall mat in place, tray table in front of pt) Nurse Communication: Mobility status;Precautions;Other (comment) (pt currently in recliner with chair alarm on) PT Visit Diagnosis: Unsteadiness on feet (R26.81);Difficulty in walking, not elsewhere classified (R26.2);Muscle weakness (generalized) (M62.81)     Time: [id number] PT Time Calculation (min) (ACUTE ONLY): 23 min  Charges:    $Gait Training: 8-22 mins PT General Charges $$ ACUTE PT VISIT: 1 Visit                     Amador Junes, PT 03/05/24, 12:05 PM

## 2024-03-05 NOTE — Progress Notes (Signed)
  Progress Note   Patient: Seth Thompson RCV:[id number] DOB: Nov 23, 1945 DOA: 02/11/2024     23 DOS: the patient was seen and examined on 03/05/2024   Brief hospital course: ARIK HUSMANN is a 78 y.o. male with medical history significant of essential hypertension, dementia, frequent PVCs, hyperlipidemia who was brought in from a facility after the patient lost consciousness while sitting on the shower chair at the facility.  He is admitted for further management and evaluation of altered mental status    Principal Problem:   Bradycardia with less than 30 beats per minute Active Problems:   Incarcerated right inguinal hernia   Syncope   Alzheimer's disease (HCC)   Assessment and Plan: Acute metabolic encephalopathy  Severe dementia with behavioral disturbances CT scan of the brain unremarkable. Patient still confused likely progressive dementia in the setting of pneumonia Normal TSH, B12 as well as folic acid levels. Patient is treated with Seroquel  every evening, also added melatonin. Currently, patient is pending placement.   Community-acquired pneumonia Chest x-ray showing findings of infiltrate --received Augmentin  for total 5 days.  Syncope in the setting of bradycardia-improved Upon arrival to the emergency room patient was found to be bradycardic in the 50s and had a drop of his pulse into the 20s. Echo showed EF 60 to 65% with normal diastolic parameters. Telemetry unremarkable. Avoid AV nodal blocking agents. Cardiologist advised no intervention of permanent pacemaker at this time.   Hypokalemia - resolved with replacement   Incarcerated right sided inguinal hernia No surgical intervention recommended by surgery team. Continue as needed pain medication   Essential hypertension, --cont amlodipine  and losartan    Hyperlipidemia -does not appear to be on statin   No change in treatment plan, pending placement.    Subjective:  Patient slept well last  night per sitter, no agitation today.  Physical Exam: Vitals:   03/04/24 1620 03/04/24 2026 03/05/24 0409 03/05/24 0749  BP: (!) 109/58 132/69 (!) 154/67 131/74  Pulse: (!) 56 (!) 55 (!) 54 67  Resp: 16 20 20 16   Temp: 97.8 F (36.6 C) 98.5 F (36.9 C) (!) 97.5 F (36.4 C) 97.8 F (36.6 C)  TempSrc: Oral Oral Oral Oral  SpO2: 98% 98% 97% 95%  Weight:      Height:       General exam: Appears calm and comfortable  Respiratory system: Clear to auscultation. Respiratory effort normal. Cardiovascular system: S1 & S2 heard, RRR. No JVD, murmurs, rubs, gallops or clicks. No pedal edema. Gastrointestinal system: Abdomen is nondistended, soft and nontender. No organomegaly or masses felt. Normal bowel sounds heard. Central nervous system: Alert and oriented x1. No focal neurological deficits. Extremities: Symmetric 5 x 5 power. Skin: No rashes, lesions or ulcers Psychiatry: Flat affect   Data Reviewed:  There are no new results to review at this time.  Family Communication: None  Disposition: Status is: Inpatient Remains inpatient appropriate because: Unsafe discharge option.     Time spent: 25 minutes  Author: Donaciano Frizzle, MD 03/05/2024 11:04 AM  For on call review [email or web address].

## 2024-03-05 NOTE — Progress Notes (Signed)
 Occupational Therapy Treatment Patient Details Name: Seth Thompson MRN: [id number] DOB: [birth year] Today's Date: 03/05/2024   History of present illness Pt is a 78 year old male admitted after LOC in shower, admitted with acute metabolic encephalopathy, syncope in the setting of bradycardia, hypokalemia, incarcerated right sided inguinal hernia. PMH significant for essential hypertension, dementia, frequent PVCs, and hyperlipidemia.   OT comments  Pt is supine in bed on arrival. Pleasant and agreeable to PT/OT co-tx session to maximize pt/therapist safety. He does not report any pain. Pt performed bed mobility with SBA, increased time/effort  and cueing for initation. Pt required CGA x2 for STS from EOB and progressed his mobility to 180 feet with Min A x1 via HHA and SBA x1 for safety and chair follow. Attempted to have pt brush his teeth once returned to seated in recliner, but he was not able to process. Pt was left seated in recliner with chair alarm in place and nursing staff aware. He will cont to require skilled acute OT services to maximize his safety and IND to return to PLOF.       If plan is discharge home, recommend the following:  Two people to help with walking and/or transfers;Supervision due to cognitive status;A lot of help with bathing/dressing/bathroom   Equipment Recommendations  Wheelchair (measurements OT)    Recommendations for Other Services      Precautions / Restrictions Precautions Precautions: Fall Recall of Precautions/Restrictions: Impaired Restrictions Weight Bearing Restrictions Per Provider Order: No       Mobility Bed Mobility Overal bed mobility: Needs Assistance Bed Mobility: Supine to Sit     Supine to sit: Supervision, HOB elevated     General bed mobility comments: verb cues for initation, increased time/effort with SBA    Transfers Overall transfer level: Needs assistance Equipment used: 2 person hand held assist Transfers: Sit  to/from Stand Sit to Stand: Contact guard assist, +2 physical assistance           General transfer comment: CGA x2 for STS from EOB at standard height; min A x1 and SBA x1 for mobility 180 feet with close chair follow     Balance Overall balance assessment: Needs assistance Sitting-balance support: Bilateral upper extremity supported, Feet supported Sitting balance-Leahy Scale: Fair Sitting balance - Comments: steady static sitting   Standing balance support: During functional activity, Single extremity supported Standing balance-Leahy Scale: Poor Standing balance comment: min A x1 and SBA x1 with chair follow for safety                           ADL either performed or assessed with clinical judgement   ADL Overall ADL's : Needs assistance/impaired                 Upper Body Dressing : Maximal assistance Upper Body Dressing Details (indicate cue type and reason): to adjust gown seated EOB                        Extremity/Trunk Assessment              Vision       Perception     Praxis     Communication Communication Communication: Impaired Factors Affecting Communication: Difficulty expressing self   Cognition Arousal: Alert Behavior During Therapy: Flat affect  Following commands: Impaired Following commands impaired: Follows one step commands with increased time, Follows one step commands inconsistently      Cueing   Cueing Techniques: Verbal cues, Gestural cues, Tactile cues, Visual cues  Exercises      Shoulder Instructions       General Comments      Pertinent Vitals/ Pain       Pain Assessment Pain Assessment: PAINAD Faces Pain Scale: No hurt Pain Intervention(s): Monitored during session  Home Living                                          Prior Functioning/Environment              Frequency  Min 1X/week        Progress Toward  Goals  OT Goals(current goals can now be found in the care plan section)  Progress towards OT goals: Progressing toward goals  Acute Rehab OT Goals OT Goal Formulation: Patient unable to participate in goal setting Time For Goal Achievement: 03/26/24 Potential to Achieve Goals: Fair  Plan      Co-evaluation    PT/OT/SLP Co-Evaluation/Treatment: Yes Reason for Co-Treatment: For patient/therapist safety;Necessary to address cognition/behavior during functional activity;To address functional/ADL transfers PT goals addressed during session: Mobility/safety with mobility;Balance OT goals addressed during session: ADL's and self-care      AM-PAC OT "6 Clicks" Daily Activity     Outcome Measure   Help from another person eating meals?: A Little Help from another person taking care of personal grooming?: A Lot Help from another person toileting, which includes using toliet, bedpan, or urinal?: A Lot Help from another person bathing (including washing, rinsing, drying)?: A Lot Help from another person to put on and taking off regular upper body clothing?: A Lot Help from another person to put on and taking off regular lower body clothing?: A Lot 6 Click Score: 13    End of Session Equipment Utilized During Treatment: Gait belt  OT Visit Diagnosis: Other abnormalities of gait and mobility (R26.89);Cognitive communication deficit (R41.841);Other symptoms and signs involving cognitive function   Activity Tolerance Patient tolerated treatment well   Patient Left with call bell/phone within reach;Other (comment);in chair;with chair alarm set   Nurse Communication Mobility status        Time: [phone] OT Time Calculation (min): 23 min  Charges: OT General Charges $OT Visit: 1 Visit OT Treatments $Therapeutic Activity: 8-22 mins  Kataleya Zaugg, OTR/L  03/05/24, 12:27 PM   Elah Avellino E Lania Zawistowski 03/05/2024, 12:24 PM

## 2024-03-05 NOTE — Plan of Care (Signed)
°  Problem: Education: Goal: Knowledge of General Education information will improve Description: Including pain rating scale, medication(s)/side effects and non-pharmacologic comfort measures Outcome: Progressing   Problem: Clinical Measurements: Goal: Ability to maintain clinical measurements within normal limits will improve Outcome: Progressing Goal: Will remain free from infection Outcome: Progressing Goal: Cardiovascular complication will be avoided Outcome: Progressing   Problem: Activity: Goal: Risk for activity intolerance will decrease Outcome: Progressing   Problem: Nutrition: Goal: Adequate nutrition will be maintained Outcome: Progressing

## 2024-03-05 NOTE — TOC Progression Note (Addendum)
 Transition of Care [facility]) - Progression Note    Patient Details  Name: Seth Thompson MRN: [id number] Date of Birth: 1946/04/13  Transition of Care [facility]) CM/SW Contact  Odilia Bennett, LCSW Phone Number: 03/05/2024, 12:49 PM  Clinical Narrative:   [facility] admissions coordinator said DSS is working on their paperwork and she is waiting on the attorney.  4:30 pm: Per SNF admissions coordinator, attorney will not be back until Monday.  Expected Discharge Plan: Skilled Nursing Facility Barriers to Discharge: Continued Medical Work up  Expected Discharge Plan and Services       Living arrangements for the past 2 months: Assisted Living Facility                                       Social Determinants of Health (SDOH) Interventions SDOH Screenings   Food Insecurity: No Food Insecurity (02/11/2024)  Housing: Low Risk  (02/11/2024)  Transportation Needs: No Transportation Needs (02/11/2024)  Utilities: Not At Risk (02/11/2024)  Social Connections: Socially Isolated (02/11/2024)  Tobacco Use: Low Risk  (02/12/2024)    Readmission Risk Interventions     No data to display

## 2024-03-06 DIAGNOSIS — G309 Alzheimer's disease, unspecified: Secondary | ICD-10-CM | POA: Diagnosis not present

## 2024-03-06 DIAGNOSIS — F0280 Dementia in other diseases classified elsewhere without behavioral disturbance: Secondary | ICD-10-CM | POA: Diagnosis not present

## 2024-03-06 DIAGNOSIS — R001 Bradycardia, unspecified: Secondary | ICD-10-CM | POA: Diagnosis not present

## 2024-03-06 DIAGNOSIS — K4030 Unilateral inguinal hernia, with obstruction, without gangrene, not specified as recurrent: Secondary | ICD-10-CM | POA: Diagnosis not present

## 2024-03-06 NOTE — Progress Notes (Signed)
  Progress Note   Patient: Seth Thompson:[id number] DOB: Oct 07, 1946 DOA: 02/11/2024     24 DOS: the patient was seen and examined on 03/06/2024   Brief hospital course: TULLY MCINTURFF is a 78 y.o. male with medical history significant of essential hypertension, dementia, frequent PVCs, hyperlipidemia who was brought in from a facility after the patient lost consciousness while sitting on the shower chair at the facility.  He is admitted for further management and evaluation of altered mental status    Principal Problem:   Bradycardia with less than 30 beats per minute Active Problems:   Incarcerated right inguinal hernia   Syncope   Alzheimer's disease (HCC)   Assessment and Plan: Acute metabolic encephalopathy  Severe dementia with behavioral disturbances CT scan of the brain unremarkable. Patient still confused likely progressive dementia in the setting of pneumonia Normal TSH, B12 as well as folic acid levels. Patient is treated with Seroquel  every evening, also added melatonin. Patient has some sundowning at the evening, started Haldol  as needed.   Community-acquired pneumonia Chest x-ray showing findings of infiltrate --received Augmentin  for total 5 days.  Syncope in the setting of bradycardia-improved Upon arrival to the emergency room patient was found to be bradycardic in the 50s and had a drop of his pulse into the 20s. Echo showed EF 60 to 65% with normal diastolic parameters. Telemetry unremarkable. Avoid AV nodal blocking agents. Cardiologist advised no intervention of permanent pacemaker at this time.   Hypokalemia - resolved with replacement   Incarcerated right sided inguinal hernia No surgical intervention recommended by surgery team. Continue as needed pain medication   Essential hypertension, --cont amlodipine  and losartan    Hyperlipidemia -does not appear to be on statin        Subjective:  Patient has no complaint.  Physical  Exam: Vitals:   03/05/24 1453 03/05/24 2016 03/06/24 0325 03/06/24 0739  BP: (!) 114/44 (!) 148/94 (!) 147/106 (!) 160/87  Pulse: (!) 45 65 (!) 46 (!) 49  Resp: 16 18 18 16   Temp: 97.7 F (36.5 C) 98 F (36.7 C) 97.6 F (36.4 C) 98.2 F (36.8 C)  TempSrc: Oral Oral Oral Oral  SpO2: 100% (!) 75% 100% 92%  Weight:      Height:       General exam: Appears calm and comfortable  Respiratory system: Clear to auscultation. Respiratory effort normal. Cardiovascular system: S1 & S2 heard, RRR. No JVD, murmurs, rubs, gallops or clicks. No pedal edema. Gastrointestinal system: Abdomen is nondistended, soft and nontender. No organomegaly or masses felt. Normal bowel sounds heard. Central nervous system: Alert and oriented x1. No focal neurological deficits. Extremities: Symmetric 5 x 5 power. Skin: No rashes, lesions or ulcers Psychiatry: Flat affect.   Data Reviewed:  There are no new results to review at this time.  Family Communication: None  Disposition: Status is: Inpatient Remains inpatient appropriate because: Unsafe discharge, pending placement.     Time spent: 25 minutes  Author: Donaciano Frizzle, MD 03/06/2024 12:20 PM  For on call review [email or web address].

## 2024-03-06 NOTE — Plan of Care (Signed)
   Problem: Health Behavior/Discharge Planning: Goal: Ability to manage health-related needs will improve Outcome: Progressing   Problem: Clinical Measurements: Goal: Ability to maintain clinical measurements within normal limits will improve Outcome: Progressing

## 2024-03-07 DIAGNOSIS — K4030 Unilateral inguinal hernia, with obstruction, without gangrene, not specified as recurrent: Secondary | ICD-10-CM | POA: Diagnosis not present

## 2024-03-07 DIAGNOSIS — F0280 Dementia in other diseases classified elsewhere without behavioral disturbance: Secondary | ICD-10-CM | POA: Diagnosis not present

## 2024-03-07 DIAGNOSIS — G309 Alzheimer's disease, unspecified: Secondary | ICD-10-CM | POA: Diagnosis not present

## 2024-03-07 DIAGNOSIS — R001 Bradycardia, unspecified: Secondary | ICD-10-CM | POA: Diagnosis not present

## 2024-03-07 NOTE — Progress Notes (Signed)
  Progress Note   Patient: Seth Thompson BJY:[id number] DOB: Jan 16, 1946 DOA: 02/11/2024     25 DOS: the patient was seen and examined on 03/07/2024   Brief hospital course: Seth Thompson is a 78 y.o. male with medical history significant of essential hypertension, dementia, frequent PVCs, hyperlipidemia who was brought in from a facility after the patient lost consciousness while sitting on the shower chair at the facility.  He is admitted for further management and evaluation of altered mental status    Principal Problem:   Bradycardia with less than 30 beats per minute Active Problems:   Incarcerated right inguinal hernia   Syncope   Alzheimer's disease (HCC)   Assessment and Plan:  Acute metabolic encephalopathy  Severe dementia with behavioral disturbances CT scan of the brain unremarkable. Patient still confused likely progressive dementia in the setting of pneumonia Normal TSH, B12 as well as folic acid levels. Patient is treated with Seroquel  every evening, also added melatonin. Patient has some sundowning at the evening, started Haldol  as needed.   Community-acquired pneumonia Chest x-ray showing findings of infiltrate --received Augmentin  for total 5 days.  Syncope in the setting of bradycardia-improved Upon arrival to the emergency room patient was found to be bradycardic in the 50s and had a drop of his pulse into the 20s. Echo showed EF 60 to 65% with normal diastolic parameters. Telemetry unremarkable. Avoid AV nodal blocking agents. Cardiologist advised no intervention of permanent pacemaker at this time.   Hypokalemia - resolved with replacement   Incarcerated right sided inguinal hernia No surgical intervention recommended by surgery team. Continue as needed pain medication   Essential hypertension, --cont amlodipine  and losartan    Hyperlipidemia -does not appear to be on statin  No change in treatment plan, pending placement.    Subjective:   Patient doing well, no complaint.  Confused.  Physical Exam: Vitals:   03/06/24 1648 03/06/24 2000 03/07/24 0352 03/07/24 0756  BP: 131/66 137/77 125/81 (!) 155/84  Pulse: (!) 51 (!) 55  (!) 55  Resp: 16 16 20 18   Temp: 98 F (36.7 C) 98.2 F (36.8 C) 97.7 F (36.5 C) (!) 93.4 F (34.1 C)  TempSrc:  Oral    SpO2: 91% 93% 100% 99%  Weight:      Height:       General exam: Appears calm and comfortable  Respiratory system: Clear to auscultation. Respiratory effort normal. Cardiovascular system: S1 & S2 heard, RRR. No JVD, murmurs, rubs, gallops or clicks. No pedal edema. Gastrointestinal system: Abdomen is nondistended, soft and nontender. No organomegaly or masses felt. Normal bowel sounds heard. Central nervous system: Alert and oriented x1. No focal neurological deficits. Extremities: Symmetric 5 x 5 power. Skin: No rashes, lesions or ulcers Psychiatry: Flat affect.    Data Reviewed:  There are no new results to review at this time.  Family Communication: None  Disposition: Status is: Inpatient Remains inpatient appropriate because: Unsafe discharge plan.     Time spent: 25 minutes  Author: Donaciano Frizzle, MD 03/07/2024 11:31 AM  For on call review [email or web address].

## 2024-03-07 NOTE — Plan of Care (Signed)
   Problem: Education: Goal: Knowledge of General Education information will improve Description Including pain rating scale, medication(s)/side effects and non-pharmacologic comfort measures Outcome: Progressing   Problem: Health Behavior/Discharge Planning: Goal: Ability to manage health-related needs will improve Outcome: Progressing

## 2024-03-07 NOTE — TOC Progression Note (Signed)
 Transition of Care [facility]) - Progression Note    Patient Details  Name: Seth Thompson MRN: [id number] Date of Birth: 17-May-1946  Transition of Care [facility]) CM/SW Contact  Loman Risk, RN Phone Number: 03/07/2024, 1:39 PM  Clinical Narrative:    Patient still pending placement at [facility] with DSS   Expected Discharge Plan: Skilled Nursing Facility Barriers to Discharge: Continued Medical Work up  Expected Discharge Plan and Services       Living arrangements for the past 2 months: Assisted Living Facility                                       Social Determinants of Health (SDOH) Interventions SDOH Screenings   Food Insecurity: No Food Insecurity (02/11/2024)  Housing: Low Risk  (02/11/2024)  Transportation Needs: No Transportation Needs (02/11/2024)  Utilities: Not At Risk (02/11/2024)  Social Connections: Socially Isolated (02/11/2024)  Tobacco Use: Low Risk  (02/12/2024)    Readmission Risk Interventions     No data to display

## 2024-03-07 NOTE — Progress Notes (Signed)
   03/07/24 1100  Assess: MEWS Score  Temp 97.9 F (36.6 C)  Assess: MEWS Score  MEWS Temp 0  MEWS Systolic 0  MEWS Pulse 0  MEWS RR 0  MEWS LOC 0  MEWS Score 0  MEWS Score Color Green  Assess: if the MEWS score is Yellow or Red  Were vital signs accurate and taken at a resting state? No, vital signs rechecked  Assess: SIRS CRITERIA  SIRS Temperature  0  SIRS Respirations  0  SIRS Pulse 0  SIRS WBC 0  SIRS Score Sum  0

## 2024-03-08 DIAGNOSIS — G309 Alzheimer's disease, unspecified: Secondary | ICD-10-CM | POA: Diagnosis not present

## 2024-03-08 DIAGNOSIS — R001 Bradycardia, unspecified: Secondary | ICD-10-CM | POA: Diagnosis not present

## 2024-03-08 DIAGNOSIS — F0280 Dementia in other diseases classified elsewhere without behavioral disturbance: Secondary | ICD-10-CM | POA: Diagnosis not present

## 2024-03-08 DIAGNOSIS — K4030 Unilateral inguinal hernia, with obstruction, without gangrene, not specified as recurrent: Secondary | ICD-10-CM | POA: Diagnosis not present

## 2024-03-08 NOTE — Plan of Care (Signed)

## 2024-03-08 NOTE — Progress Notes (Addendum)
 Mobility Specialist - Progress Note  During mobility:  SpO2 100%   03/08/24 1407  Mobility  Activity Ambulated with assistance in hallway;Transferred from bed to chair  Level of Assistance Contact guard assist, steadying assist  Assistive Device  (HHA)  Distance Ambulated (ft) 60 ft  Activity Response Tolerated well  Mobility visit 1 Mobility  Mobility Specialist Start Time (ACUTE ONLY) 1348  Mobility Specialist Stop Time (ACUTE ONLY) 1404  Mobility Specialist Time Calculation (min) (ACUTE ONLY) 16 min   Pt sitting in the recliner upon entry, utilizing RA. Pt STS from recliner Mod-MinA +2, amb ~40 ft in the hallway with HHA +2, transiting to HHA of the LUE with chair follow--- cuing for redirection to task during session. Pt endorsed slight SOB and fatigue, prompting a return to the recliner. Pt returned to the room, transferred to bed via SPT. Pt left supine with alarm set and needs within reach.  Versa Gore Mobility Specialist 03/08/24 2:27 PM

## 2024-03-08 NOTE — TOC Progression Note (Signed)
 Transition of Care [facility]) - Progression Note    Patient Details  Name: Seth Thompson MRN: [id number] Date of Birth: October 08, 1945  Transition of Care [facility]) CM/SW Contact  Loman Risk, RN Phone Number: 03/08/2024, 3:05 PM  Clinical Narrative:     Message sent to DSS and Abran Abrahams at [facility] to get update on status of paperwork from attorney   Expected Discharge Plan: Skilled Nursing Facility Barriers to Discharge: Continued Medical Work up  Expected Discharge Plan and Services       Living arrangements for the past 2 months: Assisted Living Facility                                       Social Determinants of Health (SDOH) Interventions SDOH Screenings   Food Insecurity: No Food Insecurity (02/11/2024)  Housing: Low Risk  (02/11/2024)  Transportation Needs: No Transportation Needs (02/11/2024)  Utilities: Not At Risk (02/11/2024)  Social Connections: Socially Isolated (02/11/2024)  Tobacco Use: Low Risk  (02/12/2024)    Readmission Risk Interventions     No data to display

## 2024-03-08 NOTE — Progress Notes (Signed)
 Occupational Therapy Treatment Patient Details Name: Seth Thompson MRN: [id number] DOB: Jul 29, 1946 Today's Date: 03/08/2024   History of present illness Pt is a 78 year old male admitted after LOC in shower, admitted with acute metabolic encephalopathy, syncope in the setting of bradycardia, hypokalemia, incarcerated right sided inguinal hernia. PMH significant for essential hypertension, dementia, frequent PVCs, and hyperlipidemia.   OT comments  Pt seen for OT tx overlapping with PT session for safety with ADL mobility. Pt required +2 handheld assist for mobility with chair follow. Once returned to the recliner, pt required initial hand over hand to initiate brushing teeth paired with simple multimodal cues, pt required additional assist/cues to continue to brush for thoroughness. Pt required slightly less support to initiate washing his face however still required MOD A overall to complete for thoroughness. Pt continues to benefit from skilled OT services to maximize safety/indep and minimize functional decline and caregiver burden.        If plan is discharge home, recommend the following:  Two people to help with walking and/or transfers;Supervision due to cognitive status;A lot of help with bathing/dressing/bathroom   Equipment Recommendations  Wheelchair (measurements OT);Wheelchair cushion (measurements OT)    Recommendations for Other Services      Precautions / Restrictions Precautions Precautions: Fall Recall of Precautions/Restrictions: Impaired Restrictions Weight Bearing Restrictions Per Provider Order: No       Mobility Bed Mobility               General bed mobility comments: NT, in recliner at end of session    Transfers Overall transfer level: Needs assistance Equipment used: 2 person hand held assist Transfers: Sit to/from Stand Sit to Stand: Min assist, +2 physical assistance           General transfer comment: MIN A +2 from PT for transfers  while OT provided chair follow assist     Balance Overall balance assessment: Needs assistance Sitting-balance support: Feet supported, No upper extremity supported Sitting balance-Leahy Scale: Fair Sitting balance - Comments: steady static sitting                                   ADL either performed or assessed with clinical judgement   ADL Overall ADL's : Needs assistance/impaired     Grooming: Sitting;Wash/dry face;Oral care;Moderate assistance Grooming Details (indicate cue type and reason): Initial hand over hand to initiate brushing teeth paired with simple multimodal cues, pt required additional assist/cues to continue to brush for thoroughness. Pt required slightly less support to initiate washing his face however still required MOD A overall to complete for thoroughness                                    Extremity/Trunk Assessment              Vision       Perception     Praxis     Communication Communication Communication: Impaired Factors Affecting Communication: Difficulty expressing self   Cognition Arousal: Alert Behavior During Therapy: Flat affect Cognition: History of cognitive impairments, Cognition impaired                               Following commands: Impaired Following commands impaired: Follows one step commands inconsistently, Follows one step commands with increased  time      Cueing   Cueing Techniques: Verbal cues, Gestural cues, Tactile cues, Visual cues  Exercises      Shoulder Instructions       General Comments Pt's HR 88 bpm and SpO2 sats 98% on room air post ambulation.    Pertinent Vitals/ Pain       Pain Assessment Pain Assessment: Faces Faces Pain Scale: No hurt  Home Living                                          Prior Functioning/Environment              Frequency  Min 1X/week        Progress Toward Goals  OT Goals(current goals can  now be found in the care plan section)  Progress towards OT goals: Progressing toward goals  Acute Rehab OT Goals OT Goal Formulation: Patient unable to participate in goal setting Time For Goal Achievement: 03/26/24 Potential to Achieve Goals: Fair  Plan      Co-evaluation    PT/OT/SLP Co-Evaluation/Treatment: Yes Reason for Co-Treatment: For patient/therapist safety;Necessary to address cognition/behavior during functional activity;To address functional/ADL transfers PT goals addressed during session: Mobility/safety with mobility;Balance OT goals addressed during session: ADL's and self-care      AM-PAC OT "6 Clicks" Daily Activity     Outcome Measure   Help from another person eating meals?: A Little Help from another person taking care of personal grooming?: A Lot Help from another person toileting, which includes using toliet, bedpan, or urinal?: A Lot Help from another person bathing (including washing, rinsing, drying)?: A Lot Help from another person to put on and taking off regular upper body clothing?: A Lot Help from another person to put on and taking off regular lower body clothing?: A Lot 6 Click Score: 13    End of Session    OT Visit Diagnosis: Other abnormalities of gait and mobility (R26.89);Cognitive communication deficit (R41.841);Other symptoms and signs involving cognitive function Symptoms and signs involving cognitive functions: Other cerebrovascular disease   Activity Tolerance Patient tolerated treatment well   Patient Left in chair;with call bell/phone within reach;with chair alarm set;Other (comment) (floor mat in place, telesitter present)   Nurse Communication          Time: 1610-9604 OT Time Calculation (min): 21 min  Charges: OT General Charges $OT Visit: 1 Visit OT Treatments $Self Care/Home Management : 8-22 mins  Berenda Breaker., MPH, MS, OTR/L ascom [phone] 03/08/24, 2:03 PM

## 2024-03-08 NOTE — Progress Notes (Signed)
 Physical Therapy Treatment Patient Details Name: Seth Thompson MRN: [id number] DOB: 12-Sep-1946 Today's Date: 03/08/2024   History of Present Illness Pt is a 78 year old male admitted after LOC in shower, admitted with acute metabolic encephalopathy, syncope in the setting of bradycardia, hypokalemia, incarcerated right sided inguinal hernia. PMH significant for essential hypertension, dementia, frequent PVCs, and hyperlipidemia.    PT Comments  Pt resting in bed upon PT arrival; partial PT/OT co-treat performed.  During session pt was min to mod assist x2 for bed mobility (vc's and assist for initiation); min assist x2 to stand up from bed with B hand hold assist; and min assist x2 (B hand hold assist) to ambulate around nursing loop.  No pain-like behavior noted.  Pt requiring cueing to take longer/bigger steps during ambulation.  Improved ambulation noted during session.  Will continue to focus on balance and progressive functional mobility during hospitalization.    If plan is discharge home, recommend the following: Two people to help with walking and/or transfers;A lot of help with bathing/dressing/bathroom;Direct supervision/assist for medications management;Supervision due to cognitive status;Assist for transportation   Can travel by private vehicle     No  Equipment Recommendations  Other (comment) (TBD at next venue of care)    Recommendations for Other Services       Precautions / Restrictions Precautions Precautions: Fall Recall of Precautions/Restrictions: Impaired Restrictions Weight Bearing Restrictions Per Provider Order: No     Mobility  Bed Mobility Overal bed mobility: Needs Assistance Bed Mobility: Supine to Sit     Supine to sit: Min assist, Mod assist, +2 for physical assistance     General bed mobility comments: cues and extra time for initiation; assist for B LE's and trunk    Transfers Overall transfer level: Needs assistance Equipment used: 2  person hand held assist Transfers: Sit to/from Stand Sit to Stand: Min assist, +2 physical assistance           General transfer comment: B hand hold assist; vc's for standing and sitting    Ambulation/Gait Ambulation/Gait assistance: Min assist, +2 physical assistance Gait Distance (Feet): 190 Feet Assistive device: 2 person hand held assist Gait Pattern/deviations: Step-through pattern, Shuffle, Decreased step length - right, Decreased step length - left Gait velocity: decreased     General Gait Details: vc's for longer/bigger steps; different verbal cueing attempted to improve length of steps (inconsistent with improvement); decreased trunk support with increased distance ambulating (pt initially with posterior lean); chair follow   Stairs             Wheelchair Mobility     Tilt Bed    Modified Rankin (Stroke Patients Only)       Balance Overall balance assessment: Needs assistance Sitting-balance support: Bilateral upper extremity supported, Feet supported Sitting balance-Leahy Scale: Fair Sitting balance - Comments: steady static sitting   Standing balance support: Bilateral upper extremity supported Standing balance-Leahy Scale: Poor Standing balance comment: initial posterior lean in standing; improved during session                            Communication Communication Communication: Impaired Factors Affecting Communication: Difficulty expressing self  Cognition Arousal: Alert Behavior During Therapy: Flat affect   PT - Cognitive impairments: No family/caregiver present to determine baseline                       PT - Cognition Comments: Pt did not  state name or DOB when asked Following commands: Impaired Following commands impaired: Follows one step commands with increased time, Follows one step commands inconsistently    Cueing Cueing Techniques: Verbal cues, Gestural cues, Tactile cues, Visual cues  Exercises       General Comments General comments (skin integrity, edema, etc.): Pt's HR 88 bpm and SpO2 sats 98% on room air post ambulation.  Nursing cleared pt for participation in physical therapy.  Pt appeared agreeable to PT session.      Pertinent Vitals/Pain Pain Assessment Pain Assessment: Faces Faces Pain Scale: No hurt Pain Intervention(s): Limited activity within patient's tolerance, Monitored during session, Repositioned    Home Living                          Prior Function            PT Goals (current goals can now be found in the care plan section) Acute Rehab PT Goals PT Goal Formulation: Patient unable to participate in goal setting Time For Goal Achievement: 03/12/24 Potential to Achieve Goals: Fair Progress towards PT goals: Progressing toward goals    Frequency    Min 1X/week      PT Plan      Co-evaluation PT/OT/SLP Co-Evaluation/Treatment: Yes Reason for Co-Treatment: For patient/therapist safety;Necessary to address cognition/behavior during functional activity;To address functional/ADL transfers PT goals addressed during session: Mobility/safety with mobility;Balance        AM-PAC PT "6 Clicks" Mobility   Outcome Measure  Help needed turning from your back to your side while in a flat bed without using bedrails?: A Little Help needed moving from lying on your back to sitting on the side of a flat bed without using bedrails?: Total Help needed moving to and from a bed to a chair (including a wheelchair)?: A Lot Help needed standing up from a chair using your arms (e.g., wheelchair or bedside chair)?: A Lot Help needed to walk in hospital room?: Total Help needed climbing 3-5 steps with a railing? : Total 6 Click Score: 10    End of Session Equipment Utilized During Treatment: Gait belt Activity Tolerance: Patient tolerated treatment well Patient left: in chair;Other (comment) (OT present working with pt (OT to set pt up when finished)) Nurse  Communication: Mobility status;Precautions PT Visit Diagnosis: Unsteadiness on feet (R26.81);Difficulty in walking, not elsewhere classified (R26.2);Muscle weakness (generalized) (M62.81)     Time: 1478-2956 PT Time Calculation (min) (ACUTE ONLY): 23 min  Charges:    $Gait Training: 8-22 mins PT General Charges $$ ACUTE PT VISIT: 1 Visit                     Amador Junes, PT 03/08/24, 12:32 PM

## 2024-03-08 NOTE — Progress Notes (Signed)
  Progress Note   Patient: Seth Thompson [id number] DOB: 1946/05/02 DOA: 02/11/2024     26 DOS: the patient was seen and examined on 03/08/2024   Brief hospital course: Seth Thompson is a 78 y.o. male with medical history significant of essential hypertension, dementia, frequent PVCs, hyperlipidemia who was brought in from a facility after the patient lost consciousness while sitting on the shower chair at the facility.  He is admitted for further management and evaluation of altered mental status    Principal Problem:   Bradycardia with less than 30 beats per minute Active Problems:   Incarcerated right inguinal hernia   Syncope   Alzheimer's disease (HCC)   Assessment and Plan: Acute metabolic encephalopathy  Severe dementia with behavioral disturbances CT scan of the brain unremarkable. Patient still confused likely progressive dementia in the setting of pneumonia Normal TSH, B12 as well as folic acid levels. Patient is treated with Seroquel  every evening, also added melatonin. Patient has some sundowning at the evening, started Haldol  as needed.   Community-acquired pneumonia Chest x-ray showing findings of infiltrate --received Augmentin  for total 5 days.  Syncope in the setting of bradycardia-improved Upon arrival to the emergency room patient was found to be bradycardic in the 50s and had a drop of his pulse into the 20s. Echo showed EF 60 to 65% with normal diastolic parameters. Telemetry unremarkable. Avoid AV nodal blocking agents. Cardiologist advised no intervention of permanent pacemaker at this time.   Hypokalemia - resolved with replacement   Incarcerated right sided inguinal hernia No surgical intervention recommended by surgery team. Continue as needed pain medication   Essential hypertension, --cont amlodipine  and losartan    Hyperlipidemia -does not appear to be on statin  Condition stable, pending nursing home placement.   Subjective:  Patient has no complaint.  Physical Exam: Vitals:   03/07/24 1532 03/07/24 2009 03/08/24 0401 03/08/24 0754  BP: 110/61 (!) 146/74 120/77 103/79  Pulse: (!) 50 (!) 53 72 76  Resp: 18 18 18 16   Temp: 97.8 F (36.6 C)  97.8 F (36.6 C) (!) 97.3 F (36.3 C)  TempSrc: Oral Oral Oral Oral  SpO2: 98% 98% 100% 99%  Weight:      Height:       General exam: Appears calm and comfortable  Respiratory system: Clear to auscultation. Respiratory effort normal. Cardiovascular system: S1 & S2 heard, RRR. No JVD, murmurs, rubs, gallops or clicks. No pedal edema. Gastrointestinal system: Abdomen is nondistended, soft and nontender. No organomegaly or masses felt. Normal bowel sounds heard. Central nervous system: Alert and oriented x1. No focal neurological deficits. Extremities: Symmetric 5 x 5 power. Skin: No rashes, lesions or ulcers Psychiatry: Flat affect.   Data Reviewed:  There are no new results to review at this time.  Family Communication: None  Disposition: Status is: Inpatient Remains inpatient appropriate because: Unsafe discharge option.     Time spent: 25 minutes  Author: Donaciano Frizzle, MD 03/08/2024 12:10 PM  For on call review [email or web address].

## 2024-03-09 DIAGNOSIS — R001 Bradycardia, unspecified: Secondary | ICD-10-CM | POA: Diagnosis not present

## 2024-03-09 DIAGNOSIS — K4030 Unilateral inguinal hernia, with obstruction, without gangrene, not specified as recurrent: Secondary | ICD-10-CM | POA: Diagnosis not present

## 2024-03-09 DIAGNOSIS — F0280 Dementia in other diseases classified elsewhere without behavioral disturbance: Secondary | ICD-10-CM | POA: Diagnosis not present

## 2024-03-09 DIAGNOSIS — G309 Alzheimer's disease, unspecified: Secondary | ICD-10-CM | POA: Diagnosis not present

## 2024-03-09 LAB — CBC
HCT: 45.1 % (ref 39.0–52.0)
Hemoglobin: 15.7 g/dL (ref 13.0–17.0)
MCH: 30.7 pg (ref 26.0–34.0)
MCHC: 34.8 g/dL (ref 30.0–36.0)
MCV: 88.3 fL (ref 80.0–100.0)
Platelets: 160 10*3/uL (ref 150–400)
RBC: 5.11 MIL/uL (ref 4.22–5.81)
RDW: 12.2 % (ref 11.5–15.5)
WBC: 5.3 10*3/uL (ref 4.0–10.5)
nRBC: 0 % (ref 0.0–0.2)

## 2024-03-09 LAB — CREATININE, SERUM
Creatinine, Ser: 0.93 mg/dL (ref 0.61–1.24)
GFR, Estimated: >60 mL/min (ref >60)

## 2024-03-09 NOTE — Progress Notes (Signed)
  Progress Note   Patient: Seth Thompson UJW:[id number] DOB: 1946/06/16 DOA: 02/11/2024     27 DOS: the patient was seen and examined on 03/09/2024   Brief hospital course: Seth Thompson is a 78 y.o. male with medical history significant of essential hypertension, dementia, frequent PVCs, hyperlipidemia who was brought in from a facility after the patient lost consciousness while sitting on the shower chair at the facility.  He is admitted for further management and evaluation of altered mental status    Principal Problem:   Bradycardia with less than 30 beats per minute Active Problems:   Incarcerated right inguinal hernia   Syncope   Alzheimer's disease (HCC)   Assessment and Plan: Acute metabolic encephalopathy  Severe dementia with behavioral disturbances CT scan of the brain unremarkable. Patient still confused likely progressive dementia in the setting of pneumonia Normal TSH, B12 as well as folic acid levels. Patient is treated with Seroquel  every evening, also added melatonin. Patient has some sundowning at the evening, started Haldol  as needed.   Community-acquired pneumonia Chest x-ray showing findings of infiltrate --received Augmentin  for total 5 days.  Syncope in the setting of bradycardia-improved Upon arrival to the emergency room patient was found to be bradycardic in the 50s and had a drop of his pulse into the 20s. Echo showed EF 60 to 65% with normal diastolic parameters. Telemetry unremarkable. Avoid AV nodal blocking agents. Cardiologist advised no intervention of permanent pacemaker at this time.   Hypokalemia - resolved with replacement   Incarcerated right sided inguinal hernia No surgical intervention recommended by surgery team. Continue as needed pain medication   Essential hypertension, --cont amlodipine  and losartan    Hyperlipidemia -does not appear to be on statin   No change in treatment plan in the past week.  Pending  placement.    Subjective: Patient has no complaint.  Physical Exam: Vitals:   03/08/24 2052 03/09/24 0423 03/09/24 0756 03/09/24 1042  BP: 121/60 135/75 (!) 160/63 (!) 131/94  Pulse: (!) 50 (!) 43 (!) 48 (!) 51  Resp: 18 18 17    Temp: 98 F (36.7 C) (!) 97.5 F (36.4 C) 97.8 F (36.6 C)   TempSrc: Oral Oral Oral   SpO2: 99% 100% 95%   Weight:      Height:       General exam: Appears calm and comfortable  Respiratory system: Clear to auscultation. Respiratory effort normal. Cardiovascular system: S1 & S2 heard, RRR. No JVD, murmurs, rubs, gallops or clicks. No pedal edema. Gastrointestinal system: Abdomen is nondistended, soft and nontender. No organomegaly or masses felt. Normal bowel sounds heard. Central nervous system: Alert and oriented x1. No focal neurological deficits. Extremities: Symmetric 5 x 5 power. Skin: No rashes, lesions or ulcers Psychiatry: Flat affect.   Data Reviewed:  There are no new results to review at this time.  Family Communication: None  Disposition: Status is: Inpatient Unsafe discharge.     Time spent: 25 minutes  Author: Donaciano Frizzle, MD 03/09/2024 1:40 PM  For on call review [email or web address].

## 2024-03-09 NOTE — Progress Notes (Signed)
 Physical Therapy Treatment Patient Details Name: Seth Thompson MRN: [id number] DOB: 1946-03-17 Today's Date: 03/09/2024   History of Present Illness Pt is a 78 year old male admitted after LOC in shower, admitted with acute metabolic encephalopathy, syncope in the setting of bradycardia, hypokalemia, incarcerated right sided inguinal hernia. PMH significant for essential hypertension, dementia, frequent PVCs, and hyperlipidemia.    PT Comments  Pt resting in recliner upon PT arrival; pt appearing agreeable to therapy.  During session pt was min to mod assist x1 plus CGA of 2nd for transfers (x5 trials) and min assist x2 to ambulate in hallway with B hand hold assist.  Posterior lean noted in standing requiring assist for balance.  Will continue to focus on strengthening, balance, and progressive functional mobility during hospitalization.   If plan is discharge home, recommend the following: Two people to help with walking and/or transfers;A lot of help with bathing/dressing/bathroom;Direct supervision/assist for medications management;Supervision due to cognitive status;Assist for transportation   Can travel by private vehicle     No  Equipment Recommendations  Other (comment) (TBD at next venue of care)    Recommendations for Other Services       Precautions / Restrictions Precautions Precautions: Fall Recall of Precautions/Restrictions: Impaired Restrictions Weight Bearing Restrictions Per Provider Order: No     Mobility  Bed Mobility Overal bed mobility: Needs Assistance Bed Mobility: Sit to Supine       Sit to supine: Min assist   General bed mobility comments: assist for trunk; vc's for laying down    Transfers Overall transfer level: Needs assistance Equipment used: None, 1 person hand held assist Transfers: Sit to/from Stand, Bed to chair/wheelchair/BSC Sit to Stand: Min assist, Mod assist, +2 physical assistance, Contact guard assist           General  transfer comment: x2 trials from recliner (min assist x1 plus CGA of 2nd) and x3 trials from bed (min assist x1 1st 2nd trials with CGA of 2nd; mod assist x1 3rd trial with CGA of 2nd); pt pushing hands off arms rests to stand from recliner; R hand hold assist and CGA for cueing to push off bed with L UE standing from bed; posterior lean noted in standing requiring assist to correct    Ambulation/Gait Ambulation/Gait assistance: Min assist, +2 physical assistance (chair follow) Gait Distance (Feet): 70 Feet Assistive device: 2 person hand held assist   Gait velocity: decreased     General Gait Details: vc's for bigger steps and visual demonstrating for improving steps; decreased trunk support with increased distance ambulating (pt initially with posterior lean); chair follow; decreased step length compared to last PT session   Stairs             Wheelchair Mobility     Tilt Bed    Modified Rankin (Stroke Patients Only)       Balance Overall balance assessment: Needs assistance Sitting-balance support: Feet supported, No upper extremity supported Sitting balance-Leahy Scale: Fair Sitting balance - Comments: steady static sitting   Standing balance support: Bilateral upper extremity supported Standing balance-Leahy Scale: Poor Standing balance comment: initial posterior lean in standing                            Communication Communication Communication: Impaired Factors Affecting Communication: Difficulty expressing self  Cognition Arousal: Alert Behavior During Therapy: Flat affect   PT - Cognitive impairments: No family/caregiver present to determine baseline  PT - Cognition Comments: Pt stated he did not know his name or DOB Following commands: Impaired Following commands impaired: Follows one step commands inconsistently, Follows one step commands with increased time    Cueing Cueing Techniques: Verbal cues,  Gestural cues, Tactile cues, Visual cues  Exercises      General Comments General comments (skin integrity, edema, etc.): HR 66 bpm at rest beginning of session.  Nursing cleared pt for participation in physical therapy.      Pertinent Vitals/Pain Pain Assessment Pain Assessment: Faces Faces Pain Scale: No hurt Pain Intervention(s): Limited activity within patient's tolerance, Monitored during session, Repositioned    Home Living                          Prior Function            PT Goals (current goals can now be found in the care plan section) Acute Rehab PT Goals PT Goal Formulation: Patient unable to participate in goal setting Time For Goal Achievement: 03/12/24 Potential to Achieve Goals: Fair Progress towards PT goals: Progressing toward goals    Frequency    Min 1X/week      PT Plan      Co-evaluation              AM-PAC PT "6 Clicks" Mobility   Outcome Measure  Help needed turning from your back to your side while in a flat bed without using bedrails?: A Little Help needed moving from lying on your back to sitting on the side of a flat bed without using bedrails?: A Lot Help needed moving to and from a bed to a chair (including a wheelchair)?: A Lot Help needed standing up from a chair using your arms (e.g., wheelchair or bedside chair)?: A Lot Help needed to walk in hospital room?: Total Help needed climbing 3-5 steps with a railing? : Total 6 Click Score: 11    End of Session Equipment Utilized During Treatment: Gait belt Activity Tolerance: Patient tolerated treatment well Patient left: in bed;with call bell/phone within reach;with bed alarm set;Other (comment) (fall mat in place; tele-sitter in place) Nurse Communication: Mobility status;Precautions PT Visit Diagnosis: Unsteadiness on feet (R26.81);Difficulty in walking, not elsewhere classified (R26.2);Muscle weakness (generalized) (M62.81)     Time: 1610-9604 PT Time  Calculation (min) (ACUTE ONLY): 29 min  Charges:    $Gait Training: 8-22 mins $Therapeutic Exercise: 8-22 mins PT General Charges $$ ACUTE PT VISIT: 1 Visit                     Amador Junes, PT 03/09/24, 3:29 PM

## 2024-03-09 NOTE — TOC Progression Note (Signed)
 Transition of Care [facility]) - Progression Note    Patient Details  Name: Seth Thompson MRN: [id number] Date of Birth: 06-May-1946  [facility] of [facility] CM/SW Contact  Loman Risk, RN Phone Number: 03/09/2024, 2:10 PM  Clinical Narrative:    Per Fredrik Jensen with DSS admission paperwork has been completed for [facility].    Per Abran Abrahams at [facility] they are still awaiting response from [employer] [place] for payment,  she has reached out to their assistant today  Per MD plan is for patient to be followed by palliative at [facility], not hospice.  Marylou Sobers with [facility] aware    Expected Discharge Plan: Skilled Nursing Facility Barriers to Discharge: Continued Medical Work up  Expected Discharge Plan and Services       Living arrangements for the past 2 months: Assisted Living Facility                                       Social Determinants of Health (SDOH) Interventions SDOH Screenings   Food Insecurity: No Food Insecurity (02/11/2024)  Housing: Low Risk  (02/11/2024)  Transportation Needs: No Transportation Needs (02/11/2024)  Utilities: Not At Risk (02/11/2024)  Social Connections: Socially Isolated (02/11/2024)  Tobacco Use: Low Risk  (02/12/2024)    Readmission Risk Interventions     No data to display

## 2024-03-09 NOTE — Progress Notes (Signed)
 Mobility Specialist - Progress Note   03/09/24 1212  Mobility  Activity Ambulated with assistance in room;Transferred from bed to chair  Level of Assistance Minimal assist, patient does 75% or more  Assistive Device  (HHA)  Distance Ambulated (ft) 6 ft  Activity Response Tolerated well  Mobility visit 1 Mobility  Mobility Specialist Start Time (ACUTE ONLY) 1153  Mobility Specialist Stop Time (ACUTE ONLY) 1208  Mobility Specialist Time Calculation (min) (ACUTE ONLY) 15 min   Pt in fowler position upon entry, utilizing RA. Pt completed bed mob MinA +2 to bring trunk from sup to sit and BLE EOB. Pt dangled EOB for ~1 min while completing leg extension-- vocal and tactile cuing required. PT STS from EOB MinA +2, transferred to the recliner MinA-CGA--- requiring MinA +2 to sit as Pt was unable to follow directions. Pt left seated in the recliner with alarm set and needs within reach. RN notified.  Versa Gore Mobility Specialist 03/09/24 12:21 PM

## 2024-03-10 DIAGNOSIS — R001 Bradycardia, unspecified: Secondary | ICD-10-CM | POA: Diagnosis not present

## 2024-03-10 NOTE — Progress Notes (Signed)
 Mobility Specialist - Progress Note   03/10/24 1445  Mobility  Activity Ambulated with assistance in room;Transferred from bed to chair;Transferred from chair to bed;Dangled on edge of bed  Level of Assistance +2 (takes two people)  Assistive Device None  Distance Ambulated (ft) 8 ft  Activity Response Tolerated well  Mobility visit 1 Mobility  Mobility Specialist Start Time (ACUTE ONLY) 1405  Mobility Specialist Stop Time (ACUTE ONLY) 1434  Mobility Specialist Time Calculation (min) (ACUTE ONLY) 29 min   Pt in fowler position upon entry, utilizing RA. Pt seemly agreeable to OOB activity. Pt completed bed mob MaxA +2, Pt noticeable resisting when assisting to bring trunk from sup to sit. Pt STS with ModA +2, BM noted prompting a return seated EOB. Pt STS with ModA +2, MS completed peri care, transferred to the recliner with MinA +2--- active BM upon decent. Pt STS from recliner Mod-MinA +2, transferred to bed and placed on bedpan. Pt became very vocal when placed on bed pan, however Pt quickly removes bed pan. Further amb deferred d/t active BM. Pt left semi fowler with alarm set and needs within reach. RN and NT notified.  Versa Gore Mobility Specialist 03/10/24 2:52 PM

## 2024-03-10 NOTE — Plan of Care (Signed)

## 2024-03-10 NOTE — Progress Notes (Signed)
 Occupational Therapy Treatment Patient Details Name: Seth Thompson MRN: [id number] DOB: [birth year] Today's Date: 03/10/2024   History of present illness Pt is a 78 year old male admitted after LOC in shower, admitted with acute metabolic encephalopathy, syncope in the setting of bradycardia, hypokalemia, incarcerated right sided inguinal hernia. PMH significant for essential hypertension, dementia, frequent PVCs, and hyperlipidemia.   OT comments  Upon entering the room, pt sleeping soundly but awakens to name. Pt with soiled hospital gown and needing max A for bed mobility for supine >sit. Static sitting balance min A progressing to close supervision. Difficult to understand pt and tangential speech during session. Max A to doff soiled gown and don clean one while seated. Hand over hand assistance to wash face with warm cloth. Pt unable to follow 25% of commands even with increased time to process and max multimodal cuing. Pt returning to supine with min A and posey mitt remains donned on R UE. Bed alarm activated and call bell within reach.       If plan is discharge home, recommend the following:  Supervision due to cognitive status;Two people to help with walking and/or transfers;A lot of help with bathing/dressing/bathroom   Equipment Recommendations  Wheelchair (measurements OT);Wheelchair cushion (measurements OT)       Precautions / Restrictions Precautions Precautions: Fall Recall of Precautions/Restrictions: Impaired       Mobility Bed Mobility Overal bed mobility: Needs Assistance Bed Mobility: Sit to Supine     Supine to sit: Max assist Sit to supine: Min assist        Transfers                         Balance Overall balance assessment: Needs assistance Sitting-balance support: Feet supported, No upper extremity supported Sitting balance-Leahy Scale: Fair Sitting balance - Comments: steady static sitting                                    ADL either performed or assessed with clinical judgement   ADL Overall ADL's : Needs assistance/impaired     Grooming: Sitting;Wash/dry face;Oral care;Total assistance Grooming Details (indicate cue type and reason): hand over hand assistance to initiate         Upper Body Dressing : Maximal assistance Upper Body Dressing Details (indicate cue type and reason): to don clean hospital gown on EOB                        Extremity/Trunk Assessment Upper Extremity Assessment Upper Extremity Assessment: Generalized weakness   Lower Extremity Assessment Lower Extremity Assessment: Generalized weakness        Vision Patient Visual Report: No change from baseline           Communication Communication Communication: Impaired Factors Affecting Communication: Difficulty expressing self   Cognition Arousal: Alert Behavior During Therapy: Flat affect Cognition: History of cognitive impairments, Cognition impaired   Orientation impairments: Place, Time, Situation Awareness: Intellectual awareness impaired, Online awareness impaired Memory impairment (select all impairments): Short-term memory, Working [employer] fast streamer, Non-declarative long-term memory, Geneticist, molecular long-term memory Attention impairment (select first level of impairment): Focused attention Executive functioning impairment (select all impairments): Initiation, Organization, Sequencing, Reasoning, Problem solving OT - Cognition Comments: followed one step directions with increased time and more accuracy this date  Following commands: Impaired Following commands impaired: Follows one step commands inconsistently, Follows one step commands with increased time      Cueing   Cueing Techniques: Verbal cues, Gestural cues, Tactile cues, Visual cues             Pertinent Vitals/ Pain       Pain Assessment Pain Assessment: Faces Faces Pain Scale: No hurt  Home Living                                    Additional Comments: Pt from [facility]          Frequency  Min 1X/week        Progress Toward Goals  OT Goals(current goals can now be found in the care plan section)  Progress towards OT goals: Progressing toward goals      AM-PAC OT "6 Clicks" Daily Activity     Outcome Measure   Help from another person eating meals?: A Little Help from another person taking care of personal grooming?: A Lot Help from another person toileting, which includes using toliet, bedpan, or urinal?: A Lot Help from another person bathing (including washing, rinsing, drying)?: A Lot Help from another person to put on and taking off regular upper body clothing?: A Lot Help from another person to put on and taking off regular lower body clothing?: A Lot 6 Click Score: 13    End of Session    OT Visit Diagnosis: Other abnormalities of gait and mobility (R26.89);Cognitive communication deficit (R41.841);Other symptoms and signs involving cognitive function Symptoms and signs involving cognitive functions: Other cerebrovascular disease   Activity Tolerance Patient tolerated treatment well   Patient Left with call bell/phone within reach;in bed;with bed alarm set   Nurse Communication Mobility status        Time: 0981-1914 OT Time Calculation (min): 10 min  Charges: OT General Charges $OT Visit: 1 Visit OT Treatments $Self Care/Home Management : 8-22 mins  George Kinder, MS, OTR/L , CBIS ascom [phone]  03/10/24, 3:46 PM

## 2024-03-10 NOTE — Plan of Care (Signed)

## 2024-03-10 NOTE — Progress Notes (Signed)
  Progress Note   Patient: Seth Thompson ZOX:[id number] DOB: 1946/01/31 DOA: 02/11/2024     28 DOS: the patient was seen and examined on 03/10/2024   Brief hospital course: Seth Thompson is a 78 y.o. male with medical history significant of essential hypertension, dementia, frequent PVCs, hyperlipidemia who was brought in from a facility after the patient lost consciousness while sitting on the shower chair at the facility.  He is admitted for further management and evaluation of altered mental status    Principal Problem:   Bradycardia with less than 30 beats per minute Active Problems:   Incarcerated right inguinal hernia   Syncope   Alzheimer's disease (HCC)   Assessment and Plan: Acute metabolic encephalopathy  Severe dementia with behavioral disturbances CT scan of the brain unremarkable. Patient still confused likely progressive dementia in the setting of pneumonia Normal TSH, B12 as well as folic acid levels. Patient is treated with Seroquel  every evening, also added melatonin. Patient has some sundowning at the evening, started Haldol  as needed.   Community-acquired pneumonia Chest x-ray showing findings of infiltrate --received Augmentin  for total 5 days.  Syncope in the setting of bradycardia-improved Upon arrival to the emergency room patient was found to be bradycardic in the 50s and had a drop of his pulse into the 20s. Echo showed EF 60 to 65% with normal diastolic parameters. Telemetry unremarkable. Avoid AV nodal blocking agents. Cardiologist advised no intervention of permanent pacemaker at this time.   Hypokalemia - resolved with replacement   Incarcerated right sided inguinal hernia No surgical intervention recommended by surgery team. Continue as needed pain medication   Essential hypertension, --cont amlodipine  and losartan    Hyperlipidemia -does not appear to be on statin   No change in treatment plan in the past week.  Pending  placement.    Subjective:  No new event.  Physical Exam: Vitals:   03/10/24 0439 03/10/24 0836 03/10/24 1637 03/10/24 1941  BP: 135/71 (!) 148/75 129/70 (!) 107/57  Pulse: (!) 48 92 (!) 57 (!) 58  Resp:  16 20 19   Temp: (!) 97.5 F (36.4 C) 97.6 F (36.4 C) 98 F (36.7 C) (!) 97.5 F (36.4 C)  TempSrc: Oral Oral Oral Oral  SpO2: 98% 99% 99% 100%  Weight:      Height:       Constitutional: NAD CV: No cyanosis.   RESP: normal respiratory effort, on RA Extremities: No effusions, edema in BLE SKIN: warm, dry  Data Reviewed:  There are no new results to review at this time.  Family Communication: None  Disposition: Status is: Inpatient Unsafe discharge.     Time spent: 25 minutes  Author: Garrison Kanner, MD 03/10/2024 8:51 PM  For on call review [email or web address].

## 2024-03-11 DIAGNOSIS — R001 Bradycardia, unspecified: Secondary | ICD-10-CM | POA: Diagnosis not present

## 2024-03-11 NOTE — Plan of Care (Signed)
   Problem: Health Behavior/Discharge Planning: Goal: Ability to manage health-related needs will improve Outcome: Progressing   Problem: Clinical Measurements: Goal: Ability to maintain clinical measurements within normal limits will improve Outcome: Progressing Goal: Will remain free from infection Outcome: Progressing Goal: Diagnostic test results will improve Outcome: Progressing Goal: Respiratory complications will improve Outcome: Progressing

## 2024-03-11 NOTE — TOC Progression Note (Addendum)
 Transition of Care [facility]) - Progression Note    Patient Details  Name: Seth Thompson MRN: [id number] Date of Birth: 10-10-1945  [facility]) CM/SW Contact  Loman Risk, RN Phone Number: 03/11/2024, 11:59 AM  Clinical Narrative:      still pending placement at [facility] with palliative  Update: noted patient ambulated 190 feet.  Spoke with Fredrik Jensen from DSS and is going to reach out to [place] [facility] [place] [facility] to see if they can reconsider patient returning   Expected Discharge Plan: Skilled Nursing Facility Barriers to Discharge: Continued Medical Work up  Expected Discharge Plan and Services       Living arrangements for the past 2 months: Assisted Living Facility                                       Social Determinants of Health (SDOH) Interventions SDOH Screenings   Food Insecurity: No Food Insecurity (02/11/2024)  Housing: Low Risk  (02/11/2024)  Transportation Needs: No Transportation Needs (02/11/2024)  Utilities: Not At Risk (02/11/2024)  Social Connections: Socially Isolated (02/11/2024)  Tobacco Use: Low Risk  (02/12/2024)    Readmission Risk Interventions     No data to display

## 2024-03-11 NOTE — Progress Notes (Signed)
 Occupational Therapy Treatment Patient Details Name: Seth Thompson MRN: [id number] DOB: 06-25-1946 Today's Date: 03/11/2024   History of present illness Pt is a 78 year old male admitted after LOC in shower, admitted with acute metabolic encephalopathy, syncope in the setting of bradycardia, hypokalemia, incarcerated right sided inguinal hernia. PMH significant for essential hypertension, dementia, frequent PVCs, and hyperlipidemia.   OT comments  Upon entering the room, pt seated in recliner chair. Pt follows commands intermittently with mod - max multimodal cuing. Room with minimal distractions to encourage initiation as pt appears to be internally and externally distracted. Pt stands from recliner chair with mod A after multiple attempt but no initiation from pt at first. Pt returns to seated chair and gestures to legs but unable to verbalize concerns with therapist. Pt drinks ensure with encouragement with R UE and washes face with max A to bring cloth to face and initiate. Pt remains in recliner chair at end of session with chair alarm and all needed items within reach.       If plan is discharge home, recommend the following:  Supervision due to cognitive status;Two people to help with walking and/or transfers;A lot of help with bathing/dressing/bathroom   Equipment Recommendations  Wheelchair (measurements OT);Wheelchair cushion (measurements OT)       Precautions / Restrictions Precautions Precautions: Fall Recall of Precautions/Restrictions: Impaired Restrictions Weight Bearing Restrictions Per Provider Order: No       Mobility Bed Mobility               General bed mobility comments: seated in recliner chair    Transfers Overall transfer level: Needs assistance Equipment used: 1 person hand held assist Transfers: Sit to/from Stand Sit to Stand: Mod assist                 Balance Overall balance assessment: Needs assistance Sitting-balance support: Feet  supported, No upper extremity supported Sitting balance-Leahy Scale: Fair     Standing balance support: Bilateral upper extremity supported Standing balance-Leahy Scale: Poor                             ADL either performed or assessed with clinical judgement   ADL Overall ADL's : Needs assistance/impaired Eating/Feeding: Minimal assistance;Cueing for sequencing;Cueing for safety;Sitting   Grooming: Sitting;Wash/dry face;Oral care;Maximal assistance                                      Extremity/Trunk Assessment Upper Extremity Assessment Upper Extremity Assessment: Generalized weakness   Lower Extremity Assessment Lower Extremity Assessment: Generalized weakness        Vision Patient Visual Report: No change from baseline           Communication Communication Communication: Impaired Factors Affecting Communication: Difficulty expressing self   Cognition Arousal: Alert Behavior During Therapy: Flat affect Cognition: History of cognitive impairments, Cognition impaired   Orientation impairments: Place, Time, Situation Awareness: Intellectual awareness impaired, Online awareness impaired Memory impairment (select all impairments): Short-term memory, Working [employer] fast streamer, Non-declarative long-term memory, Geneticist, molecular long-term memory Attention impairment (select first level of impairment): Focused attention Executive functioning impairment (select all impairments): Initiation, Organization, Sequencing, Reasoning, Problem solving                   Following commands: Impaired Following commands impaired: Follows one step commands inconsistently, Follows one step commands with increased time  Cueing   Cueing Techniques: Verbal cues, Gestural cues, Tactile cues, Visual cues        General Comments HR 66 bpm at end of session, pt was seated in recliner.    Pertinent Vitals/ Pain       Pain Assessment Pain Assessment: Faces Faces  Pain Scale: No hurt         Frequency  Min 1X/week        Progress Toward Goals  OT Goals(current goals can now be found in the care plan section)  Progress towards OT goals: Progressing toward goals      AM-PAC OT "6 Clicks" Daily Activity     Outcome Measure   Help from another person eating meals?: A Little Help from another person taking care of personal grooming?: A Lot Help from another person toileting, which includes using toliet, bedpan, or urinal?: A Lot Help from another person bathing (including washing, rinsing, drying)?: A Lot Help from another person to put on and taking off regular upper body clothing?: A Lot Help from another person to put on and taking off regular lower body clothing?: A Lot 6 Click Score: 13    End of Session    OT Visit Diagnosis: Other abnormalities of gait and mobility (R26.89);Cognitive communication deficit (R41.841);Other symptoms and signs involving cognitive function Symptoms and signs involving cognitive functions: Other cerebrovascular disease   Activity Tolerance Other (comment) (limited secondary to cognition)   Patient Left with call bell/phone within reach;in bed;with bed alarm set   Nurse Communication Mobility status        Time: 1610-9604 OT Time Calculation (min): 15 min  Charges: OT General Charges $OT Visit: 1 Visit OT Treatments $Self Care/Home Management : 8-22 mins  George Kinder, MS, OTR/L , CBIS ascom [phone]  03/11/24, 1:23 PM

## 2024-03-11 NOTE — Progress Notes (Signed)
  Progress Note   Patient: Seth Thompson ZOX:[id number] DOB: 1946-02-18 DOA: 02/11/2024     29 DOS: the patient was seen and examined on 03/11/2024   Brief hospital course: Seth Thompson is a 78 y.o. male with medical history significant of essential hypertension, dementia, frequent PVCs, hyperlipidemia who was brought in from a facility after the patient lost consciousness while sitting on the shower chair at the facility.  He is admitted for further management and evaluation of altered mental status    Principal Problem:   Bradycardia with less than 30 beats per minute Active Problems:   Incarcerated right inguinal hernia   Syncope   Alzheimer's disease (HCC)   Assessment and Plan: Acute metabolic encephalopathy  Severe dementia with behavioral disturbances CT scan of the brain unremarkable. Patient still confused likely progressive dementia in the setting of pneumonia Normal TSH, B12 as well as folic acid levels. Patient is treated with Seroquel  every evening, also added melatonin. Patient has some sundowning at the evening, started Haldol  as needed.   Community-acquired pneumonia Chest x-ray showing findings of infiltrate --received Augmentin  for total 5 days.  Syncope in the setting of bradycardia-improved Upon arrival to the emergency room patient was found to be bradycardic in the 50s and had a drop of his pulse into the 20s. Echo showed EF 60 to 65% with normal diastolic parameters. Telemetry unremarkable. Avoid AV nodal blocking agents. Cardiologist advised no intervention of permanent pacemaker at this time.   Hypokalemia - resolved with replacement   Incarcerated right sided inguinal hernia No surgical intervention recommended by surgery team. Continue as needed pain medication   Essential hypertension, --cont amlodipine  and losartan    Hyperlipidemia -does not appear to be on statin   No change in treatment plan in the past week.  Pending  placement.    Subjective:  No change.  Physical Exam: Vitals:   03/11/24 0427 03/11/24 0909 03/11/24 1130 03/11/24 1450  BP: 138/84 (!) 107/97  115/63  Pulse: 68 (!) 52 66 (!) 58  Resp: 16 16  16   Temp: 97.6 F (36.4 C) (!) 96.2 F (35.7 C)  98.8 F (37.1 C)  TempSrc: Oral Axillary    SpO2: 100% 99%  95%  Weight:      Height:       Constitutional: NAD CV: No cyanosis.   RESP: normal respiratory effort, on RA  Data Reviewed:  There are no new results to review at this time.  Family Communication: None  Disposition: Status is: Inpatient Unsafe discharge.     Time spent: 25 minutes  Author: Garrison Kanner, MD 03/11/2024 6:25 PM  For on call review [email or web address].

## 2024-03-11 NOTE — Progress Notes (Signed)
 Physical Therapy Treatment Patient Details Name: Seth Thompson MRN: [id number] DOB: 1946/03/14 Today's Date: 03/11/2024   History of Present Illness Pt is a 78 year old male admitted after LOC in shower, admitted with acute metabolic encephalopathy, syncope in the setting of bradycardia, hypokalemia, incarcerated right sided inguinal hernia. PMH significant for essential hypertension, dementia, frequent PVCs, and hyperlipidemia.    PT Comments  Pt was asleep in bed at the beginning of the session, once awoken he was pleasant and agreeable to treatment. No pain was noted throughout the session. During bed mobility pt required 2+ min/mod assist to come supine to sit, PT provided a plethora of cues (vc,tactile cues, and demonstrations) to initiate movement. Once at EOB, pt performed a STS with 2x hand hold assist, 1 PT providing moderate assist to transfer weight forward onto toes. Pt was able to ambulate 190 feet with 2x hand hold assist and w/c follow. Compared to previous session, pt did not demonstrate posterior lean during ambulation which may indicate improvement of strength and proprioception (head/trunk/feet relationship). PT continues to provide constant/rhythmic vc's for bigger steps, which inconsistently improves stride length. Pt was left seated in recliner with tray table in front of him, call bell and all necessities were left within reach. Pt will benefit from continued skilled PT services to increase his ambulation distance and make progression towards plof.    If plan is discharge home, recommend the following: Two people to help with walking and/or transfers;A lot of help with bathing/dressing/bathroom;Direct supervision/assist for medications management;Supervision due to cognitive status;Assist for transportation   Can travel by private vehicle     No  Equipment Recommendations  Other (comment) (Will be determined in subsequent sessions)    Recommendations for Other Services        Precautions / Restrictions Precautions Precautions: Fall Recall of Precautions/Restrictions: Impaired Restrictions Weight Bearing Restrictions Per Provider Order: No     Mobility  Bed Mobility Overal bed mobility: Needs Assistance Bed Mobility: Supine to Sit Rolling: +2 for physical assistance, Mod assist Sidelying to sit: +2 for physical assistance, Mod assist       General bed mobility comments: Pt required additional support at trunk and BLE to sit up EOB.Pt attempted to initiate movement without external support  to EOB-multiple unsuccessful trials were noted. PT provided vc's for weightshifitng, tactile cues for hand placement on rails to get CoG over BOS.    Transfers Overall transfer level: Needs assistance Equipment used: 2 person hand held assist Transfers: Sit to/from Stand, Bed to chair/wheelchair/BSC Sit to Stand: +2 physical assistance, Min assist, Mod assist           General transfer comment: STS required 2x hand hold assist to initiate movement and move CoG beyond inital limits of stability. PT provided vc "UP" multiple times to provide a rhythmic cue to initiate movement.    Ambulation/Gait Ambulation/Gait assistance: +2 physical assistance, Min assist (Hand hold assist) Gait Distance (Feet): 190 Feet Assistive device: 2 person hand held assist Gait Pattern/deviations: Step-through pattern, Decreased step length - right, Decreased step length - left, Step-to pattern Gait velocity: decreased     General Gait Details: PT provided constant vc"s for bigger steps, pt was able to demonstrate an increased stride length for the first 60 feet, reverting intermittently to shorter steps.   Stairs             Wheelchair Mobility     Tilt Bed    Modified Rankin (Stroke Patients Only)  Balance Overall balance assessment: Needs assistance Sitting-balance support: Feet supported, No upper extremity supported Sitting balance-Leahy Scale:  Fair Sitting balance - Comments: steady static sitting Postural control: Right lateral lean Standing balance support: Bilateral upper extremity supported Standing balance-Leahy Scale: Poor Standing balance comment: Intermittent R lateral lean, corrected with tactile cues on gait belt                            Communication Communication Communication: Impaired Factors Affecting Communication: Difficulty expressing self  Cognition Arousal: Alert (Pt was asleep upon entry, was woken easily with no residual symptoms) Behavior During Therapy: Flat affect   PT - Cognitive impairments: No family/caregiver present to determine baseline                       PT - Cognition Comments: Pt was asked to recall name, could not state/did not give an answer Following commands: Impaired Following commands impaired: Follows one step commands inconsistently, Follows one step commands with increased time    Cueing Cueing Techniques: Verbal cues, Gestural cues, Tactile cues, Visual cues  Exercises      General Comments General comments (skin integrity, edema, etc.): HR 66 bpm at end of session, pt was seated in recliner.      Pertinent Vitals/Pain Pain Assessment Pain Assessment: PAINAD Breathing: normal Negative Vocalization: none Facial Expression: smiling or inexpressive Body Language: relaxed Consolability: no need to console PAINAD Score: 0 Pain Intervention(s): Monitored during session    Home Living                          Prior Function            PT Goals (current goals can now be found in the care plan section) Acute Rehab PT Goals PT Goal Formulation: Patient unable to participate in goal setting Time For Goal Achievement: 03/12/24 Potential to Achieve Goals: Fair Progress towards PT goals: Progressing toward goals    Frequency    Min 1X/week      PT Plan      Co-evaluation              AM-PAC PT "6 Clicks" Mobility    Outcome Measure  Help needed turning from your back to your side while in a flat bed without using bedrails?: A Little Help needed moving from lying on your back to sitting on the side of a flat bed without using bedrails?: A Lot Help needed moving to and from a bed to a chair (including a wheelchair)?: A Lot Help needed standing up from a chair using your arms (e.g., wheelchair or bedside chair)?: A Lot Help needed to walk in hospital room?: Total Help needed climbing 3-5 steps with a railing? : Total 6 Click Score: 11    End of Session Equipment Utilized During Treatment: Gait belt Activity Tolerance: Patient tolerated treatment well Patient left: with call bell/phone within reach;with bed alarm set;Other (comment);in chair;with chair alarm set (Fall mat placed in front of pt. Pt was provided with necessities within reach on tray table, tele sitter readjusted to be in view of pt.) Nurse Communication: Mobility status;Precautions;Other (comment) (Nurse present during portions of treatment, verbalized approval of leaving pt in recliner in preparation for lunch.) PT Visit Diagnosis: Unsteadiness on feet (R26.81);Difficulty in walking, not elsewhere classified (R26.2);Muscle weakness (generalized) (M62.81)     Time: 1610-9604 PT Time Calculation (min) (ACUTE ONLY):  28 min  Charges:    $Gait Training: 8-22 mins $Therapeutic Activity: 8-22 mins PT General Charges $$ ACUTE PT VISIT: 1 VisitEmiliano Cherine Drumgoole, SPT                 Teoman Giraud 03/11/2024, 2:28 PM

## 2024-03-12 DIAGNOSIS — R001 Bradycardia, unspecified: Secondary | ICD-10-CM | POA: Diagnosis not present

## 2024-03-12 NOTE — TOC Progression Note (Signed)
 Transition of Care [facility]) - Progression Note    Patient Details  Name: Seth Thompson MRN: [id number] Date of Birth: Mar 03, 1946  [facility] CM/SW Contact  Mavery Milling A Eder Macek, RN Phone Number: 03/12/2024, 2:44 PM  Clinical Narrative:    Chart reviewed. I have spoken with [place] at [facility].  She informs me that per Administration they will reconsider patient returning to the facility.  Baruch Likens reports that facility will plan on coming to hospital to complete assessment on Monday.  TOC will continue to follow for discharge planning.     Expected Discharge Plan: Skilled Nursing Facility Barriers to Discharge: Continued Medical Work up  Expected Discharge Plan and Services       Living arrangements for the past 2 months: Assisted Living Facility                                       Social Determinants of Health (SDOH) Interventions SDOH Screenings   Food Insecurity: No Food Insecurity (02/11/2024)  Housing: Low Risk  (02/11/2024)  Transportation Needs: No Transportation Needs (02/11/2024)  Utilities: Not At Risk (02/11/2024)  Social Connections: Socially Isolated (02/11/2024)  Tobacco Use: Low Risk  (02/12/2024)    Readmission Risk Interventions     No data to display

## 2024-03-12 NOTE — Progress Notes (Signed)
  Progress Note   Patient: Seth Thompson:[id number] DOB: 11/23/1945 DOA: 02/11/2024     30 DOS: the patient was seen and examined on 03/12/2024   Brief hospital course: Seth Thompson is a 78 y.o. male with medical history significant of essential hypertension, dementia, frequent PVCs, hyperlipidemia who was brought in from a facility after the patient lost consciousness while sitting on the shower chair at the facility.  He is admitted for further management and evaluation of altered mental status    Principal Problem:   Bradycardia with less than 30 beats per minute Active Problems:   Incarcerated right inguinal hernia   Syncope   Alzheimer's disease (HCC)   Assessment and Plan: Acute metabolic encephalopathy  Severe dementia with behavioral disturbances CT scan of the brain unremarkable. Patient still confused likely progressive dementia in the setting of pneumonia Normal TSH, B12 as well as folic acid levels. Patient is treated with Seroquel  every evening, also added melatonin. Patient has some sundowning at the evening, started Haldol  as needed.   Community-acquired pneumonia Chest x-ray showing findings of infiltrate --received Augmentin  for total 5 days.  Syncope in the setting of bradycardia-improved Upon arrival to the emergency room patient was found to be bradycardic in the 50s and had a drop of his pulse into the 20s. Echo showed EF 60 to 65% with normal diastolic parameters. Telemetry unremarkable. Avoid AV nodal blocking agents. Cardiologist advised no intervention of permanent pacemaker at this time.   Hypokalemia - resolved with replacement   Incarcerated right sided inguinal hernia No surgical intervention recommended by surgery team. Continue as needed pain medication   Essential hypertension, --cont amlodipine  and losartan    Hyperlipidemia -does not appear to be on statin   No change in treatment plan in the past week.  Pending  placement.    Subjective:  No change.  Physical Exam: Vitals:   03/11/24 2100 03/12/24 0357 03/12/24 0839 03/12/24 1523  BP: 132/69 131/82 123/78 107/60  Pulse: (!) 55 (!) 51 (!) 53 63  Resp: 17 18 19 19   Temp: 98.3 F (36.8 C) (!) (P) 97.1 F (36.2 C) (!) 97.1 F (36.2 C) 98.9 F (37.2 C)  TempSrc: Axillary (P) Axillary    SpO2: 97% 96%  99%  Weight:      Height:       Constitutional: NAD, awake HEENT: conjunctivae and lids normal, EOMI CV: No cyanosis.   RESP: normal respiratory effort, on RA   Data Reviewed:  There are no new results to review at this time.  Family Communication: None  Disposition: Status is: Inpatient Unsafe discharge.     Time spent: 25 minutes  Author: Garrison Kanner, MD 03/12/2024 6:33 PM  For on call review [email or web address].

## 2024-03-12 NOTE — Plan of Care (Signed)
  Problem: Education: Goal: Knowledge of General Education information will improve Description: Including pain rating scale, medication(s)/side effects and non-pharmacologic comfort measures Outcome: Progressing   Problem: Clinical Measurements: Goal: Ability to maintain clinical measurements within normal limits will improve Outcome: Progressing Goal: Diagnostic test results will improve Outcome: Progressing Goal: Cardiovascular complication will be avoided Outcome: Progressing   Problem: Nutrition: Goal: Adequate nutrition will be maintained Outcome: Progressing   

## 2024-03-12 NOTE — Plan of Care (Signed)

## 2024-03-12 NOTE — Progress Notes (Signed)
 Mobility Specialist - Progress Note   03/12/24 1024  Mobility  Activity Ambulated with assistance in hallway  Level of Assistance Contact guard assist, steadying assist (B HHA)  Assistive Device None  Distance Ambulated (ft) 160 ft  Activity Response Tolerated well  Mobility visit 1 Mobility  Mobility Specialist Start Time (ACUTE ONLY) 1002  Mobility Specialist Stop Time (ACUTE ONLY) 1021  Mobility Specialist Time Calculation (min) (ACUTE ONLY) 19 min   Pt semi fowler upon entry, utilizing RA--- easily awakened. Pt agreeable to OOB acitivty this date. Pt required multimodal cueing for task initiation throughout session. Pt completed bed mob Min-ModA +2 to bring trunk from sup to sit and BLE EOB. Pt STS ModA +2, amb one lap around the NS with B hand hold assist and chair follow (not utilized)--- no post lean noted this date. Pt returned to the room, required tactile and VC to sit in the recliner, ultimately requiring MinA to return seated. Pt left seated in the recliner with alarm set and needs within reach. RN notified.  Versa Gore Mobility Specialist 03/12/24 10:39 AM

## 2024-03-13 DIAGNOSIS — R001 Bradycardia, unspecified: Secondary | ICD-10-CM | POA: Diagnosis not present

## 2024-03-13 MED ORDER — QUETIAPINE FUMARATE ER 50 MG PO TB24
100.0000 mg | ORAL_TABLET | Freq: Every day | ORAL | Status: DC
  Administered 2024-03-13 – 2024-03-14 (×2): 100 mg via ORAL
  Filled 2024-03-13 (×3): qty 2

## 2024-03-13 MED ORDER — HALOPERIDOL LACTATE 5 MG/ML IJ SOLN
2.0000 mg | Freq: Four times a day (QID) | INTRAMUSCULAR | Status: DC | PRN
  Administered 2024-03-22: 2 mg via INTRAMUSCULAR
  Filled 2024-03-13 (×2): qty 1

## 2024-03-13 NOTE — Plan of Care (Signed)
     Referral previously received for Seth Thompson for goals of care discussion. Chart reviewed and updates received from RN. See last PMT note dated 02/16/24.  Chart reviewed and no significant clinical changes or ongoing palliative needs noted. At this time goals are clear. We will sign off for now and discontinue consult order. Please contact us  and enter a new consult order for any new palliative care needs.  Thank you for your referral and allowing PMT to assist in Seth Thompson's care.   Seth Thompson, AGNP Palliative Medicine Team Phone: [phone]  NO CHARGE

## 2024-03-13 NOTE — Progress Notes (Signed)
  Progress Note   Patient: Seth Thompson ZOX:[id number] DOB: 08-Mar-1946 DOA: 02/11/2024     31 DOS: the patient was seen and examined on 03/13/2024   Brief hospital course: CORTLAND CREHAN is a 78 y.o. male with medical history significant of essential hypertension, dementia, frequent PVCs, hyperlipidemia who was brought in from a facility after the patient lost consciousness while sitting on the shower chair at the facility.  He is admitted for further management and evaluation of altered mental status    Principal Problem:   Bradycardia with less than 30 beats per minute Active Problems:   Incarcerated right inguinal hernia   Syncope   Alzheimer's disease (HCC)   Assessment and Plan: Acute metabolic encephalopathy  Severe dementia with behavioral disturbances Sun-downing CT scan of the brain unremarkable. Patient still confused likely progressive dementia in the setting of pneumonia Normal TSH, B12 as well as folic acid levels. --increase seroquel  to 100 mg nightly Patient has some sundowning at the evening, started Haldol  as needed.   Community-acquired pneumonia Chest x-ray showing findings of infiltrate --received Augmentin  for total 5 days.  Syncope in the setting of bradycardia-improved Upon arrival to the emergency room patient was found to be bradycardic in the 50s and had a drop of his pulse into the 20s. Echo showed EF 60 to 65% with normal diastolic parameters. Telemetry unremarkable. Avoid AV nodal blocking agents. Cardiologist advised no intervention of permanent pacemaker at this time.   Hypokalemia - resolved with replacement   Incarcerated right sided inguinal hernia No surgical intervention recommended by surgery team. Continue as needed pain medication   Essential hypertension, --cont amlodipine  and losartan    Hyperlipidemia -does not appear to be on statin   No change in treatment plan in the past week.  Pending placement.    Subjective:   Towards evening time, pt became agitated, which was unusual for pt.   Physical Exam: Vitals:   03/13/24 0401 03/13/24 0842 03/13/24 0915 03/13/24 1710  BP: (!) 98/53 123/75 123/75 (!) 126/54  Pulse: 66 68  (!) 55  Resp: 20 19  17   Temp: 98.5 F (36.9 C) 98.7 F (37.1 C)  98.2 F (36.8 C)  TempSrc:      SpO2: 99% 100%  100%  Weight:      Height:       Constitutional: NAD CV: No cyanosis.   RESP: normal respiratory effort, on RA Extremities: No effusions, edema in BLE SKIN: warm, dry   Data Reviewed:  There are no new results to review at this time.  Family Communication: None  Disposition: Status is: Inpatient Unsafe discharge.     Time spent: 35 minutes  Author: Garrison Kanner, MD 03/13/2024 6:27 PM  For on call review [email or web address].

## 2024-03-13 NOTE — Plan of Care (Signed)
 Pt alert to name only, mostly redirectable except during periods of trying to clean up pt. Pt became agitated and wanted to get out of bed, MD notified and Seroquel  and Haldol  order modified. Pt able to feed self after set up. Speech is clear with incoherent and rambling speech at times.       Problem: Education: Goal: Knowledge of General Education information will improve Description: Including pain rating scale, medication(s)/side effects and non-pharmacologic comfort measures Outcome: Progressing   Problem: Health Behavior/Discharge Planning: Goal: Ability to manage health-related needs will improve Outcome: Progressing   Problem: Clinical Measurements: Goal: Ability to maintain clinical measurements within normal limits will improve Outcome: Progressing Goal: Will remain free from infection Outcome: Progressing Goal: Diagnostic test results will improve Outcome: Progressing Goal: Respiratory complications will improve Outcome: Progressing Goal: Cardiovascular complication will be avoided Outcome: Progressing   Problem: Activity: Goal: Risk for activity intolerance will decrease Outcome: Progressing   Problem: Nutrition: Goal: Adequate nutrition will be maintained Outcome: Progressing   Problem: Coping: Goal: Level of anxiety will decrease Outcome: Progressing   Problem: Elimination: Goal: Will not experience complications related to bowel motility Outcome: Progressing Goal: Will not experience complications related to urinary retention Outcome: Progressing   Problem: Pain Managment: Goal: General experience of comfort will improve and/or be controlled Outcome: Progressing   Problem: Safety: Goal: Ability to remain free from injury will improve Outcome: Progressing   Problem: Skin Integrity: Goal: Risk for impaired skin integrity will decrease Outcome: Progressing   Problem: Safety: Goal: Non-violent Restraint(s) Outcome: Progressing

## 2024-03-14 DIAGNOSIS — R001 Bradycardia, unspecified: Secondary | ICD-10-CM | POA: Diagnosis not present

## 2024-03-14 NOTE — Progress Notes (Signed)
  Progress Note   Patient: LUCILLE CRICHLOW WUJ:[id number] DOB: [birth year] DOA: 02/11/2024     32 DOS: the patient was seen and examined on 03/14/2024   Brief hospital course: GERROD MAULE is a 78 y.o. male with medical history significant of essential hypertension, dementia, frequent PVCs, hyperlipidemia who was brought in from a facility after the patient lost consciousness while sitting on the shower chair at the facility.  He is admitted for further management and evaluation of altered mental status    Principal Problem:   Bradycardia with less than 30 beats per minute Active Problems:   Incarcerated right inguinal hernia   Syncope   Alzheimer's disease (HCC)   Assessment and Plan: Acute metabolic encephalopathy  Severe dementia with behavioral disturbances Sun-downing CT scan of the brain unremarkable. Patient still confused likely progressive dementia in the setting of pneumonia Normal TSH, B12 as well as folic acid levels. --increase seroquel  to 100 mg nightly Patient has some sundowning at the evening, started Haldol  as needed.   Community-acquired pneumonia Chest x-ray showing findings of infiltrate --received Augmentin  for total 5 days.  Syncope in the setting of bradycardia-improved Upon arrival to the emergency room patient was found to be bradycardic in the 50s and had a drop of his pulse into the 20s. Echo showed EF 60 to 65% with normal diastolic parameters. Telemetry unremarkable. Avoid AV nodal blocking agents. Cardiologist advised no intervention of permanent pacemaker at this time.   Hypokalemia - resolved with replacement   Incarcerated right sided inguinal hernia No surgical intervention recommended by surgery team. Continue as needed pain medication   Essential hypertension, --cont amlodipine  and losartan    Hyperlipidemia -does not appear to be on statin   No change in treatment plan in the past week.  Pending placement.    Subjective:   No new event today.   Physical Exam: Vitals:   03/13/24 1710 03/13/24 2007 03/14/24 0331 03/14/24 0825  BP: (!) 126/54 (!) 109/57 137/77 (!) 158/90  Pulse: (!) 55 95 71 61  Resp: 17 17 18 20   Temp: 98.2 F (36.8 C) 97.8 F (36.6 C) 97.7 F (36.5 C) (!) 97.5 F (36.4 C)  TempSrc:  Oral Oral   SpO2: 100% 92% 97% 98%  Weight:      Height:        Constitutional: NAD CV: No cyanosis.   RESP: normal respiratory effort, on RA Extremities: No effusions, edema in BLE SKIN: warm, dry   Data Reviewed:  There are no new results to review at this time.  Family Communication: None  Disposition: Status is: Inpatient Unsafe discharge.     Time spent: 25 minutes  Author: Garrison Kanner, MD 03/14/2024 7:51 PM  For on call review [email or web address].

## 2024-03-14 NOTE — Plan of Care (Signed)

## 2024-03-15 DIAGNOSIS — R001 Bradycardia, unspecified: Secondary | ICD-10-CM | POA: Diagnosis not present

## 2024-03-15 DIAGNOSIS — I10 Essential (primary) hypertension: Secondary | ICD-10-CM | POA: Diagnosis not present

## 2024-03-15 DIAGNOSIS — F411 Generalized anxiety disorder: Secondary | ICD-10-CM | POA: Diagnosis not present

## 2024-03-15 MED ORDER — QUETIAPINE FUMARATE ER 50 MG PO TB24
50.0000 mg | ORAL_TABLET | Freq: Every day | ORAL | Status: DC
  Administered 2024-03-15 – 2024-04-01 (×16): 50 mg via ORAL
  Filled 2024-03-15 (×19): qty 1

## 2024-03-15 NOTE — TOC Progression Note (Signed)
 Transition of Care [facility]) - Progression Note    Patient Details  Name: Seth Thompson MRN: [id number] Date of Birth: 02/19/1946  [facility] CM/SW Contact  Loman Risk, RN Phone Number: 03/15/2024, 12:02 PM  Clinical Narrative:     Message left for Baruch Likens at [facility] to confirm they will be assessing patient today  Expected Discharge Plan: Skilled Nursing Facility Barriers to Discharge: Continued Medical Work up  Expected Discharge Plan and Services       Living arrangements for the past 2 months: Assisted Living Facility                                       Social Determinants of Health (SDOH) Interventions SDOH Screenings   Food Insecurity: No Food Insecurity (02/11/2024)  Housing: Low Risk  (02/11/2024)  Transportation Needs: No Transportation Needs (02/11/2024)  Utilities: Not At Risk (02/11/2024)  Social Connections: Socially Isolated (02/11/2024)  Tobacco Use: Low Risk  (02/12/2024)    Readmission Risk Interventions     No data to display

## 2024-03-15 NOTE — Plan of Care (Signed)
  Problem: Clinical Measurements: Goal: Ability to maintain clinical measurements within normal limits will improve Outcome: Progressing Goal: Will remain free from infection Outcome: Progressing Goal: Diagnostic test results will improve Outcome: Progressing Goal: Respiratory complications will improve Outcome: Progressing Goal: Cardiovascular complication will be avoided Outcome: Progressing   Problem: Activity: Goal: Risk for activity intolerance will decrease Outcome: Progressing   Problem: Nutrition: Goal: Adequate nutrition will be maintained Outcome: Progressing   Problem: Coping: Goal: Level of anxiety will decrease Outcome: Progressing   Problem: Elimination: Goal: Will not experience complications related to bowel motility Outcome: Progressing Goal: Will not experience complications related to urinary retention Outcome: Progressing   Problem: Pain Managment: Goal: General experience of comfort will improve and/or be controlled Outcome: Progressing   Problem: Safety: Goal: Ability to remain free from injury will improve Outcome: Progressing   Problem: Skin Integrity: Goal: Risk for impaired skin integrity will decrease Outcome: Progressing   Problem: Safety: Goal: Non-violent Restraint(s) Outcome: Progressing

## 2024-03-15 NOTE — Progress Notes (Signed)
  Progress Note   Patient: Seth Thompson WJX:[id number] DOB: 01-28-1946 DOA: 02/11/2024     33 DOS: the patient was seen and examined on 03/15/2024   Brief hospital course: Seth Thompson is a 78 y.o. male with medical history significant of essential hypertension, dementia, frequent PVCs, hyperlipidemia who was brought in from a facility after the patient lost consciousness while sitting on the shower chair at the facility.  He is admitted for further management and evaluation of altered mental status    Principal Problem:   Bradycardia with less than 30 beats per minute Active Problems:   Incarcerated right inguinal hernia   Syncope   Alzheimer's disease (HCC)   Assessment and Plan: Acute metabolic encephalopathy  Severe dementia with behavioral disturbances Sun-downing CT scan of the brain unremarkable. Patient still confused likely progressive dementia in the setting of pneumonia Normal TSH, B12 as well as folic acid levels. --increase seroquel  to 100 mg nightly Patient has some sundowning at the evening, started Haldol  as needed.   Community-acquired pneumonia Chest x-ray showing findings of infiltrate --received Augmentin  for total 5 days.  Syncope in the setting of bradycardia-improved Upon arrival to the emergency room patient was found to be bradycardic in the 50s and had a drop of his pulse into the 20s. Echo showed EF 60 to 65% with normal diastolic parameters. Telemetry unremarkable. Avoid AV nodal blocking agents. Cardiologist advised no intervention of permanent pacemaker at this time.   Hypokalemia - resolved with replacement   Incarcerated right sided inguinal hernia No surgical intervention recommended by surgery team. Continue as needed pain medication   Essential hypertension, --cont amlodipine  and losartan    Hyperlipidemia -does not appear to be on statin   No change in treatment plan in the past week.  Pending placement.    Subjective:   Requested 2 walk sessions per day for pt, however, staff reported pt gets tired out after just 1 mobility session.   Physical Exam: Vitals:   03/15/24 0353 03/15/24 0759 03/15/24 0937 03/15/24 1127  BP: 108/63 (!) 155/76 (!) 155/76 114/70  Pulse: 77 65  (!) 58  Resp: 17 17  16   Temp: 98.2 F (36.8 C) 98.4 F (36.9 C)  98.6 F (37 C)  TempSrc: Axillary Axillary  Axillary  SpO2: 98% 94%  99%  Weight:      Height:        Constitutional: NAD CV: No cyanosis.   RESP: normal respiratory effort, on RA Extremities: No effusions, edema in BLE SKIN: warm, dry   Data Reviewed:  There are no new results to review at this time.  Family Communication: None  Disposition: Status is: Inpatient Unsafe discharge.     Time spent: 25 minutes  Author: Garrison Kanner, MD 03/15/2024 5:10 PM  For on call review [email or web address].

## 2024-03-15 NOTE — Evaluation (Signed)
 Physical Therapy Re-Evaluation Patient Details Name: Seth Thompson MRN: [id number] DOB: 04/08/1946 Today's Date: 03/15/2024  History of Present Illness  Pt is a 78 year old male admitted after LOC in shower, admitted with acute metabolic encephalopathy, syncope in the setting of bradycardia, hypokalemia, incarcerated right sided inguinal hernia. PMH significant for essential hypertension, dementia, frequent PVCs, and hyperlipidemia.  Clinical Impression  PT performed a re-evaluation due to extended length of stay. PT focused session on AD integration to lessen PT's extracorporeal support. Pt continues to require additional support for bed mobility and transfers (mod A, constant vc's, tactile cues, and demonstrative cues to initiate movement). During ambulation, PT trialed multiple AD (RW and SPC), pt was unable to accept their use/demonstrate significant improvement with gait deviations previously noted. Pt ambulated 10 ft with a RW and CGA, step length was very small and forward progression was very minimal prompting trial with SPC. SPC was not accepted by pt (refused to maintain it in hand). Following unsuccessful AD integration, pt ambulated 160 ft in two bouts (70 ft with 2+ hand held assist , 20 ft with a leading handheld assist, and 50 ft with 2+hand held assist). Compared to previous session, pt's gait deviations were more present today with increased shuffling and decreased gait speed despite constant vc's for bigger steps. Due to aforementioned limitations and medical condition, PT updated goals and POC to meet pt's needs. Pt will benefit from skilled therapeutic interventions to return to PLOF.          If plan is discharge home, recommend the following: Two people to help with walking and/or transfers;A lot of help with bathing/dressing/bathroom;Direct supervision/assist for medications management;Supervision due to cognitive status;Assist for transportation   Can travel by private  vehicle   No    Equipment Recommendations None recommended by PT  Recommendations for Other Services       Functional Status Assessment Patient has had a recent decline in their functional status and/or demonstrates limited ability to make significant improvements in function in a reasonable and predictable amount of time     Precautions / Restrictions Precautions Precautions: Fall Recall of Precautions/Restrictions: Impaired Restrictions Weight Bearing Restrictions Per Provider Order: No      Mobility  Bed Mobility Overal bed mobility: Needs Assistance Bed Mobility: Supine to Sit     Supine to sit: Mod assist     General bed mobility comments: Required additional assistance to square hips, in order to get his feet supported and to be able to sit EOB.    Transfers Overall transfer level: Needs assistance Equipment used: Rolling walker (2 wheels), 1 person hand held assist Transfers: Sit to/from Stand Sit to Stand: Mod assist           General transfer comment: STS required hand hold assist to initiate movement. PT continues to provide vc "UP" multiple times to provide a rhythmic cue to initiate movement.    Ambulation/Gait Ambulation/Gait assistance: +2 physical assistance, Min assist, Contact guard assist   Assistive device: 2 person hand held assist, Rolling walker (2 wheels), Straight cane Gait Pattern/deviations: Step-through pattern, Decreased step length - right, Decreased step length - left, Step-to pattern, Knee flexed in stance - right, Knee flexed in stance - left, Shuffle Gait velocity: decreased     General Gait Details: PT provided constant vc's for bigger steps, pt did not show improvements in stride length today. PT trialed multiple AD and pt was unable to accept their use/demonstrate significant improvement with gait deviations listed  above. Pt ambulated 10 ft with a RW and CGA, steps were very small and forward progression was very minimal. Pt  attempted to use a SPC which pt did not tolerate at all, refused to maintain it in hand. Pt ambulated 160 ft in two bouts (70 ft with 2+ hand held assist , 20 ft with a leading handheld assist, and 50 ft with 2+hand held assist.)Pt required w/c follow throughout all bouts of ambulation.  Stairs            Wheelchair Mobility     Tilt Bed    Modified Rankin (Stroke Patients Only)       Balance Overall balance assessment: Needs assistance Sitting-balance support: Feet supported, No upper extremity supported Sitting balance-Leahy Scale: Fair Sitting balance - Comments: steady static sitting   Standing balance support: Bilateral upper extremity supported Standing balance-Leahy Scale: Poor Standing balance comment: Was able to maintian quiet stance for ~ 1 minute with BUE on RW before ambulating. During swing phase of gait intermittent R lateral lean, corrected with tactile cues. During stance phase bilateral increased knee flexion with fatigue.                             Pertinent Vitals/Pain Pain Assessment Pain Assessment: PAINAD Breathing: normal Negative Vocalization: none Facial Expression: smiling or inexpressive Body Language: relaxed Consolability: no need to console PAINAD Score: 0 Pain Intervention(s): Monitored during session    Home Living Family/patient expects to be discharged to:: Other (Comment)                   Additional Comments: Pt from [employer]    Prior Function                       Extremity/Trunk Assessment   Upper Extremity Assessment Upper Extremity Assessment: Defer to OT evaluation    Lower Extremity Assessment Lower Extremity Assessment: Generalized weakness       Communication   Communication Communication: Impaired Factors Affecting Communication: Difficulty expressing self    Cognition Arousal: Alert Behavior During Therapy: Flat affect   PT - Cognitive impairments: No family/caregiver  present to determine baseline                       PT - Cognition Comments: Pt was asked to recall name, was able to state first name on 2 seperate occasions. Following commands: Impaired Following commands impaired: Follows one step commands inconsistently, Follows one step commands with increased time     Cueing Cueing Techniques: Verbal cues, Gestural cues, Tactile cues, Visual cues     General Comments General comments (skin integrity, edema, etc.): Pre session: HR:58 bpm, BP: 114/70 (supine) SpO2: 99+% on room air, Post session HR: 66 bpm SpO2:95+% on room air.    Exercises     Assessment/Plan    PT Assessment Patient needs continued PT services  PT Problem List Decreased strength;Decreased balance;Decreased activity tolerance;Decreased mobility;Decreased cognition;Decreased safety awareness       PT Treatment Interventions DME instruction;Gait training;Functional mobility training;Therapeutic activities;Therapeutic exercise;Balance training;Patient/family education    PT Goals (Current goals can be found in the Care Plan section)  Acute Rehab PT Goals PT Goal Formulation: Patient unable to participate in goal setting Time For Goal Achievement: 03/12/24 Potential to Achieve Goals: Fair    Frequency Min 2X/week     Co-evaluation  AM-PAC PT "6 Clicks" Mobility  Outcome Measure Help needed turning from your back to your side while in a flat bed without using bedrails?: A Little Help needed moving from lying on your back to sitting on the side of a flat bed without using bedrails?: A Lot Help needed moving to and from a bed to a chair (including a wheelchair)?: A Lot Help needed standing up from a chair using your arms (e.g., wheelchair or bedside chair)?: A Lot Help needed to walk in hospital room?: Total Help needed climbing 3-5 steps with a railing? : Total 6 Click Score: 11    End of Session Equipment Utilized During Treatment: Gait  belt Activity Tolerance: Patient tolerated treatment well Patient left: with call bell/phone within reach;Other (comment);in chair;with chair alarm set, fall mat placed in front of recliner.  Nurse Communication: Mobility status PT Visit Diagnosis: Unsteadiness on feet (R26.81);Difficulty in walking, not elsewhere classified (R26.2);Muscle weakness (generalized) (M62.81)    Time: 1122-1206 PT Time Calculation (min) (ACUTE ONLY): 44 min   Charges:                Aamilah Augenstein, SPT  Husna Krone 03/15/2024, 2:27 PM

## 2024-03-16 DIAGNOSIS — R001 Bradycardia, unspecified: Secondary | ICD-10-CM | POA: Diagnosis not present

## 2024-03-16 NOTE — Progress Notes (Signed)
 Occupational Therapy Treatment Patient Details Name: Seth Thompson MRN: [id number] DOB: Dec 24, 1945 Today's Date: 03/16/2024   History of present illness Pt is a 78 year old male admitted after LOC in shower, admitted with acute metabolic encephalopathy, syncope in the setting of bradycardia, hypokalemia, incarcerated right sided inguinal hernia. PMH significant for essential hypertension, dementia, frequent PVCs, and hyperlipidemia.   OT comments  Pt seen for OT tx. Pt received in recliner, appears pleasant, smiles as therapist enters the room. Pt does not appear to be in pain/discomfort. Pt required visual demo paired with simple verbal cue to wash his face with set up of the washcloth, ultimately requiring MIN A for thoroughness. Hand over hand assistance to initiate and continue brushing teeth, easily distracted attempting to converse with therapist. Ultimately requiring MOD A for thoroughness. Pt continues to require increased assist for ADL. Continue to recommend additional skilled OT services to maximize safety/indep and minimize functional decline and caregiver burden.       If plan is discharge home, recommend the following:  Supervision due to cognitive status;Two people to help with walking and/or transfers;A lot of help with bathing/dressing/bathroom;Direct supervision/assist for medications management;Direct supervision/assist for financial management;Assist for transportation;Assistance with cooking/housework;Help with stairs or ramp for entrance;Assistance with feeding   Equipment Recommendations  Wheelchair (measurements OT);Wheelchair cushion (measurements OT)    Recommendations for Other Services      Precautions / Restrictions Precautions Precautions: Fall Recall of Precautions/Restrictions: Impaired Restrictions Weight Bearing Restrictions Per Provider Order: No       Mobility Bed Mobility                    Transfers                          Balance                                           ADL either performed or assessed with clinical judgement   ADL Overall ADL's : Needs assistance/impaired     Grooming: Sitting;Wash/dry face;Oral care;Maximal assistance Grooming Details (indicate cue type and reason): visual demo paired with simple verbal cue provided to wash face, requiring MIN A for thoroughness. Hand over hand assistance to initiate and continue brushing teeth, easily distracted attempting to converse with therapist. Ultimately requiring MOD A for thoroughness.                                    Extremity/Trunk Assessment              Vision       Restaurant manager, fast food Communication: Impaired Factors Affecting Communication: Difficulty expressing self   Cognition Arousal: Alert Behavior During Therapy: WFL for tasks assessed/performed Cognition: History of cognitive impairments             OT - Cognition Comments: requires simple repeated multimodal cues for 1 step directions, easily distracted                 Following commands: Impaired Following commands impaired: Follows one step commands inconsistently, Follows one step commands with increased time      Cueing   Cueing Techniques: Verbal cues, Gestural cues, Tactile cues, Visual cues  Exercises  Shoulder Instructions       General Comments      Pertinent Vitals/ Pain       Pain Assessment Pain Assessment: PAINAD Breathing: normal Negative Vocalization: none Facial Expression: smiling or inexpressive Body Language: relaxed Consolability: no need to console PAINAD Score: 0 Pain Intervention(s): Monitored during session  Home Living                                          Prior Functioning/Environment              Frequency  Min 1X/week        Progress Toward Goals  OT Goals(current goals can now be found in the  care plan section)  Progress towards OT goals: Progressing toward goals  Acute Rehab OT Goals OT Goal Formulation: Patient unable to participate in goal setting Time For Goal Achievement: 03/26/24 Potential to Achieve Goals: Fair  Plan      Co-evaluation                 AM-PAC OT "6 Clicks" Daily Activity     Outcome Measure   Help from another person eating meals?: A Little Help from another person taking care of personal grooming?: A Lot Help from another person toileting, which includes using toliet, bedpan, or urinal?: A Lot Help from another person bathing (including washing, rinsing, drying)?: A Lot Help from another person to put on and taking off regular upper body clothing?: A Lot Help from another person to put on and taking off regular lower body clothing?: A Lot 6 Click Score: 13    End of Session    OT Visit Diagnosis: Other abnormalities of gait and mobility (R26.89);Cognitive communication deficit (R41.841);Other symptoms and signs involving cognitive function Symptoms and signs involving cognitive functions: Other cerebrovascular disease   Activity Tolerance Patient tolerated treatment well;Other (comment) (limited by cognition)   Patient Left in chair;with call bell/phone within reach;with chair alarm set   Nurse Communication          Time: 4098-1191 OT Time Calculation (min): 17 min  Charges: OT General Charges $OT Visit: 1 Visit OT Treatments $Self Care/Home Management : 8-22 mins  Berenda Breaker., MPH, MS, OTR/L ascom [phone] 03/16/24, 3:03 PM

## 2024-03-16 NOTE — Plan of Care (Signed)

## 2024-03-16 NOTE — TOC Progression Note (Signed)
 Transition of Care [facility]) - Progression Note    Patient Details  Name: Seth Thompson MRN: [id number] Date of Birth: 10/30/1945  [facility] CM/SW Contact  Loman Risk, RN Phone Number: 03/16/2024, 10:50 AM  Clinical Narrative:    Per Fredrik Jensen with DSS, Catina with [facility] said they still can not meet patients needs and will need additional therapy prior to returning.  Per Fredrik Jensen with Dss she is going to check back in with guardian of the estate, and if she does not receive a response she is going to go to the office.  MD and [facility] supervisor updated    Expected Discharge Plan: Skilled Nursing Facility Barriers to Discharge: Continued Medical Work up  Expected Discharge Plan and Services       Living arrangements for the past 2 months: Assisted Living Facility                                       Social Determinants of Health (SDOH) Interventions SDOH Screenings   Food Insecurity: No Food Insecurity (02/11/2024)  Housing: Low Risk  (02/11/2024)  Transportation Needs: No Transportation Needs (02/11/2024)  Utilities: Not At Risk (02/11/2024)  Social Connections: Socially Isolated (02/11/2024)  Tobacco Use: Low Risk  (02/12/2024)    Readmission Risk Interventions     No data to display

## 2024-03-16 NOTE — Progress Notes (Signed)
  Progress Note   Patient: Seth Thompson:[id number] DOB: [birth year] DOA: 02/11/2024     34 DOS: the patient was seen and examined on 03/16/2024   Brief hospital course: LIONAL ICENOGLE is a 78 y.o. male with medical history significant of essential hypertension, dementia, frequent PVCs, hyperlipidemia who was brought in from a facility after the patient lost consciousness while sitting on the shower chair at the facility.  He is admitted for further management and evaluation of altered mental status    Principal Problem:   Bradycardia with less than 30 beats per minute Active Problems:   Incarcerated right inguinal hernia   Syncope   Alzheimer's disease (HCC)   Assessment and Plan: Acute metabolic encephalopathy  Severe dementia with behavioral disturbances Sun-downing CT scan of the brain unremarkable. Patient still confused likely progressive dementia in the setting of pneumonia Normal TSH, B12 as well as folic acid levels. Patient has intermittent sundowning at the evening, --cont seroquel  50 mg nightly --Haldol  IM PRN   Community-acquired pneumonia Chest x-ray showing findings of infiltrate --received Augmentin  for total 5 days.  Syncope in the setting of bradycardia-improved Upon arrival to the emergency room patient was found to be bradycardic in the 50s and had a drop of his pulse into the 20s. Echo showed EF 60 to 65% with normal diastolic parameters. Telemetry unremarkable. Avoid AV nodal blocking agents. Cardiologist advised no intervention of permanent pacemaker at this time.   Hypokalemia - resolved with replacement   Incarcerated right sided inguinal hernia No surgical intervention recommended by surgery team. Continue as needed pain medication   Essential hypertension, --cont amlodipine  and losartan    Hyperlipidemia -does not appear to be on statin   No change in treatment plan in the past week.  Pending placement.    Subjective:  Pt was  seen feeding himself.  No new complaint today.   Physical Exam: Vitals:   03/15/24 2053 03/16/24 0410 03/16/24 0758 03/16/24 1456  BP: 119/60 126/76 131/80 (!) 122/51  Pulse: (!) 46 64 (!) 54 (!) 51  Resp: 16 16 16 16   Temp: 98.3 F (36.8 C) 97.7 F (36.5 C) 98.1 F (36.7 C) (!) 97.1 F (36.2 C)  TempSrc: Oral Oral    SpO2: 94% 98% 100% 97%  Weight:      Height:        Constitutional: NAD, alert, not oriented HEENT: conjunctivae and lids normal, EOMI CV: No cyanosis.   RESP: normal respiratory effort, on RA Neuro: II - XII grossly intact.     Data Reviewed:  There are no new results to review at this time.  Family Communication: None  Disposition: Status is: Inpatient Unsafe discharge.     Time spent: 25 minutes  Author: Garrison Kanner, MD 03/16/2024 6:57 PM  For on call review [email or web address].

## 2024-03-16 NOTE — Plan of Care (Signed)
  Problem: Clinical Measurements: Goal: Ability to maintain clinical measurements within normal limits will improve Outcome: Progressing Goal: Will remain free from infection Outcome: Progressing Goal: Diagnostic test results will improve Outcome: Progressing Goal: Respiratory complications will improve Outcome: Progressing Goal: Cardiovascular complication will be avoided Outcome: Progressing   Problem: Activity: Goal: Risk for activity intolerance will decrease Outcome: Progressing   Problem: Nutrition: Goal: Adequate nutrition will be maintained Outcome: Progressing   Problem: Coping: Goal: Level of anxiety will decrease Outcome: Progressing   Problem: Elimination: Goal: Will not experience complications related to bowel motility Outcome: Progressing Goal: Will not experience complications related to urinary retention Outcome: Progressing   Problem: Pain Managment: Goal: General experience of comfort will improve and/or be controlled Outcome: Progressing   Problem: Safety: Goal: Ability to remain free from injury will improve Outcome: Progressing   Problem: Skin Integrity: Goal: Risk for impaired skin integrity will decrease Outcome: Progressing   Problem: Safety: Goal: Non-violent Restraint(s) Outcome: Progressing

## 2024-03-16 NOTE — Progress Notes (Signed)
 Mobility Specialist - Progress Note   03/16/24 1540  Mobility  Activity Ambulated with assistance in hallway  Level of Assistance Contact guard assist, steadying assist  Assistive Device None (chair follow)  Distance Ambulated (ft) 160 ft  Activity Response Tolerated well  Mobility visit 1 Mobility  Mobility Specialist Start Time (ACUTE ONLY) 1145  Mobility Specialist Stop Time (ACUTE ONLY) 1202  Mobility Specialist Time Calculation (min) (ACUTE ONLY) 17 min   Pt sitting in the recliner upon entry, utilizing RA. Pt agreeable to amb/activity this date. Pt STS from recliner MinA of one-- BM noted. Pt amb one lap around the NS with HHA of the RUE with chair follow (not utilized), tolerated well. Pt would intermittently reach for the railing, only to repeatedly tap and quickly remove hand. Pt returned to the room, stood at the sink while MS completed peri care MaxA. Pt left seated in the recliner with alarm set and needs within reach.  Versa Gore Mobility Specialist 03/16/24 4:16 PM

## 2024-03-17 DIAGNOSIS — R001 Bradycardia, unspecified: Secondary | ICD-10-CM | POA: Diagnosis not present

## 2024-03-17 LAB — BASIC METABOLIC PANEL WITH GFR
Anion gap: 6 (ref 5–15)
BUN: 27 mg/dL — ABNORMAL HIGH (ref 8–23)
CO2: 26 mmol/L (ref 22–32)
Calcium: 9 mg/dL (ref 8.9–10.3)
Chloride: 106 mmol/L (ref 98–111)
Creatinine, Ser: 0.87 mg/dL (ref 0.61–1.24)
GFR, Estimated: >60 mL/min (ref >60)
Glucose, Bld: 113 mg/dL — ABNORMAL HIGH (ref 70–99)
Potassium: 3.9 mmol/L (ref 3.5–5.1)
Sodium: 138 mmol/L (ref 135–145)

## 2024-03-17 NOTE — Progress Notes (Signed)
 Physical Therapy Treatment Patient Details Name: Seth Thompson MRN: [id number] DOB: August 24, 1946 Today's Date: 03/17/2024   History of Present Illness Pt is a 78 year old male admitted after LOC in shower, admitted with acute metabolic encephalopathy, syncope in the setting of bradycardia, hypokalemia, incarcerated right sided inguinal hernia. PMH significant for essential hypertension, dementia, frequent PVCs, and hyperlipidemia.    PT Comments  Pt was sleeping upon arrival and easy to wake, pt agreeable and very pleasant throughout treatment. Pt presents with increased need of tactile and vc's during bed mobility today compared to previous sessions, requiring support at the trunk due to increased posterior lean. During STS, pt was able to perform the activity with extra time and increased vc's to initiate movement. Pt ambulated 10 ft with 1+ leading hand held assist (shuffling gait noted with minimal forward progression and occasional grabbing at rails in hallway, for increased stimulation). Due to decreased gait speed and increased shuffling, PT prompted secondary hand hold assist (2+) for the remaining 120 ft (increased stride length and gait speed noted). Pt continues to require w/c follow throughout all bouts of ambulation for increased safety. Due to increased postural lean and increased knee flexion secondary to fatigue, ambulation bout terminated and pt transported into new room. Pt will benefit from skilled therapeutic interventions to return to PLOF.     If plan is discharge home, recommend the following: Two people to help with walking and/or transfers;A lot of help with bathing/dressing/bathroom;Direct supervision/assist for medications management;Supervision due to cognitive status;Assist for transportation   Can travel by private vehicle     No  Equipment Recommendations  None recommended by PT    Recommendations for Other Services       Precautions / Restrictions  Precautions Precautions: Fall Recall of Precautions/Restrictions: Intact Restrictions Weight Bearing Restrictions Per Provider Order: No     Mobility  Bed Mobility Overal bed mobility: Needs Assistance Bed Mobility: Supine to Sit     Supine to sit: Mod assist, +2 for safety/equipment (2nd assist due to impaired motor planning and cognitive deficits.)     General bed mobility comments: Required additional assistance to square hips, to be able to sit EOB.    Transfers Overall transfer level: Needs assistance Equipment used: Rolling walker (2 wheels), 1 person hand held assist, 2 person hand held assist Transfers: Sit to/from Stand Sit to Stand: Min assist           General transfer comment: STS continue to require hand hold assist to initiate movement and vc's for UP.    Ambulation/Gait Ambulation/Gait assistance: +2 physical assistance, Min assist, Contact guard assist (w/c follow) Gait Distance (Feet): 130 Feet Assistive device: 2 person hand held assist Gait Pattern/deviations: Step-through pattern, Decreased step length - right, Decreased step length - left, Step-to pattern, Knee flexed in stance - right, Knee flexed in stance - left, Shuffle, Leaning posteriorly Gait velocity: decreased     General Gait Details: PT provided a multitude of vc's for bigger steps, pt did not show improvements in stride length today. Pt ambulated 10 ft with leading hand held assist, shuffling gait noted with minimal forward progression and occasional grabbing at rails in hallway, for increased stimulation. Pt required 2+hand held assist for the remainder of ambulation bout. Pt required w/c follow throughout all bouts of ambulation.        Balance Overall balance assessment: Needs assistance Sitting-balance support: Feet supported, No upper extremity supported Sitting balance-Leahy Scale: Fair Sitting balance - Comments: unable to  mainatin steady static sitting today, due to poor  postural control Postural control: Posterior lean Standing balance support: Bilateral upper extremity supported Standing balance-Leahy Scale: Poor Standing balance comment: During ambulation bout pt had increased postural impairments with increased posterior lean. As fatigue increased, bilateral knee flexion was noted, prompting termination of session.                            Communication Communication Communication: Impaired Factors Affecting Communication: Difficulty expressing self  Cognition Arousal: Alert Behavior During Therapy: WFL for tasks assessed/performed   PT - Cognitive impairments: No family/caregiver present to determine baseline                       PT - Cognition Comments: Pt was asked to recall name after 2 trials, was able to follow commands for whistling tune. Following commands: Impaired Following commands impaired: Follows one step commands inconsistently, Follows one step commands with increased time    Cueing Cueing Techniques: Verbal cues, Gestural cues, Tactile cues, Visual cues     General Comments General comments (skin integrity, edema, etc.): Pre session: HR: 55 bpm, SpO2: 95+% on room air.      Pertinent Vitals/Pain Pain Assessment Pain Assessment: PAINAD Breathing: normal Negative Vocalization: occasional moan/groan, low speech, negative/disapproving quality Facial Expression: smiling or inexpressive Body Language: relaxed Consolability: no need to console PAINAD Score: 1 Pain Intervention(s): Monitored during session    Home Living Family/patient expects to be discharged to:: Other (Comment)                   Additional Comments: Pt from [facility]        PT Goals (current goals can now be found in the care plan section) Acute Rehab PT Goals PT Goal Formulation: Patient unable to participate in goal setting Time For Goal Achievement: 03/12/24 Potential to Achieve Goals: Fair Progress towards PT  goals: Progressing toward goals    Frequency    Min 2X/week       AM-PAC PT 6 Clicks Mobility   Outcome Measure  Help needed turning from your back to your side while in a flat bed without using bedrails?: A Little Help needed moving from lying on your back to sitting on the side of a flat bed without using bedrails?: A Lot Help needed moving to and from a bed to a chair (including a wheelchair)?: A Lot Help needed standing up from a chair using your arms (e.g., wheelchair or bedside chair)?: A Lot Help needed to walk in hospital room?: Total Help needed climbing 3-5 steps with a railing? : Total 6 Click Score: 11    End of Session Equipment Utilized During Treatment: Gait belt Activity Tolerance: Patient tolerated treatment well Patient left: with call bell/phone within reach;Other (comment);in chair;with chair alarm set (Fall mat placed in front of pt) Nurse Communication: Mobility status PT Visit Diagnosis: Unsteadiness on feet (R26.81);Difficulty in walking, not elsewhere classified (R26.2);Muscle weakness (generalized) (M62.81)     Time: 9147-8295 PT Time Calculation (min) (ACUTE ONLY): 25 min  Charges:                 Madisen Ludvigsen, SPT 03/17/24, 11:59 AM

## 2024-03-17 NOTE — TOC Progression Note (Signed)
 Transition of Care [facility]) - Progression Note    Patient Details  Name: Seth Thompson MRN: [id number] Date of Birth: 31-Aug-1946  Transition of Care [facility]) CM/SW Contact  Loman Risk, RN Phone Number: 03/17/2024, 11:17 AM  Clinical Narrative:      Message sent to Fredrik Jensen with DSS and Abran Abrahams at [facility] to determine if there is any update on the financials from Guardian of the [place]    Expected Discharge Plan: Skilled Nursing Facility Barriers to Discharge: Continued Medical Work up  Expected Discharge Plan and Services       Living arrangements for the past 2 months: Assisted Living Facility                                       Social Determinants of Health (SDOH) Interventions SDOH Screenings   Food Insecurity: No Food Insecurity (02/11/2024)  Housing: Low Risk  (02/11/2024)  Transportation Needs: No Transportation Needs (02/11/2024)  Utilities: Not At Risk (02/11/2024)  Social Connections: Socially Isolated (02/11/2024)  Tobacco Use: Low Risk  (02/12/2024)    Readmission Risk Interventions     No data to display

## 2024-03-17 NOTE — Plan of Care (Signed)
  Problem: Clinical Measurements: Goal: Ability to maintain clinical measurements within normal limits will improve Outcome: Progressing Goal: Will remain free from infection Outcome: Progressing Goal: Diagnostic test results will improve Outcome: Progressing Goal: Respiratory complications will improve Outcome: Progressing Goal: Cardiovascular complication will be avoided Outcome: Progressing   Problem: Activity: Goal: Risk for activity intolerance will decrease Outcome: Progressing   Problem: Nutrition: Goal: Adequate nutrition will be maintained Outcome: Progressing   Problem: Coping: Goal: Level of anxiety will decrease Outcome: Progressing   Problem: Elimination: Goal: Will not experience complications related to bowel motility Outcome: Progressing Goal: Will not experience complications related to urinary retention Outcome: Progressing   Problem: Pain Managment: Goal: General experience of comfort will improve and/or be controlled Outcome: Progressing   Problem: Safety: Goal: Ability to remain free from injury will improve Outcome: Progressing   Problem: Skin Integrity: Goal: Risk for impaired skin integrity will decrease Outcome: Progressing   Problem: Safety: Goal: Non-violent Restraint(s) Outcome: Progressing

## 2024-03-17 NOTE — Progress Notes (Addendum)
 PROGRESS NOTE    Seth Thompson   GNF:[id number] DOB: October 10, 1945  DOA: 02/11/2024 Date of Service: 03/17/24 which is hospital day 35  PCP: [facility] course / significant events:  Seth Thompson is a 78 y.o. male with medical history significant of essential hypertension, dementia, frequent PVCs, hyperlipidemia who was brought in from a facility after the patient lost consciousness while sitting on the shower chair at the facility.  He is admitted for further management and evaluation of altered mental status. Tx pneumonia, also severe bradycardia likely cause for syncope, but not good candidate for pacer. Upon arrival to the emergency room patient was found to be bradycardic in the 50s and had a drop of his pulse into the 20s.Echo showed EF 60 to 65% with normal diastolic parameters.Telemetry unremarkable. Currently financials are holding up dc to facility, pt is not safe to live alone      Consultants:  Cardiology General surgery  Palliative care  Procedures/Surgeries: none      ASSESSMENT & PLAN:   Acute metabolic encephalopathy on chronic cognitive impairment - now at baseline Severe dementia with behavioral disturbances Sun-downing seroquel  50 mg nightly Haldol  IM PRN  Syncope in the setting of bradycardia-improved Avoid AV nodal blocking agents. Cardiologist advised no intervention of permanent pacemaker at this time. Community-acquired pneumonia - resolved received Augmentin  for total 5 days. Recheck as needed  Hypokalemia Replace as needed Monitor BMP   Incarcerated right sided inguinal hernia No surgical intervention recommended by surgery team. Continue as needed pain medication   Essential hypertension, cont amlodipine  and losartan    Hyperlipidemia No statin    overweight based on BMI: Body mass index is 25.02 kg/m.[name] Significantly low or high BMI is associated with higher medical risk.  Underweight - under 18   overweight - 25 to 29 obese - 30 or more Class 1 obesity: BMI of 30.0 to 34 Class 2 obesity: BMI of 35.0 to 39 Class 3 obesity: BMI of 40.0 to 49 Super Morbid Obesity: BMI 50-59 Super-super Morbid Obesity: BMI 60+ Healthy nutrition and physical activity advised as adjunct to other disease management and risk reduction treatments    DVT prophylaxis: lovenox  IV fluids: no continuous IV fluids  Nutrition: regular Central lines / other devices: none  Code Status: DNR ACP documentation reviewed: has DNR order on file in [facility]  [facility] needs: placement Medical barriers to dispo: none.             Subjective / Brief ROS:  Patient reports no concerns Denies CP/SOB.  Pain controlled.    Family Communication: none at this time     Objective Findings:  Vitals:   03/16/24 1950 03/17/24 0404 03/17/24 0700 03/17/24 1443  BP: (!) 140/98 108/60 119/71 (!) 107/50  Pulse: (!) 53  (!) 50 84  Resp: 20 20 18 19   Temp: 98 F (36.7 C) 97.7 F (36.5 C) 98 F (36.7 C) (!) 97.5 F (36.4 C)  TempSrc:  Oral Axillary Oral  SpO2: 100% 91% 96% 91%  Weight:      Height:        Intake/Output Summary (Last 24 hours) at 03/17/2024 1612 Last data filed at 03/17/2024 1038 Gross per 24 hour  Intake 360 ml  Output 150 ml  Net 210 ml   Filed Weights   02/11/24 0633 02/17/24 0405 02/20/24 2100  Weight: 89.9 kg 78.3 kg 79.1 kg    Examination:  Physical Exam HENT:  Head: Normocephalic.  Cardiovascular:     Rate and Rhythm: Normal rate and regular rhythm.  Pulmonary:     Effort: Pulmonary effort is normal.     Breath sounds: Normal breath sounds.  Musculoskeletal:     Right lower leg: No edema.     Left lower leg: No edema.  Skin:    General: Skin is warm and dry.  Neurological:     Mental Status: He is alert. Mental status is at baseline. He is disoriented.          Scheduled Medications:   amLODipine   10 mg Oral Daily   enoxaparin  (LOVENOX ) injection  40 mg  Subcutaneous Q24H   feeding supplement  237 mL Oral BID BM   losartan   25 mg Oral Daily   melatonin  5 mg Oral QHS   QUEtiapine   50 mg Oral QHS   senna  1 tablet Oral Once per day on Monday Wednesday Friday   sertraline   50 mg Oral QHS    Continuous Infusions:   PRN Medications:  guaiFENesin -dextromethorphan, haloperidol  lactate, hydrALAZINE , lactulose , polyethylene glycol  Antimicrobials from admission:  Anti-infectives (From admission, onward)    Start     Dose/Rate Route Frequency Ordered Stop   02/16/24 1530  amoxicillin -clavulanate (AUGMENTIN ) 875-125 MG per tablet 1 tablet        1 tablet Oral Every 12 hours 02/16/24 1435 02/20/24 2043   02/16/24 1500  cephALEXin  (KEFLEX ) capsule 500 mg  Status:  Discontinued        500 mg Oral 2 times daily 02/16/24 1406 02/16/24 1435   02/16/24 1300  cefTRIAXone  (ROCEPHIN ) 2 g in sodium chloride  0.9 % 100 mL IVPB  Status:  Discontinued        2 g 200 mL/hr over 30 Minutes Intravenous Every 24 hours 02/16/24 1221 02/16/24 1406   02/16/24 1300  azithromycin  (ZITHROMAX ) 500 mg in sodium chloride  0.9 % 250 mL IVPB  Status:  Discontinued        500 mg 250 mL/hr over 60 Minutes Intravenous Every 24 hours 02/16/24 1221 02/16/24 1406           Data Reviewed:  I have personally reviewed the following...  CBC: No results for input(s): WBC, NEUTROABS, HGB, HCT, MCV, PLT in the last 168 hours. Basic Metabolic Panel: Recent Labs  Lab 03/17/24 0451  NA 138  K 3.9  CL 106  CO2 26  GLUCOSE 113*  BUN 27*  CREATININE 0.87  CALCIUM 9.0   GFR: Estimated Creatinine Clearance: 73.4 mL/min (by C-G formula based on SCr of 0.87 mg/dL). Liver Function Tests: No results for input(s): AST, ALT, ALKPHOS, BILITOT, PROT, ALBUMIN in the last 168 hours. No results for input(s): LIPASE, AMYLASE in the last 168 hours. No results for input(s): AMMONIA in the last 168 hours. Coagulation Profile: No results for input(s):  INR, PROTIME in the last 168 hours. Cardiac Enzymes: No results for input(s): CKTOTAL, CKMB, CKMBINDEX, TROPONINI in the last 168 hours. BNP (last 3 results) No results for input(s): PROBNP in the last 8760 hours. HbA1C: No results for input(s): HGBA1C in the last 72 hours. CBG: No results for input(s): GLUCAP in the last 168 hours. Lipid Profile: No results for input(s): CHOL, HDL, LDLCALC, TRIG, CHOLHDL, LDLDIRECT in the last 72 hours. Thyroid  Function Tests: No results for input(s): TSH, T4TOTAL, FREET4, T3FREE, THYROIDAB in the last 72 hours. Anemia Panel: No results for input(s): VITAMINB12, FOLATE, FERRITIN, TIBC, IRON, RETICCTPCT in the last 72 hours. Most  Recent Urinalysis On File:     Component Value Date/Time   COLORURINE YELLOW (A) 02/11/2024 0638   APPEARANCEUR CLEAR (A) 02/11/2024 0638   APPEARANCEUR Clear 05/06/2013 2002   LABSPEC 1.008 02/11/2024 0638   LABSPEC 1.017 05/06/2013 2002   PHURINE 6.0 02/11/2024 0638   GLUCOSEU NEGATIVE 02/11/2024 0638   GLUCOSEU Negative 05/06/2013 2002   HGBUR SMALL (A) 02/11/2024 0638   BILIRUBINUR NEGATIVE 02/11/2024 0638   BILIRUBINUR Negative 05/06/2013 2002   KETONESUR NEGATIVE 02/11/2024 0638   PROTEINUR NEGATIVE 02/11/2024 0638   NITRITE NEGATIVE 02/11/2024 0638   LEUKOCYTESUR NEGATIVE 02/11/2024 0638   LEUKOCYTESUR Negative 05/06/2013 2002   Sepsis Labs: @LABRCNTIP (procalcitonin:4,lacticidven:4) Microbiology: No results found for this or any previous visit (from the past 240 hours).    Radiology Studies last 3 days: No results found.        Lazaro Isenhower, DO [facility] 03/17/2024, 4:12 PM    Dictation software may have been used to generate the above note. Typos may occur and escape review in typed/dictated notes. Please contact Dr Authur Leghorn directly for clarity if needed.  Staff may message me via secure chat in Epic  but this may not  receive an immediate response,  please page me for urgent matters!  If 7PM-7AM, please contact night coverage [email or web address]

## 2024-03-17 NOTE — Progress Notes (Signed)
 Mobility Specialist - Progress Note   03/17/24 1523  Mobility  Activity Ambulated with assistance in hallway;Transferred to/from [facility];Ambulated with assistance in room  Level of Assistance Contact guard assist, steadying assist  Assistive Device  (HHA LUE)  Distance Ambulated (ft) 80 ft  Activity Response Tolerated well  Mobility visit 1 Mobility  Mobility Specialist Start Time (ACUTE ONLY) 1500  Mobility Specialist Stop Time (ACUTE ONLY) 1520  Mobility Specialist Time Calculation (min) (ACUTE ONLY) 20 min   Pt sitting in the recliner upon entry, utilizing RA. Pt agreeable to activity this date. Pt STS from recliner MinA, BM noted upon standing. Pt transferred to the [facility], doff/don gown. Pt stood for ~2 mins while MS completed peri care MaxA. Pt amb ~80 ft in the hallway with HHA of the LUE while utilizing the railing with the RUE--- chair follow, redirection to task. Pt prompted to sit in the recliner as he became increasing unable to redirect. Pt wheeled into the room, transferred to bed and left with alarm set and needs within reach.  Versa Gore Mobility Specialist 03/17/24 3:41 PM

## 2024-03-18 DIAGNOSIS — R001 Bradycardia, unspecified: Secondary | ICD-10-CM | POA: Diagnosis not present

## 2024-03-18 NOTE — Progress Notes (Signed)
 Physical Therapy Treatment Patient Details Name: Seth Thompson MRN: [id number] DOB: 03-20-1946 Today's Date: 03/18/2024   History of Present Illness Pt is a 78 year old male admitted after LOC in shower, admitted with acute metabolic encephalopathy, syncope in the setting of bradycardia, hypokalemia, incarcerated right sided inguinal hernia. PMH significant for essential hypertension, dementia, frequent PVCs, and hyperlipidemia. (Simultaneous filing. User may not have seen previous data.)    PT Comments  ALF rep here to access ability for pt to return home. Co-tx to allow for both disciplines to be available to answer questions/demo skills.   Pt is able to get to EOB with min/mod a x 1 and increased time.  Some physical assist needed to get to EOB and initiate movement.  He requires light min a x 1 for sitting balance.  Stood with mod a x 1  and inc BM is noted.  Care provided in standing.  He is able to walk 100' with HHA +1 with light assist to navigate and general balance deficits.  Returns to room where he does eventually drop my hand for support.  Self initiates sitting in recliner vs back to bed and safety on.  ALF rep stated pt was ind amb in facility prior to admission and needs to be able to walk to bathroom with +1 assist from staff.  Pt did demo some increased assist needs from baseline but does only need +1 for assist at this time and would be recommended upon discharge for safety as balance deficits are evident.  She reported cognition is at baseline.  Discussed with team.  Return to current ALF is recommended but if facility does not take pt back at this time, SNF would be needed to help pt return to baseline.   If plan is discharge home, recommend the following: A lot of help with walking and/or transfers;A lot of help with bathing/dressing/bathroom;Assistance with cooking/housework;Assistance with feeding;Help with stairs or ramp for entrance   Can travel by private vehicle         Equipment Recommendations  None recommended by PT    Recommendations for Other Services       Precautions / Restrictions Precautions Precautions: Fall Recall of Precautions/Restrictions: Intact Restrictions Weight Bearing Restrictions Per Provider Order: No     Mobility  Bed Mobility Overal bed mobility: Needs Assistance Bed Mobility: Supine to Sit Rolling: Mod assist         General bed mobility comments: tactile cues and assist to EOB Patient Response: Cooperative  Transfers Overall transfer level: Needs assistance Equipment used: 1 person hand held assist Transfers: Sit to/from Stand Sit to Stand: Min assist, Mod assist                Ambulation/Gait Ambulation/Gait assistance: Min assist Gait Distance (Feet): 100 Feet Assistive device: 1 person hand held assist Gait Pattern/deviations: Step-through pattern, Decreased step length - right, Decreased step length - left Gait velocity: decreased     General Gait Details: short shuffling steps.  initially a bit hesitant but progressed with time and did drop my hand upon return to room and cga provided.   Stairs             Wheelchair Mobility     Tilt Bed Tilt Bed Patient Response: Cooperative  Modified Rankin (Stroke Patients Only)       Balance Overall balance assessment: Needs assistance Sitting-balance support: Feet supported, No upper extremity supported Sitting balance-Leahy Scale: Fair     Standing balance support:  Single extremity supported Standing balance-Leahy Scale: Poor Standing balance comment: +1 with gait for safety                            Communication Communication Communication: Impaired Factors Affecting Communication: Difficulty expressing self  Cognition Arousal: Alert Behavior During Therapy: WFL for tasks assessed/performed   PT - Cognitive impairments: History of cognitive impairments                       PT - Cognition  Comments: saff from ALF in to acess and stated he is at baseline for cognition Following commands: Impaired Following commands impaired: Follows one step commands inconsistently, Follows one step commands with increased time    Cueing Cueing Techniques: Verbal cues, Gestural cues, Tactile cues, Visual cues  Exercises Other Exercises Other Exercises: standing for BM care due to inc in bed.    General Comments        Pertinent Vitals/Pain Pain Assessment Pain Assessment: No/denies pain    Home Living                          Prior Function            PT Goals (current goals can now be found in the care plan section) Progress towards PT goals: Progressing toward goals    Frequency    Min 2X/week      PT Plan      Co-evaluation PT/OT/SLP Co-Evaluation/Treatment: Yes Reason for Co-Treatment: Other (comment) (to answer ALF questions and demo mobility  Simultaneous filing. User may not have seen previous data.) PT goals addressed during session: Mobility/safety with mobility;Balance;Strengthening/ROM OT goals addressed during session: ADL's and self-care      AM-PAC PT 6 Clicks Mobility   Outcome Measure  Help needed turning from your back to your side while in a flat bed without using bedrails?: A Little Help needed moving from lying on your back to sitting on the side of a flat bed without using bedrails?: A Lot Help needed moving to and from a bed to a chair (including a wheelchair)?: A Little Help needed standing up from a chair using your arms (e.g., wheelchair or bedside chair)?: A Little Help needed to walk in hospital room?: A Little Help needed climbing 3-5 steps with a railing? : Total 6 Click Score: 15    End of Session Equipment Utilized During Treatment: Gait belt Activity Tolerance: Patient tolerated treatment well Patient left: in chair;with chair alarm set;with call bell/phone within reach Nurse Communication: Mobility status PT Visit  Diagnosis: Unsteadiness on feet (R26.81);Difficulty in walking, not elsewhere classified (R26.2);Muscle weakness (generalized) (M62.81)     Time: 1010-1031 PT Time Calculation (min) (ACUTE ONLY): 21 min  Charges:    $Gait Training: 8-22 mins PT General Charges $$ ACUTE PT VISIT: 1 Visit                   Charlanne Cong, PTA 03/18/24, 10:45 AM

## 2024-03-18 NOTE — Progress Notes (Signed)
 Occupational Therapy Treatment Patient Details Name: Seth Thompson MRN: [id number] DOB: [birth year] Today's Date: 03/18/2024   History of present illness Pt is a 78 year old male admitted after LOC in shower, admitted with acute metabolic encephalopathy, syncope in the setting of bradycardia, hypokalemia, incarcerated right sided inguinal hernia. PMH significant for essential hypertension, dementia, frequent PVCs, and hyperlipidemia.   OT comments  Pt seen with PT for co-tx to maximize functional outcomes and to answer questions regarding CLOF from staff member at [facility]. Pt appears to be at his cognitive baseline, pleasant and cooperative throughout. Pt requires MIN - MOD A for bed mobility, +1 HHA with max multimodal cuing for task initiation and motor planning. Found to be incontinent of BM upon standing attempt, pericare performed with MAX A. Pt completes functional mobility 100 ft in hallway with PTA +1 HHA, left seated in recliner with needs in reach. Secure chat sent to care team to update. OT will continue to progress as able. 24/7 assist is recommended at discharge for baseline cognitive deficits, +1 needed for mobility efforts.       If plan is discharge home, recommend the following:  Supervision due to cognitive status;A lot of help with bathing/dressing/bathroom;Direct supervision/assist for medications management;Direct supervision/assist for financial management;Assist for transportation;Assistance with cooking/housework;Help with stairs or ramp for entrance;Assistance with feeding;A lot of help with walking and/or transfers   Equipment Recommendations  Wheelchair (measurements OT);Wheelchair cushion (measurements OT)       Precautions / Restrictions Precautions Precautions: Fall Recall of Precautions/Restrictions: Intact Restrictions Weight Bearing Restrictions Per Provider Order: No       Mobility Bed Mobility Overal bed mobility: Needs Assistance Bed  Mobility: Supine to Sit     Supine to sit: Min assist, Mod assist     General bed mobility comments: tactile cues and assist to EOB    Transfers Overall transfer level: Needs assistance Equipment used: 1 person hand held assist Transfers: Sit to/from Stand Sit to Stand: Min assist, Mod assist           General transfer comment: requires HHA to initiate movement and motor plan     Balance Overall balance assessment: Needs assistance Sitting-balance support: Feet supported, No upper extremity supported Sitting balance-Leahy Scale: Fair     Standing balance support: Single extremity supported Standing balance-Leahy Scale: Poor Standing balance comment: +1 with gait for safety                           ADL either performed or assessed with clinical judgement   ADL Overall ADL's : Needs assistance/impaired                             Toileting- Clothing Manipulation and Hygiene: Sit to/from stand;Maximal assistance Toileting - Clothing Manipulation Details (indicate cue type and reason): upon standing, pt noted to have incontinent BM (staff from [employer] reports he is incontinent at baseline). MAX A for pericare while standing.     Functional mobility during ADLs: Cueing for safety;Cueing for sequencing General ADL Comments: Session focused on functional mobility, pt requires +1 HHA with step by step multimodal cues due to baseline dementia     Communication Communication Communication: Impaired Factors Affecting Communication: Difficulty expressing self   Cognition Arousal: Alert Behavior During Therapy: WFL for tasks assessed/performed Cognition: History of cognitive impairments   Orientation impairments: Place, Time, Situation Awareness: Intellectual awareness impaired, Online  awareness impaired   Attention impairment (select first level of impairment): Focused attention Executive functioning impairment (select all impairments):  Initiation, Organization, Sequencing, Reasoning, Problem solving OT - Cognition Comments: pleasant, easily distracted, benefits from simple repeated multimodal cuing                 Following commands: Impaired Following commands impaired: Follows one step commands inconsistently, Follows one step commands with increased time      Cueing   Cueing Techniques: Verbal cues, Gestural cues, Tactile cues, Visual cues        General Comments  Staff member from [employer] present to observe session to determine CLOF for return to ALF. Staff member states pt needs to be able to walk t/f bathroom with +1 assist, facility provides assist for ADLs and pt is at cognitive baseline.     Pertinent Vitals/ Pain       Pain Assessment Pain Assessment: No/denies pain   Frequency  Min 1X/week        Progress Toward Goals  OT Goals(current goals can now be found in the care plan section)  Progress towards OT goals: Progressing toward goals  Acute Rehab OT Goals OT Goal Formulation: Patient unable to participate in goal setting Time For Goal Achievement: 03/26/24 Potential to Achieve Goals: Fair  Plan      Co-evaluation    PT/OT/SLP Co-Evaluation/Treatment: Yes Reason for Co-Treatment: Other (comment) (to answer ALF questions and demo mobility  Simultaneous filing. User may not have seen previous data.) PT goals addressed during session: Mobility/safety with mobility;Balance;Strengthening/ROM OT goals addressed during session: ADL's and self-care      AM-PAC OT 6 Clicks Daily Activity     Outcome Measure   Help from another person eating meals?: A Little Help from another person taking care of personal grooming?: A Lot Help from another person toileting, which includes using toliet, bedpan, or urinal?: A Lot Help from another person bathing (including washing, rinsing, drying)?: A Lot Help from another person to put on and taking off regular upper body clothing?: A  Lot Help from another person to put on and taking off regular lower body clothing?: A Lot 6 Click Score: 13    End of Session Equipment Utilized During Treatment: Gait belt  OT Visit Diagnosis: Other abnormalities of gait and mobility (R26.89);Cognitive communication deficit (R41.841);Other symptoms and signs involving cognitive function Symptoms and signs involving cognitive functions: Other cerebrovascular disease   Activity Tolerance Patient tolerated treatment well   Patient Left in chair;with call bell/phone within reach;with chair alarm set   Nurse Communication Mobility status        Time: 1005-1031 OT Time Calculation (min): 26 min  Charges: OT General Charges $OT Visit: 1 Visit OT Treatments $Self Care/Home Management : 8-22 mins  Eryck Negron L. Lenia Housley, OTR/L  03/18/24, 10:45 AM

## 2024-03-18 NOTE — Plan of Care (Signed)
  Problem: Clinical Measurements: Goal: Ability to maintain clinical measurements within normal limits will improve Outcome: Progressing   Problem: Activity: Goal: Risk for activity intolerance will decrease Outcome: Progressing   Problem: Coping: Goal: Level of anxiety will decrease Outcome: Progressing   Problem: Pain Managment: Goal: General experience of comfort will improve and/or be controlled Outcome: Progressing   Problem: Skin Integrity: Goal: Risk for impaired skin integrity will decrease Outcome: Progressing   Problem: Safety: Goal: Non-violent Restraint(s) Outcome: Progressing

## 2024-03-18 NOTE — Progress Notes (Signed)
 Mobility Specialist - Progress Note   03/18/24 1622  Mobility  Activity Ambulated with assistance in hallway  Level of Assistance Minimal assist, patient does 75% or more  Assistive Device Front wheel walker  Distance Ambulated (ft) 160 ft  Activity Response Tolerated well  Mobility visit 1 Mobility  Mobility Specialist Start Time (ACUTE ONLY) 1533  Mobility Specialist Stop Time (ACUTE ONLY) 1555  Mobility Specialist Time Calculation (min) (ACUTE ONLY) 22 min   Pt supine upon entry, utilizing RA. Pt agreeable to OOB amb this date. Pt completed bed mob ModA to bring BLE EOB, MinA to bring trunk from sup to sit-- Pt able to scoot twice toward the EOB, able to self support trunk once seated EOB. Pt STS to RW ModA, stood EOB while MS completed peri care MaxA. Pt amb one lap around the NS, requiring hand over hand assist and consistent manual movement of the RW. Pt returned to the room, attempted to sit on the commode however unable to effectively que Pt to sit prompting a return to bed. Pt left supine with alarm set and needs within reach.  Versa Gore Mobility Specialist 03/18/24 4:30 PM

## 2024-03-18 NOTE — Progress Notes (Signed)
 PROGRESS NOTE    Seth Thompson   NGE:[id number] DOB: 1946/07/22  DOA: 02/11/2024 Date of Service: 03/18/24 which is hospital day 36  PCP: [facility] course / significant events:  Seth Thompson is a 78 y.o. male with medical history significant of essential hypertension, dementia, frequent PVCs, hyperlipidemia who was brought in from a facility after the patient lost consciousness while sitting on the shower chair at the facility.  He is admitted for further management and evaluation of altered mental status. Tx pneumonia, also severe bradycardia likely cause for syncope, but not good candidate for pacer. Upon arrival to the emergency room patient was found to be bradycardic in the 50s and had a drop of his pulse into the 20s.Echo showed EF 60 to 65% with normal diastolic parameters.Telemetry unremarkable. Currently financials are holding up dc to facility, pt is not safe to live alone      Consultants:  Cardiology General surgery  Palliative care  Procedures/Surgeries: none      ASSESSMENT & PLAN:   Acute metabolic encephalopathy on chronic cognitive impairment - now at baseline Severe dementia with behavioral disturbances Sun-downing seroquel  50 mg nightly Haldol  IM PRN  Syncope in the setting of bradycardia-improved Avoid AV nodal blocking agents. Cardiologist advised no intervention of permanent pacemaker at this time. Community-acquired pneumonia - resolved received Augmentin  for total 5 days. Recheck as needed  Hypokalemia Replace as needed Monitor BMP   Incarcerated right sided inguinal hernia No surgical intervention recommended by surgery team. Continue as needed pain medication   Essential hypertension, cont amlodipine  and losartan    Hyperlipidemia No statin    overweight based on BMI: Body mass index is 25.02 kg/m.[name] Significantly low or high BMI is associated with higher medical risk.  Underweight - under 18   overweight - 25 to 29 obese - 30 or more Class 1 obesity: BMI of 30.0 to 34 Class 2 obesity: BMI of 35.0 to 39 Class 3 obesity: BMI of 40.0 to 49 Super Morbid Obesity: BMI 50-59 Super-super Morbid Obesity: BMI 60+ Healthy nutrition and physical activity advised as adjunct to other disease management and risk reduction treatments    DVT prophylaxis: lovenox  IV fluids: no continuous IV fluids  Nutrition: regular Central lines / other devices: none  Code Status: DNR ACP documentation reviewed: has DNR order on file in [facility]  [facility] needs: placement Medical barriers to dispo: none.             Subjective / Brief ROS:  Patient reports no concerns Denies CP/SOB.  Pain controlled.    Family Communication: none at this time     Objective Findings:  Vitals:   03/17/24 0700 03/17/24 1443 03/18/24 0353 03/18/24 0752  BP: 119/71 (!) 107/50 136/70 (!) 156/68  Pulse: (!) 50 84 (!) 51 (!) 41  Resp: 18 19 16 16   Temp: 98 F (36.7 C) (!) 97.5 F (36.4 C) (!) 97.5 F (36.4 C) 98 F (36.7 C)  TempSrc: Axillary Oral Oral   SpO2: 96% 91%    Weight:      Height:        Intake/Output Summary (Last 24 hours) at 03/18/2024 1500 Last data filed at 03/18/2024 0930 Gross per 24 hour  Intake 360 ml  Output 1000 ml  Net -640 ml   Filed Weights   02/11/24 0633 02/17/24 0405 02/20/24 2100  Weight: 89.9 kg 78.3 kg 79.1 kg    Examination:  Physical Exam  Cardiovascular:  Rate and Rhythm: Normal rate and regular rhythm.  Pulmonary:     Effort: Pulmonary effort is normal.     Breath sounds: Normal breath sounds.   Neurological:     Mental Status: He is alert. Mental status is at baseline.          Scheduled Medications:   amLODipine   10 mg Oral Daily   enoxaparin  (LOVENOX ) injection  40 mg Subcutaneous Q24H   feeding supplement  237 mL Oral BID BM   losartan   25 mg Oral Daily   melatonin  5 mg Oral QHS   QUEtiapine   50 mg Oral QHS   senna  1 tablet Oral  Once per day on Monday Wednesday Friday   sertraline   50 mg Oral QHS    Continuous Infusions:   PRN Medications:  guaiFENesin -dextromethorphan, haloperidol  lactate, hydrALAZINE , lactulose , polyethylene glycol  Antimicrobials from admission:  Anti-infectives (From admission, onward)    Start     Dose/Rate Route Frequency Ordered Stop   02/16/24 1530  amoxicillin -clavulanate (AUGMENTIN ) 875-125 MG per tablet 1 tablet        1 tablet Oral Every 12 hours 02/16/24 1435 02/20/24 2043   02/16/24 1500  cephALEXin  (KEFLEX ) capsule 500 mg  Status:  Discontinued        500 mg Oral 2 times daily 02/16/24 1406 02/16/24 1435   02/16/24 1300  cefTRIAXone  (ROCEPHIN ) 2 g in sodium chloride  0.9 % 100 mL IVPB  Status:  Discontinued        2 g 200 mL/hr over 30 Minutes Intravenous Every 24 hours 02/16/24 1221 02/16/24 1406   02/16/24 1300  azithromycin  (ZITHROMAX ) 500 mg in sodium chloride  0.9 % 250 mL IVPB  Status:  Discontinued        500 mg 250 mL/hr over 60 Minutes Intravenous Every 24 hours 02/16/24 1221 02/16/24 1406           Data Reviewed:  I have personally reviewed the following...  CBC: No results for input(s): WBC, NEUTROABS, HGB, HCT, MCV, PLT in the last 168 hours. Basic Metabolic Panel: Recent Labs  Lab 03/17/24 0451  NA 138  K 3.9  CL 106  CO2 26  GLUCOSE 113*  BUN 27*  CREATININE 0.87  CALCIUM 9.0   GFR: Estimated Creatinine Clearance: 73.4 mL/min (by C-G formula based on SCr of 0.87 mg/dL). Liver Function Tests: No results for input(s): AST, ALT, ALKPHOS, BILITOT, PROT, ALBUMIN in the last 168 hours. No results for input(s): LIPASE, AMYLASE in the last 168 hours. No results for input(s): AMMONIA in the last 168 hours. Coagulation Profile: No results for input(s): INR, PROTIME in the last 168 hours. Cardiac Enzymes: No results for input(s): CKTOTAL, CKMB, CKMBINDEX, TROPONINI in the last 168 hours. BNP (last 3  results) No results for input(s): PROBNP in the last 8760 hours. HbA1C: No results for input(s): HGBA1C in the last 72 hours. CBG: No results for input(s): GLUCAP in the last 168 hours. Lipid Profile: No results for input(s): CHOL, HDL, LDLCALC, TRIG, CHOLHDL, LDLDIRECT in the last 72 hours. Thyroid  Function Tests: No results for input(s): TSH, T4TOTAL, FREET4, T3FREE, THYROIDAB in the last 72 hours. Anemia Panel: No results for input(s): VITAMINB12, FOLATE, FERRITIN, TIBC, IRON, RETICCTPCT in the last 72 hours. Most Recent Urinalysis On File:     Component Value Date/Time   COLORURINE YELLOW (A) 02/11/2024 0638   APPEARANCEUR CLEAR (A) 02/11/2024 0638   APPEARANCEUR Clear 05/06/2013 2002   LABSPEC 1.008 02/11/2024 0638   LABSPEC 1.017  05/06/2013 2002   PHURINE 6.0 02/11/2024 0638   GLUCOSEU NEGATIVE 02/11/2024 0638   GLUCOSEU Negative 05/06/2013 2002   HGBUR SMALL (A) 02/11/2024 0638   BILIRUBINUR NEGATIVE 02/11/2024 0638   BILIRUBINUR Negative 05/06/2013 2002   KETONESUR NEGATIVE 02/11/2024 0638   PROTEINUR NEGATIVE 02/11/2024 0638   NITRITE NEGATIVE 02/11/2024 0638   LEUKOCYTESUR NEGATIVE 02/11/2024 0638   LEUKOCYTESUR Negative 05/06/2013 2002   Sepsis Labs: @LABRCNTIP (procalcitonin:4,lacticidven:4) Microbiology: No results found for this or any previous visit (from the past 240 hours).    Radiology Studies last 3 days: No results found.        Aashritha Miedema, DO [facility] 03/18/2024, 3:00 PM    Dictation software may have been used to generate the above note. Typos may occur and escape review in typed/dictated notes. Please contact Dr Authur Leghorn directly for clarity if needed.  Staff may message me via secure chat in Epic  but this may not receive an immediate response,  please page me for urgent matters!  If 7PM-7AM, please contact night coverage [email or web address]

## 2024-03-18 NOTE — TOC Progression Note (Signed)
 Transition of Care [facility]) - Progression Note    Patient Details  Name: Seth Thompson MRN: [id number] Date of Birth: 1946/02/20  [facility] of [facility]) CM/SW Contact  Loman Risk, RN Phone Number: 03/18/2024, 9:01 AM  Clinical Narrative:     Seth Thompson with [facility] coming at 10 am to reassess patient to determine if he is eligible to return   Seth Thompson with DSS and Seth Thompson at [facility] still working to [employer] from [place] the [place]   Expected Discharge Plan: Skilled Nursing Facility Barriers to Discharge: Continued Medical Work up  [facility]       Living arrangements for the past 2 months: Assisted Living Facility                                       Social Determinants of Health (SDOH) Interventions SDOH Screenings   Food Insecurity: No Food Insecurity (02/11/2024)  Housing: Low Risk  (02/11/2024)  Transportation Needs: No Transportation Needs (02/11/2024)  Utilities: Not At Risk (02/11/2024)  Social Connections: Socially Isolated (02/11/2024)  Tobacco Use: Low Risk  (02/12/2024)    Readmission Risk Interventions     No data to display

## 2024-03-19 DIAGNOSIS — R001 Bradycardia, unspecified: Secondary | ICD-10-CM | POA: Diagnosis not present

## 2024-03-19 MED ORDER — AMLODIPINE BESYLATE 5 MG PO TABS
5.0000 mg | ORAL_TABLET | Freq: Every day | ORAL | Status: DC
  Administered 2024-03-19 – 2024-03-29 (×10): 5 mg via ORAL
  Filled 2024-03-19 (×11): qty 1

## 2024-03-19 NOTE — Progress Notes (Signed)
 PROGRESS NOTE    Seth Thompson   ZOX:[id number] DOB: Aug 30, 1946  DOA: 02/11/2024 Date of Service: 03/19/24 which is hospital day 37  PCP: [facility] course / significant events:  Seth Thompson is a 78 y.o. male with medical history significant of essential hypertension, dementia, frequent PVCs, hyperlipidemia who was brought in from a facility after the patient lost consciousness while sitting on the shower chair at the facility.  He is admitted for further management and evaluation of altered mental status. Tx pneumonia, also severe bradycardia likely cause for syncope, but not good candidate for pacer. Upon arrival to the emergency room patient was found to be bradycardic in the 50s and had a drop of his pulse into the 20s.Echo showed EF 60 to 65% with normal diastolic parameters.Telemetry unremarkable. Currently financials are holding up dc to facility, pt is not safe to live alone      Consultants:  Cardiology General surgery  Palliative care  Procedures/Surgeries: none      ASSESSMENT & PLAN:   Acute metabolic encephalopathy on chronic cognitive impairment - now at baseline Severe dementia with behavioral disturbances Sun-downing seroquel  50 mg nightly Haldol  IM PRN  Syncope in the setting of bradycardia-improved Avoid AV nodal blocking agents. Cardiologist advised no intervention of permanent pacemaker at this time. Community-acquired pneumonia - resolved received Augmentin  for total 5 days. Recheck as needed  Hypokalemia Replace as needed Monitor BMP   Incarcerated right sided inguinal hernia No surgical intervention recommended by surgery team. Continue as needed pain medication   Essential hypertension, cont amlodipine  and losartan    Hyperlipidemia No statin    overweight based on BMI: Body mass index is 25.02 kg/m.Seth Thompson Significantly low or high BMI is associated with higher medical risk.  Underweight - under 18   overweight - 25 to 29 obese - 30 or more Class 1 obesity: BMI of 30.0 to 34 Class 2 obesity: BMI of 35.0 to 39 Class 3 obesity: BMI of 40.0 to 49 Super Morbid Obesity: BMI 50-59 Super-super Morbid Obesity: BMI 60+ Healthy nutrition and physical activity advised as adjunct to other disease management and risk reduction treatments    DVT prophylaxis: lovenox  IV fluids: no continuous IV fluids  Nutrition: regular Central lines / other devices: none  Code Status: DNR ACP documentation reviewed: has DNR order on file in [facility]  [facility] needs: placement Medical barriers to dispo: none.             Subjective / Brief ROS:  Patient reports no concerns Denies CP/SOB.  Pain controlled.    Family Communication: none at this time     Objective Findings:  Vitals:   03/18/24 1702 03/18/24 2010 03/19/24 0337 03/19/24 0833  BP: (!) 136/59 114/61 134/69 118/74  Pulse: 67 77 (!) 45 (!) 52  Resp: 16 20 18 18   Temp: 97.9 F (36.6 C) 97.6 F (36.4 C) 97.6 F (36.4 C) 98 F (36.7 C)  TempSrc: Axillary     SpO2: 90% 100% 97% 100%  Weight:      Height:        Intake/Output Summary (Last 24 hours) at 03/19/2024 1444 Last data filed at 03/19/2024 1300 Gross per 24 hour  Intake 240 ml  Output 700 ml  Net -460 ml   Filed Weights   02/11/24 0633 02/17/24 0405 02/20/24 2100  Weight: 89.9 kg 78.3 kg 79.1 kg    Examination:  Physical Exam  Cardiovascular:  Rate and Rhythm: Normal rate and regular rhythm.  Pulmonary:     Effort: Pulmonary effort is normal.     Breath sounds: Normal breath sounds.   Neurological:     Mental Status: He is alert. Mental status is at baseline.          Scheduled Medications:   amLODipine   5 mg Oral Daily   enoxaparin  (LOVENOX ) injection  40 mg Subcutaneous Q24H   feeding supplement  237 mL Oral BID BM   losartan   25 mg Oral Daily   melatonin  5 mg Oral QHS   QUEtiapine   50 mg Oral QHS   senna  1 tablet Oral Once per day on  Monday Wednesday Friday   sertraline   50 mg Oral QHS    Continuous Infusions:   PRN Medications:  guaiFENesin -dextromethorphan, haloperidol  lactate, hydrALAZINE , lactulose , polyethylene glycol  Antimicrobials from admission:  Anti-infectives (From admission, onward)    Start     Dose/Rate Route Frequency Ordered Stop   02/16/24 1530  amoxicillin -clavulanate (AUGMENTIN ) 875-125 MG per tablet 1 tablet        1 tablet Oral Every 12 hours 02/16/24 1435 02/20/24 2043   02/16/24 1500  cephALEXin  (KEFLEX ) capsule 500 mg  Status:  Discontinued        500 mg Oral 2 times daily 02/16/24 1406 02/16/24 1435   02/16/24 1300  cefTRIAXone  (ROCEPHIN ) 2 g in sodium chloride  0.9 % 100 mL IVPB  Status:  Discontinued        2 g 200 mL/hr over 30 Minutes Intravenous Every 24 hours 02/16/24 1221 02/16/24 1406   02/16/24 1300  azithromycin  (ZITHROMAX ) 500 mg in sodium chloride  0.9 % 250 mL IVPB  Status:  Discontinued        500 mg 250 mL/hr over 60 Minutes Intravenous Every 24 hours 02/16/24 1221 02/16/24 1406           Data Reviewed:  I have personally reviewed the following...  CBC: No results for input(s): WBC, NEUTROABS, HGB, HCT, MCV, PLT in the last 168 hours. Basic Metabolic Panel: Recent Labs  Lab 03/17/24 0451  NA 138  K 3.9  CL 106  CO2 26  GLUCOSE 113*  BUN 27*  CREATININE 0.87  CALCIUM 9.0   GFR: Estimated Creatinine Clearance: 73.4 mL/min (by C-G formula based on SCr of 0.87 mg/dL). Liver Function Tests: No results for input(s): AST, ALT, ALKPHOS, BILITOT, PROT, ALBUMIN in the last 168 hours. No results for input(s): LIPASE, AMYLASE in the last 168 hours. No results for input(s): AMMONIA in the last 168 hours. Coagulation Profile: No results for input(s): INR, PROTIME in the last 168 hours. Cardiac Enzymes: No results for input(s): CKTOTAL, CKMB, CKMBINDEX, TROPONINI in the last 168 hours. BNP (last 3 results) No results for  input(s): PROBNP in the last 8760 hours. HbA1C: No results for input(s): HGBA1C in the last 72 hours. CBG: No results for input(s): GLUCAP in the last 168 hours. Lipid Profile: No results for input(s): CHOL, HDL, LDLCALC, TRIG, CHOLHDL, LDLDIRECT in the last 72 hours. Thyroid  Function Tests: No results for input(s): TSH, T4TOTAL, FREET4, T3FREE, THYROIDAB in the last 72 hours. Anemia Panel: No results for input(s): VITAMINB12, FOLATE, FERRITIN, TIBC, IRON, RETICCTPCT in the last 72 hours. Most Recent Urinalysis On File:     Component Value Date/Time   COLORURINE YELLOW (A) 02/11/2024 0638   APPEARANCEUR CLEAR (A) 02/11/2024 0638   APPEARANCEUR Clear 05/06/2013 2002   LABSPEC 1.008 02/11/2024 0638   LABSPEC 1.017  05/06/2013 2002   PHURINE 6.0 02/11/2024 0638   GLUCOSEU NEGATIVE 02/11/2024 0638   GLUCOSEU Negative 05/06/2013 2002   HGBUR SMALL (A) 02/11/2024 0638   BILIRUBINUR NEGATIVE 02/11/2024 0638   BILIRUBINUR Negative 05/06/2013 2002   KETONESUR NEGATIVE 02/11/2024 0638   PROTEINUR NEGATIVE 02/11/2024 0638   NITRITE NEGATIVE 02/11/2024 0638   LEUKOCYTESUR NEGATIVE 02/11/2024 0638   LEUKOCYTESUR Negative 05/06/2013 2002   Sepsis Labs: @LABRCNTIP (procalcitonin:4,lacticidven:4) Microbiology: No results found for this or any previous visit (from the past 240 hours).    Radiology Studies last 3 days: No results found.        Aowyn Rozeboom, DO [facility] 03/19/2024, 2:44 PM    Dictation software may have been used to generate the above note. Typos may occur and escape review in typed/dictated notes. Please contact Dr Authur Leghorn directly for clarity if needed.  Staff may message me via secure chat in Epic  but this may not receive an immediate response,  please page me for urgent matters!  If 7PM-7AM, please contact night coverage [email or web address]

## 2024-03-19 NOTE — Progress Notes (Signed)
 Mobility Specialist - Progress Note   03/19/24 1442  Mobility  Activity Ambulated with assistance in hallway  Level of Assistance Minimal assist, patient does 75% or more  Assistive Device  (HHA)  Distance Ambulated (ft) 160 ft  Activity Response Tolerated well  Mobility visit 1 Mobility  Mobility Specialist Start Time (ACUTE ONLY) 1416  Mobility Specialist Stop Time (ACUTE ONLY) 1436  Mobility Specialist Time Calculation (min) (ACUTE ONLY) 20 min   Pt fowler upon entry, utilizing RA. Pt agreeable to OOB amb this date. Pt completed bed mob HHA to bring trunk from sup to sit, ModA to bring BLE EOB--- able to self support trunk once seated EOB. Pt STS MinA, stood at the EOB while MS completed peri care MaxA. Pt amb one lap around the NS with HHA and min directional cuing, chair follow for safety. Pt returned to the room, left semi fowler with alarm set and needs within reach.  Versa Gore Mobility Specialist 03/19/24 2:50 PM

## 2024-03-19 NOTE — TOC Progression Note (Signed)
 Transition of Care [facility]) - Progression Note    Patient Details  Name: Seth Thompson MRN: [id number] Date of Birth: [birth year]  Transition of Care [facility]) CM/SW Contact  Loman Risk, RN Phone Number: 03/19/2024, 9:14 AM  Clinical Narrative:    Message sent to Fredrik Jensen with DSS and Baruch Likens with [facility] to determine if there is an update from assessment completed yesterday   Expected Discharge Plan: Skilled Nursing Facility Barriers to Discharge: Continued Medical Work up  Expected Discharge Plan and Services       Living arrangements for the past 2 months: Assisted Living Facility                                       Social Determinants of Health (SDOH) Interventions SDOH Screenings   Food Insecurity: No Food Insecurity (02/11/2024)  Housing: Low Risk  (02/11/2024)  Transportation Needs: No Transportation Needs (02/11/2024)  Utilities: Not At Risk (02/11/2024)  Social Connections: Socially Isolated (02/11/2024)  Tobacco Use: Low Risk  (02/12/2024)    Readmission Risk Interventions     No data to display

## 2024-03-19 NOTE — TOC Progression Note (Signed)
 Transition of Care [facility]) - Progression Note    Patient Details  Name: Seth Thompson MRN: [id number] Date of Birth: 1945/12/21  [facility]) CM/SW Contact  Loman Risk, RN Phone Number: 03/19/2024, 2:58 PM  Clinical Narrative:     Per Baruch Likens at [facility] they are still unable to accept patient back at this time.  Fredrik Jensen with DSS aware and is still working to reach guardian of estate for payment to [facility]  Expected Discharge Plan: Skilled Nursing Facility Barriers to Discharge: Continued Medical Work up  Expected Discharge Plan and Services       Living arrangements for the past 2 months: Assisted Living Facility                                       Social Determinants of Health (SDOH) Interventions SDOH Screenings   Food Insecurity: No Food Insecurity (02/11/2024)  Housing: Low Risk  (02/11/2024)  Transportation Needs: No Transportation Needs (02/11/2024)  Utilities: Not At Risk (02/11/2024)  Social Connections: Socially Isolated (02/11/2024)  Tobacco Use: Low Risk  (02/12/2024)    Readmission Risk Interventions     No data to display

## 2024-03-19 NOTE — Plan of Care (Signed)

## 2024-03-20 DIAGNOSIS — R001 Bradycardia, unspecified: Secondary | ICD-10-CM | POA: Diagnosis not present

## 2024-03-20 NOTE — Plan of Care (Signed)

## 2024-03-20 NOTE — Progress Notes (Signed)
 PROGRESS NOTE    Seth Thompson   ZOX:[id number] DOB: July 28, 1946  DOA: 02/11/2024 Date of Service: 03/20/24 which is hospital day 38  PCP: [facility] course / significant events:  Seth Thompson is a 78 y.o. male with medical history significant of essential hypertension, dementia, frequent PVCs, hyperlipidemia who was brought in from a facility after the patient lost consciousness while sitting on the shower chair at the facility.  He is admitted for further management and evaluation of altered mental status. Tx pneumonia, also severe bradycardia likely cause for syncope, but not good candidate for pacer. Upon arrival to the emergency room patient was found to be bradycardic in the 50s and had a drop of his pulse into the 20s.Echo showed EF 60 to 65% with normal diastolic parameters.Telemetry unremarkable. Currently financials are holding up dc to facility, pt is not safe to live alone      Consultants:  Cardiology General surgery  Palliative care  Procedures/Surgeries: none      ASSESSMENT & PLAN:   Acute metabolic encephalopathy on chronic cognitive impairment - now at baseline Severe dementia with behavioral disturbances Sun-downing seroquel  50 mg nightly Haldol  IM PRN  Syncope in the setting of bradycardia-improved Avoid AV nodal blocking agents. Cardiologist advised no intervention of permanent pacemaker at this time. Community-acquired pneumonia - resolved received Augmentin  for total 5 days. Recheck as needed  Hypokalemia Replace as needed Monitor BMP   Incarcerated right sided inguinal hernia No surgical intervention recommended by surgery team. Continue as needed pain medication   Essential hypertension, cont amlodipine  and losartan    Hyperlipidemia No statin    overweight based on BMI: Body mass index is 25.02 kg/m.[name] Significantly low or high BMI is associated with higher medical risk.  Underweight - under 18   overweight - 25 to 29 obese - 30 or more Class 1 obesity: BMI of 30.0 to 34 Class 2 obesity: BMI of 35.0 to 39 Class 3 obesity: BMI of 40.0 to 49 Super Morbid Obesity: BMI 50-59 Super-super Morbid Obesity: BMI 60+ Healthy nutrition and physical activity advised as adjunct to other disease management and risk reduction treatments    DVT prophylaxis: lovenox  IV fluids: no continuous IV fluids  Nutrition: regular Central lines / other devices: none  Code Status: DNR ACP documentation reviewed: has DNR order on file in [facility]  [facility] needs: placement Medical barriers to dispo: none.             Subjective / Brief ROS:  Patient reports no concerns Denies CP/SOB.  Pain controlled.    Family Communication: none at this time     Objective Findings:  Vitals:   03/19/24 1601 03/19/24 2106 03/20/24 0428 03/20/24 0737  BP: 129/71 (!) 121/91 (!) 124/55 (!) 155/71  Pulse: 62 (!) 53 (!) 46 60  Resp: 18 18 20 18   Temp: (!) 97.5 F (36.4 C) 98.3 F (36.8 C) (!) 97.3 F (36.3 C) (!) 97.5 F (36.4 C)  TempSrc:  Oral    SpO2: 99% 94% 99% 100%  Weight:      Height:        Intake/Output Summary (Last 24 hours) at 03/20/2024 1458 Last data filed at 03/20/2024 0445 Gross per 24 hour  Intake --  Output 1550 ml  Net -1550 ml   Filed Weights   02/11/24 0633 02/17/24 0405 02/20/24 2100  Weight: 89.9 kg 78.3 kg 79.1 kg    Examination:  Physical Exam Constitutional:  General: He is not in acute distress.    Appearance: He is not ill-appearing.  Pulmonary:     Effort: Pulmonary effort is normal.     Breath sounds: Normal breath sounds.   Neurological:     Mental Status: He is alert. Mental status is at baseline.   Psychiatric:        Behavior: Behavior normal.          Scheduled Medications:   amLODipine   5 mg Oral Daily   enoxaparin  (LOVENOX ) injection  40 mg Subcutaneous Q24H   feeding supplement  237 mL Oral BID BM   losartan   25 mg Oral Daily    melatonin  5 mg Oral QHS   QUEtiapine   50 mg Oral QHS   senna  1 tablet Oral Once per day on Monday Wednesday Friday   sertraline   50 mg Oral QHS    Continuous Infusions:   PRN Medications:  guaiFENesin -dextromethorphan, haloperidol  lactate, hydrALAZINE , lactulose , polyethylene glycol  Antimicrobials from admission:  Anti-infectives (From admission, onward)    Start     Dose/Rate Route Frequency Ordered Stop   02/16/24 1530  amoxicillin -clavulanate (AUGMENTIN ) 875-125 MG per tablet 1 tablet        1 tablet Oral Every 12 hours 02/16/24 1435 02/20/24 2043   02/16/24 1500  cephALEXin  (KEFLEX ) capsule 500 mg  Status:  Discontinued        500 mg Oral 2 times daily 02/16/24 1406 02/16/24 1435   02/16/24 1300  cefTRIAXone  (ROCEPHIN ) 2 g in sodium chloride  0.9 % 100 mL IVPB  Status:  Discontinued        2 g 200 mL/hr over 30 Minutes Intravenous Every 24 hours 02/16/24 1221 02/16/24 1406   02/16/24 1300  azithromycin  (ZITHROMAX ) 500 mg in sodium chloride  0.9 % 250 mL IVPB  Status:  Discontinued        500 mg 250 mL/hr over 60 Minutes Intravenous Every 24 hours 02/16/24 1221 02/16/24 1406           Data Reviewed:  I have personally reviewed the following...  CBC: No results for input(s): WBC, NEUTROABS, HGB, HCT, MCV, PLT in the last 168 hours. Basic Metabolic Panel: Recent Labs  Lab 03/17/24 0451  NA 138  K 3.9  CL 106  CO2 26  GLUCOSE 113*  BUN 27*  CREATININE 0.87  CALCIUM 9.0   GFR: Estimated Creatinine Clearance: 73.4 mL/min (by C-G formula based on SCr of 0.87 mg/dL). Liver Function Tests: No results for input(s): AST, ALT, ALKPHOS, BILITOT, PROT, ALBUMIN in the last 168 hours. No results for input(s): LIPASE, AMYLASE in the last 168 hours. No results for input(s): AMMONIA in the last 168 hours. Coagulation Profile: No results for input(s): INR, PROTIME in the last 168 hours. Cardiac Enzymes: No results for input(s):  CKTOTAL, CKMB, CKMBINDEX, TROPONINI in the last 168 hours. BNP (last 3 results) No results for input(s): PROBNP in the last 8760 hours. HbA1C: No results for input(s): HGBA1C in the last 72 hours. CBG: No results for input(s): GLUCAP in the last 168 hours. Lipid Profile: No results for input(s): CHOL, HDL, LDLCALC, TRIG, CHOLHDL, LDLDIRECT in the last 72 hours. Thyroid  Function Tests: No results for input(s): TSH, T4TOTAL, FREET4, T3FREE, THYROIDAB in the last 72 hours. Anemia Panel: No results for input(s): VITAMINB12, FOLATE, FERRITIN, TIBC, IRON, RETICCTPCT in the last 72 hours. Most Recent Urinalysis On File:     Component Value Date/Time   COLORURINE YELLOW (A) 02/11/2024 1610   APPEARANCEUR  CLEAR (A) 02/11/2024 0638   APPEARANCEUR Clear 05/06/2013 2002   LABSPEC 1.008 02/11/2024 0638   LABSPEC 1.017 05/06/2013 2002   PHURINE 6.0 02/11/2024 0638   GLUCOSEU NEGATIVE 02/11/2024 0638   GLUCOSEU Negative 05/06/2013 2002   HGBUR SMALL (A) 02/11/2024 0638   BILIRUBINUR NEGATIVE 02/11/2024 0638   BILIRUBINUR Negative 05/06/2013 2002   KETONESUR NEGATIVE 02/11/2024 0638   PROTEINUR NEGATIVE 02/11/2024 0638   NITRITE NEGATIVE 02/11/2024 0638   LEUKOCYTESUR NEGATIVE 02/11/2024 0638   LEUKOCYTESUR Negative 05/06/2013 2002   Sepsis Labs: @LABRCNTIP (procalcitonin:4,lacticidven:4) Microbiology: No results found for this or any previous visit (from the past 240 hours).    Radiology Studies last 3 days: No results found.        Kenzi Bardwell, DO [facility] 03/20/2024, 2:58 PM    Dictation software may have been used to generate the above note. Typos may occur and escape review in typed/dictated notes. Please contact Dr Authur Leghorn directly for clarity if needed.  Staff may message me via secure chat in Epic  but this may not receive an immediate response,  please page me for urgent matters!  If 7PM-7AM, please  contact night coverage [email or web address]

## 2024-03-20 NOTE — Plan of Care (Signed)

## 2024-03-21 LAB — BASIC METABOLIC PANEL WITH GFR
Anion gap: 7 (ref 5–15)
BUN: 25 mg/dL — ABNORMAL HIGH (ref 8–23)
CO2: 27 mmol/L (ref 22–32)
Calcium: 9.1 mg/dL (ref 8.9–10.3)
Chloride: 105 mmol/L (ref 98–111)
Creatinine, Ser: 1.01 mg/dL (ref 0.61–1.24)
GFR, Estimated: >60 mL/min (ref >60)
Glucose, Bld: 108 mg/dL — ABNORMAL HIGH (ref 70–99)
Potassium: 4 mmol/L (ref 3.5–5.1)
Sodium: 139 mmol/L (ref 135–145)

## 2024-03-21 LAB — CBC
HCT: 44.3 % (ref 39.0–52.0)
Hemoglobin: 15.2 g/dL (ref 13.0–17.0)
MCH: 31 pg (ref 26.0–34.0)
MCHC: 34.3 g/dL (ref 30.0–36.0)
MCV: 90.4 fL (ref 80.0–100.0)
Platelets: 152 10*3/uL (ref 150–400)
RBC: 4.9 MIL/uL (ref 4.22–5.81)
RDW: 12.6 % (ref 11.5–15.5)
WBC: 5.7 10*3/uL (ref 4.0–10.5)
nRBC: 0 % (ref 0.0–0.2)

## 2024-03-21 NOTE — Plan of Care (Signed)

## 2024-03-21 NOTE — Progress Notes (Signed)
 PROGRESS NOTE    Seth Thompson   XBJ:[id number] DOB: 08/02/1946  DOA: 02/11/2024 Date of Service: 03/21/24 which is hospital day 39  PCP: [facility] course / significant events:  Seth Thompson is a 78 y.o. male with medical history significant of essential hypertension, dementia, frequent PVCs, hyperlipidemia who was brought in from a facility after the patient lost consciousness while sitting on the shower chair at the facility.  He is admitted for further management and evaluation of altered mental status. Tx pneumonia, also severe bradycardia likely cause for syncope, but not good candidate for pacer. Upon arrival to the emergency room patient was found to be bradycardic in the 50s and had a drop of his pulse into the 20s.Echo showed EF 60 to 65% with normal diastolic parameters.Telemetry unremarkable. Currently financials are holding up dc to facility, pt is not safe to live alone      Consultants:  Cardiology General surgery  Palliative care  Procedures/Surgeries: none      ASSESSMENT & PLAN:   Acute metabolic encephalopathy on chronic cognitive impairment - now at baseline Severe dementia with behavioral disturbances Sun-downing seroquel  50 mg nightly Haldol  IM PRN  Syncope in the setting of bradycardia-improved Avoid AV nodal blocking agents. Cardiologist advised no intervention of permanent pacemaker at this time. Community-acquired pneumonia - resolved received Augmentin  for total 5 days. Recheck as needed  Hypokalemia Replace as needed Monitor BMP   Incarcerated right sided inguinal hernia No surgical intervention recommended by surgery team. Continue as needed pain medication   Essential hypertension, cont amlodipine  and losartan    Hyperlipidemia No statin    overweight based on BMI: Body mass index is 25.02 kg/m.Seth Thompson Significantly low or high BMI is associated with higher medical risk.  Underweight - under 18   overweight - 25 to 29 obese - 30 or more Class 1 obesity: BMI of 30.0 to 34 Class 2 obesity: BMI of 35.0 to 39 Class 3 obesity: BMI of 40.0 to 49 Super Morbid Obesity: BMI 50-59 Super-super Morbid Obesity: BMI 60+ Healthy nutrition and physical activity advised as adjunct to other disease management and risk reduction treatments    DVT prophylaxis: lovenox  IV fluids: no continuous IV fluids  Nutrition: regular Central lines / other devices: none  Code Status: DNR ACP documentation reviewed: has DNR order on file in [facility]  [facility] needs: placement Medical barriers to dispo: none.             Subjective / Brief ROS:  Patient reports no concerns Denies CP/SOB.  Pain controlled.    Family Communication: none at this time     Objective Findings:  Vitals:   03/20/24 1540 03/20/24 1954 03/21/24 0337 03/21/24 0900  BP: 137/68 (!) 166/112 (!) 146/63 139/84  Pulse: 64 75 (!) 51 (!) 49  Resp: 16 18 18 16   Temp: 97.7 F (36.5 C) 98.7 F (37.1 C)  (!) 97.5 F (36.4 C)  TempSrc:  Oral    SpO2: 95% 99% 96% 97%  Weight:      Height:        Intake/Output Summary (Last 24 hours) at 03/21/2024 1305 Last data filed at 03/21/2024 1040 Gross per 24 hour  Intake 360 ml  Output 200 ml  Net 160 ml   Filed Weights   02/11/24 0633 02/17/24 0405 02/20/24 2100  Weight: 89.9 kg 78.3 kg 79.1 kg    Examination:  Physical Exam Constitutional:      General:  He is not in acute distress.    Appearance: He is not ill-appearing.  Pulmonary:     Effort: Pulmonary effort is normal.     Breath sounds: Normal breath sounds.   Neurological:     Mental Status: He is alert. Mental status is at baseline.   Psychiatric:        Behavior: Behavior normal.          Scheduled Medications:   amLODipine   5 mg Oral Daily   enoxaparin  (LOVENOX ) injection  40 mg Subcutaneous Q24H   feeding supplement  237 mL Oral BID BM   losartan   25 mg Oral Daily   melatonin  5 mg Oral QHS    QUEtiapine   50 mg Oral QHS   senna  1 tablet Oral Once per day on Monday Wednesday Friday   sertraline   50 mg Oral QHS    Continuous Infusions:   PRN Medications:  guaiFENesin -dextromethorphan, haloperidol  lactate, hydrALAZINE , lactulose , polyethylene glycol  Antimicrobials from admission:  Anti-infectives (From admission, onward)    Start     Dose/Rate Route Frequency Ordered Stop   02/16/24 1530  amoxicillin -clavulanate (AUGMENTIN ) 875-125 MG per tablet 1 tablet        1 tablet Oral Every 12 hours 02/16/24 1435 02/20/24 2043   02/16/24 1500  cephALEXin  (KEFLEX ) capsule 500 mg  Status:  Discontinued        500 mg Oral 2 times daily 02/16/24 1406 02/16/24 1435   02/16/24 1300  cefTRIAXone  (ROCEPHIN ) 2 g in sodium chloride  0.9 % 100 mL IVPB  Status:  Discontinued        2 g 200 mL/hr over 30 Minutes Intravenous Every 24 hours 02/16/24 1221 02/16/24 1406   02/16/24 1300  azithromycin  (ZITHROMAX ) 500 mg in sodium chloride  0.9 % 250 mL IVPB  Status:  Discontinued        500 mg 250 mL/hr over 60 Minutes Intravenous Every 24 hours 02/16/24 1221 02/16/24 1406           Data Reviewed:  I have personally reviewed the following...  CBC: Recent Labs  Lab 03/21/24 0513  WBC 5.7  HGB 15.2  HCT 44.3  MCV 90.4  PLT 152   Basic Metabolic Panel: Recent Labs  Lab 03/17/24 0451 03/21/24 0513  NA 138 139  K 3.9 4.0  CL 106 105  CO2 26 27  GLUCOSE 113* 108*  BUN 27* 25*  CREATININE 0.87 1.01  CALCIUM 9.0 9.1   GFR: Estimated Creatinine Clearance: 63.2 mL/min (by C-G formula based on SCr of 1.01 mg/dL). Liver Function Tests: No results for input(s): AST, ALT, ALKPHOS, BILITOT, PROT, ALBUMIN in the last 168 hours. No results for input(s): LIPASE, AMYLASE in the last 168 hours. No results for input(s): AMMONIA in the last 168 hours. Coagulation Profile: No results for input(s): INR, PROTIME in the last 168 hours. Cardiac Enzymes: No results for  input(s): CKTOTAL, CKMB, CKMBINDEX, TROPONINI in the last 168 hours. BNP (last 3 results) No results for input(s): PROBNP in the last 8760 hours. HbA1C: No results for input(s): HGBA1C in the last 72 hours. CBG: No results for input(s): GLUCAP in the last 168 hours. Lipid Profile: No results for input(s): CHOL, HDL, LDLCALC, TRIG, CHOLHDL, LDLDIRECT in the last 72 hours. Thyroid  Function Tests: No results for input(s): TSH, T4TOTAL, FREET4, T3FREE, THYROIDAB in the last 72 hours. Anemia Panel: No results for input(s): VITAMINB12, FOLATE, FERRITIN, TIBC, IRON, RETICCTPCT in the last 72 hours. Most Recent Urinalysis On File:  Component Value Date/Time   COLORURINE YELLOW (A) 02/11/2024 0638   APPEARANCEUR CLEAR (A) 02/11/2024 0638   APPEARANCEUR Clear 05/06/2013 2002   LABSPEC 1.008 02/11/2024 0638   LABSPEC 1.017 05/06/2013 2002   PHURINE 6.0 02/11/2024 0638   GLUCOSEU NEGATIVE 02/11/2024 0638   GLUCOSEU Negative 05/06/2013 2002   HGBUR SMALL (A) 02/11/2024 0638   BILIRUBINUR NEGATIVE 02/11/2024 0638   BILIRUBINUR Negative 05/06/2013 2002   KETONESUR NEGATIVE 02/11/2024 0638   PROTEINUR NEGATIVE 02/11/2024 0638   NITRITE NEGATIVE 02/11/2024 0638   LEUKOCYTESUR NEGATIVE 02/11/2024 0638   LEUKOCYTESUR Negative 05/06/2013 2002   Sepsis Labs: @LABRCNTIP (procalcitonin:4,lacticidven:4) Microbiology: No results found for this or any previous visit (from the past 240 hours).    Radiology Studies last 3 days: No results found.        Boyd Buffalo, DO [facility] 03/21/2024, 1:05 PM    Dictation software may have been used to generate the above note. Typos may occur and escape review in typed/dictated notes. Please contact Dr Authur Leghorn directly for clarity if needed.  Staff may message me via secure chat in Epic  but this may not receive an immediate response,  please page me for urgent matters!  If  7PM-7AM, please contact night coverage [email or web address]

## 2024-03-22 DIAGNOSIS — R001 Bradycardia, unspecified: Secondary | ICD-10-CM | POA: Diagnosis not present

## 2024-03-22 NOTE — Progress Notes (Signed)
 Physical Therapy Treatment Patient Details Name: Seth Thompson MRN: [id number] DOB: [birth year] Today's Date: 03/22/2024   History of Present Illness Pt is a 78 year old male admitted after LOC in shower, admitted with acute metabolic encephalopathy, syncope in the setting of bradycardia, hypokalemia, incarcerated right sided inguinal hernia. PMH significant for essential hypertension, dementia, frequent PVCs, and hyperlipidemia.    PT Comments  Patient in recliner on arrival and agreeable to PT treatment session. Required minA to stand from recliner with HHAx1. Ambulated 160' with HHAx1 and minA + chair follow for safety. Patient demonstrating short shuffling steps which is potentially baseline for patient. Cognition is at baseline with difficulty expressing self and oriented to self only. Discharge plan remains appropriate.    If plan is discharge home, recommend the following: A lot of help with walking and/or transfers;A lot of help with bathing/dressing/bathroom;Assistance with cooking/housework;Assistance with feeding;Help with stairs or ramp for entrance   Can travel by private vehicle     No  Equipment Recommendations  None recommended by PT    Recommendations for Other Services       Precautions / Restrictions Precautions Precautions: Fall Recall of Precautions/Restrictions: Impaired Restrictions Weight Bearing Restrictions Per Provider Order: No     Mobility  Bed Mobility               General bed mobility comments: in recliner on arrival    Transfers Overall transfer level: Needs assistance Equipment used: 1 person hand held assist Transfers: Sit to/from Stand Sit to Stand: Min assist                Ambulation/Gait Ambulation/Gait assistance: Min assist Gait Distance (Feet): 160 Feet Assistive device: 1 person hand held assist Gait Pattern/deviations: Step-through pattern, Decreased step length - right, Decreased step length - left Gait  velocity: decreased     General Gait Details: short shuffling steps with HHA. Frequent cueing for increased step length   Stairs             Wheelchair Mobility     Tilt Bed    Modified Rankin (Stroke Patients Only)       Balance Overall balance assessment: Needs assistance Sitting-balance support: Feet supported, No upper extremity supported, Bilateral upper extremity supported, Feet unsupported Sitting balance-Leahy Scale: Poor Sitting balance - Comments: initial poor balance leaning posteriorly and having difficulty time maintaining enough hip flexion to keep feet on the ground in sitting, improving with time/cues   Standing balance support: Single extremity supported, During functional activity Standing balance-Leahy Scale: Poor                              Communication Communication Communication: Impaired Factors Affecting Communication: Difficulty expressing self  Cognition Arousal: Alert Behavior During Therapy: WFL for tasks assessed/performed   PT - Cognitive impairments: History of cognitive impairments                         Following commands: Impaired Following commands impaired: Follows one step commands inconsistently    Cueing Cueing Techniques: Verbal cues, Gestural cues, Tactile cues, Visual cues  Exercises      General Comments        Pertinent Vitals/Pain Pain Assessment Pain Assessment: Faces Faces Pain Scale: No hurt Pain Intervention(s): Monitored during session    Home Living  Prior Function            PT Goals (current goals can now be found in the care plan section) Acute Rehab PT Goals PT Goal Formulation: Patient unable to participate in goal setting Time For Goal Achievement: 03/12/24 Potential to Achieve Goals: Fair Progress towards PT goals: Progressing toward goals    Frequency    Min 1X/week      PT Plan      Co-evaluation               AM-PAC PT 6 Clicks Mobility   Outcome Measure  Help needed turning from your back to your side while in a flat bed without using bedrails?: A Little Help needed moving from lying on your back to sitting on the side of a flat bed without using bedrails?: A Little Help needed moving to and from a bed to a chair (including a wheelchair)?: A Little Help needed standing up from a chair using your arms (e.g., wheelchair or bedside chair)?: A Little Help needed to walk in hospital room?: A Little Help needed climbing 3-5 steps with a railing? : Total 6 Click Score: 16    End of Session   Activity Tolerance: Patient tolerated treatment well Patient left: in chair;with chair alarm set;with call bell/phone within reach Nurse Communication: Mobility status PT Visit Diagnosis: Unsteadiness on feet (R26.81);Difficulty in walking, not elsewhere classified (R26.2);Muscle weakness (generalized) (M62.81)     Time: 1478-2956 PT Time Calculation (min) (ACUTE ONLY): 16 min  Charges:    $Therapeutic Activity: 8-22 mins PT General Charges $$ ACUTE PT VISIT: 1 Visit                     Janine Melbourne, PT, DPT Physical Therapist - [facility]  [facility]    Etienne Mowers A Saharsh Sterling 03/22/2024, 1:17 PM

## 2024-03-22 NOTE — Plan of Care (Signed)

## 2024-03-22 NOTE — Progress Notes (Signed)
 Mobility Specialist - Progress Note   03/22/24 1534  Mobility  Activity Ambulated with assistance in hallway  Level of Assistance Minimal assist, patient does 75% or more  Assistive Device None  Distance Ambulated (ft) 160 ft  Activity Response Tolerated well  Mobility visit 1 Mobility  Mobility Specialist Start Time (ACUTE ONLY) 1503  Mobility Specialist Stop Time (ACUTE ONLY) 1528  Mobility Specialist Time Calculation (min) (ACUTE ONLY) 25 min   Pt sitting in the recliner upon entry, utilizing RA. Pt STS from recliner MinA, stood at bedside while MS completed peri care MaxA. Pt amb one lap around the NS HHA of the RUE with chair follow (not utilized), easily distracted requiring constant redirecting to task. Pt returned to the room, left supine with alarm set and needs within reach.  Versa Gore Mobility Specialist 03/22/24 4:04 PM

## 2024-03-22 NOTE — Progress Notes (Signed)
 PROGRESS NOTE    Seth Thompson   RUE:[id number] DOB: 1946/01/26  DOA: 02/11/2024 Date of Service: 03/22/24 which is hospital day 40  PCP: [facility] course / significant events:  Seth Thompson is a 79 y.o. male with medical history significant of essential hypertension, dementia, frequent PVCs, hyperlipidemia who was brought in from a facility after the patient lost consciousness while sitting on the shower chair at the facility.  Seth Thompson is admitted for further management and evaluation of altered mental status. Tx pneumonia, also severe bradycardia likely cause for syncope, but not good candidate for pacer. Upon arrival to the emergency room patient was found to be bradycardic in the 50s and had a drop of his pulse into the 20s.Echo showed EF 60 to 65% with normal diastolic parameters.Telemetry unremarkable. Currently financials are holding up dc to facility, pt is not safe to live alone      Consultants:  Cardiology General surgery  Palliative care  Procedures/Surgeries: none      ASSESSMENT & PLAN:   Acute metabolic encephalopathy on chronic cognitive impairment - now at baseline Severe dementia with behavioral disturbances Sun-downing seroquel  50 mg nightly Haldol  IM PRN  Syncope in the setting of bradycardia-improved Avoid AV nodal blocking agents. Cardiologist advised no intervention of permanent pacemaker at this time.  Community-acquired pneumonia - resolved received Augmentin  for total 5 days. Recheck as needed  Hypokalemia Replace as needed Monitor BMP periodically   Incarcerated right sided inguinal hernia No surgical intervention recommended by surgery team. Continue as needed pain medication   Essential hypertension, cont amlodipine  and losartan    Hyperlipidemia No statin    overweight based on BMI: Body mass index is 25.02 kg/m.Seth Thompson Significantly low or high BMI is associated with higher medical risk.   Underweight - under 18  overweight - 25 to 29 obese - 30 or more Class 1 obesity: BMI of 30.0 to 34 Class 2 obesity: BMI of 35.0 to 39 Class 3 obesity: BMI of 40.0 to 49 Super Morbid Obesity: BMI 50-59 Super-super Morbid Obesity: BMI 60+ Healthy nutrition and physical activity advised as adjunct to other disease management and risk reduction treatments    DVT prophylaxis: lovenox  IV fluids: no continuous IV fluids  Nutrition: regular Central lines / other devices: none  Code Status: DNR ACP documentation reviewed: has DNR order on file in [facility]  [facility] needs: placement Medical barriers to dispo: none.             Subjective / Brief ROS:  Patient reports no concerns Denies CP/SOB.  Pain controlled.    Family Communication: none at this time     Objective Findings:  Vitals:   03/21/24 0337 03/21/24 0900 03/21/24 1613 03/21/24 2145  BP: (!) 146/63 139/84 128/67 122/82  Pulse: (!) 51 (!) 49 100 (!) 50  Resp: 18 16 16 18   Temp:  (!) 97.5 F (36.4 C) (!) 97 F (36.1 C) 97.6 F (36.4 C)  TempSrc:    Oral  SpO2: 96% 97% 100% 97%  Weight:      Height:        Intake/Output Summary (Last 24 hours) at 03/22/2024 1408 Last data filed at 03/22/2024 1015 Gross per 24 hour  Intake 420 ml  Output 1750 ml  Net -1330 ml   Filed Weights   02/11/24 0633 02/17/24 0405 02/20/24 2100  Weight: 89.9 kg 78.3 kg 79.1 kg    Examination:  Physical Exam Constitutional:  General: Seth Thompson is not in acute distress.    Appearance: Seth Thompson is not ill-appearing.   Cardiovascular:     Rate and Rhythm: Normal rate and regular rhythm.  Pulmonary:     Effort: Pulmonary effort is normal.     Breath sounds: Normal breath sounds.   Musculoskeletal:     Right lower leg: No edema.     Left lower leg: No edema.   Skin:    General: Skin is warm and dry.   Neurological:     Mental Status: Seth Thompson is alert. Mental status is at baseline. Seth Thompson is disoriented.   Psychiatric:        Mood  and Affect: Mood normal.        Behavior: Behavior normal.          Scheduled Medications:   amLODipine   5 mg Oral Daily   enoxaparin  (LOVENOX ) injection  40 mg Subcutaneous Q24H   feeding supplement  237 mL Oral BID BM   losartan   25 mg Oral Daily   melatonin  5 mg Oral QHS   QUEtiapine   50 mg Oral QHS   senna  1 tablet Oral Once per day on Monday Wednesday Friday   sertraline   50 mg Oral QHS    Continuous Infusions:   PRN Medications:  guaiFENesin -dextromethorphan, haloperidol  lactate, hydrALAZINE , lactulose , polyethylene glycol  Antimicrobials from admission:  Anti-infectives (From admission, onward)    Start     Dose/Rate Route Frequency Ordered Stop   02/16/24 1530  amoxicillin -clavulanate (AUGMENTIN ) 875-125 MG per tablet 1 tablet        1 tablet Oral Every 12 hours 02/16/24 1435 02/20/24 2043   02/16/24 1500  cephALEXin  (KEFLEX ) capsule 500 mg  Status:  Discontinued        500 mg Oral 2 times daily 02/16/24 1406 02/16/24 1435   02/16/24 1300  cefTRIAXone  (ROCEPHIN ) 2 g in sodium chloride  0.9 % 100 mL IVPB  Status:  Discontinued        2 g 200 mL/hr over 30 Minutes Intravenous Every 24 hours 02/16/24 1221 02/16/24 1406   02/16/24 1300  azithromycin  (ZITHROMAX ) 500 mg in sodium chloride  0.9 % 250 mL IVPB  Status:  Discontinued        500 mg 250 mL/hr over 60 Minutes Intravenous Every 24 hours 02/16/24 1221 02/16/24 1406           Data Reviewed:  I have personally reviewed the following...  CBC: Recent Labs  Lab 03/21/24 0513  WBC 5.7  HGB 15.2  HCT 44.3  MCV 90.4  PLT 152   Basic Metabolic Panel: Recent Labs  Lab 03/17/24 0451 03/21/24 0513  NA 138 139  K 3.9 4.0  CL 106 105  CO2 26 27  GLUCOSE 113* 108*  BUN 27* 25*  CREATININE 0.87 1.01  CALCIUM 9.0 9.1   GFR: Estimated Creatinine Clearance: 63.2 mL/min (by C-G formula based on SCr of 1.01 mg/dL). Liver Function Tests: No results for input(s): AST, ALT, ALKPHOS, BILITOT,  PROT, ALBUMIN in the last 168 hours. No results for input(s): LIPASE, AMYLASE in the last 168 hours. No results for input(s): AMMONIA in the last 168 hours. Coagulation Profile: No results for input(s): INR, PROTIME in the last 168 hours. Cardiac Enzymes: No results for input(s): CKTOTAL, CKMB, CKMBINDEX, TROPONINI in the last 168 hours. BNP (last 3 results) No results for input(s): PROBNP in the last 8760 hours. HbA1C: No results for input(s): HGBA1C in the last 72 hours. CBG: No results  for input(s): GLUCAP in the last 168 hours. Lipid Profile: No results for input(s): CHOL, HDL, LDLCALC, TRIG, CHOLHDL, LDLDIRECT in the last 72 hours. Thyroid  Function Tests: No results for input(s): TSH, T4TOTAL, FREET4, T3FREE, THYROIDAB in the last 72 hours. Anemia Panel: No results for input(s): VITAMINB12, FOLATE, FERRITIN, TIBC, IRON, RETICCTPCT in the last 72 hours. Most Recent Urinalysis On File:     Component Value Date/Time   COLORURINE YELLOW (A) 02/11/2024 0638   APPEARANCEUR CLEAR (A) 02/11/2024 0638   APPEARANCEUR Clear 05/06/2013 2002   LABSPEC 1.008 02/11/2024 0638   LABSPEC 1.017 05/06/2013 2002   PHURINE 6.0 02/11/2024 0638   GLUCOSEU NEGATIVE 02/11/2024 0638   GLUCOSEU Negative 05/06/2013 2002   HGBUR SMALL (A) 02/11/2024 0638   BILIRUBINUR NEGATIVE 02/11/2024 0638   BILIRUBINUR Negative 05/06/2013 2002   KETONESUR NEGATIVE 02/11/2024 0638   PROTEINUR NEGATIVE 02/11/2024 0638   NITRITE NEGATIVE 02/11/2024 0638   LEUKOCYTESUR NEGATIVE 02/11/2024 0638   LEUKOCYTESUR Negative 05/06/2013 2002   Sepsis Labs: @LABRCNTIP (procalcitonin:4,lacticidven:4) Microbiology: No results found for this or any previous visit (from the past 240 hours).    Radiology Studies last 3 days: No results found.        Kamron Vanwyhe, DO [facility] 03/22/2024, 2:08 PM    Dictation software may have been used to  generate the above note. Typos may occur and escape review in typed/dictated notes. Please contact Dr Authur Leghorn directly for clarity if needed.  Staff may message me via secure chat in Epic  but this may not receive an immediate response,  please page me for urgent matters!  If 7PM-7AM, please contact night coverage [email or web address]

## 2024-03-22 NOTE — TOC Progression Note (Addendum)
 Transition of Care [facility]) - Progression Note    Patient Details  Name: Seth Thompson MRN: [id number] Date of Birth: 1946-05-02  [facility] CM/SW Contact  Loman Risk, RN Phone Number: 03/22/2024, 9:20 AM  Clinical Narrative:     Message sent to Fredrik Jensen with DSS and Abran Abrahams with [facility] to determine if there is any update on placement   Update: Per Abran Abrahams still waiting financial piece from [employer].  Per Isreal Marinas is out of town until 6/17.   Resent out bed search to determine if there are any other placement options  Expected Discharge Plan: Skilled Nursing Facility Barriers to Discharge: Continued Medical Work up  Expected Discharge Plan and Services       Living arrangements for the past 2 months: Assisted Living Facility                                       Social Determinants of Health (SDOH) Interventions SDOH Screenings   Food Insecurity: No Food Insecurity (02/11/2024)  Housing: Low Risk  (02/11/2024)  Transportation Needs: No Transportation Needs (02/11/2024)  Utilities: Not At Risk (02/11/2024)  Social Connections: Socially Isolated (02/11/2024)  Tobacco Use: Low Risk  (02/12/2024)    Readmission Risk Interventions     No data to display

## 2024-03-22 NOTE — Progress Notes (Signed)
 Occupational Therapy Treatment Patient Details Name: Seth Thompson MRN: [id number] DOB: Apr 27, 1946 Today's Date: 03/22/2024   History of present illness Pt is a 78 year old male admitted after LOC in shower, admitted with acute metabolic encephalopathy, syncope in the setting of bradycardia, hypokalemia, incarcerated right sided inguinal hernia. PMH significant for essential hypertension, dementia, frequent PVCs, and hyperlipidemia.   OT comments  Pt seen for OT Tx. Pt asleep upon OT's arrival, wakes with gentle verbal cues. Pleasant and agreeable. Pt required MOD-MAX multimodal cues (simple, repeated) to support sequencing and initiation of both bed mobility, transfers, and seated grooming task. Initially poor balance improving with time and MIN VC/TC. MOD-MAX A for bed mobility, MOD A for STS, and MIN A for SPT to recliner. Pt continues to demonstrate progress and continues to benefit.      If plan is discharge home, recommend the following:  Supervision due to cognitive status;A lot of help with bathing/dressing/bathroom;Direct supervision/assist for medications management;Direct supervision/assist for financial management;Assist for transportation;Assistance with cooking/housework;Help with stairs or ramp for entrance;Assistance with feeding;A lot of help with walking and/or transfers   Equipment Recommendations  Wheelchair (measurements OT);Wheelchair cushion (measurements OT)    Recommendations for Other Services      Precautions / Restrictions Precautions Precautions: Fall Recall of Precautions/Restrictions: Impaired Restrictions Weight Bearing Restrictions Per Provider Order: No       Mobility Bed Mobility Overal bed mobility: Needs Assistance Bed Mobility: Supine to Sit     Supine to sit: Mod assist, HOB elevated, Max assist     General bed mobility comments: heavy posterior lean initially    Transfers Overall transfer level: Needs assistance Equipment used: 1  person hand held assist Transfers: Sit to/from Stand, Bed to chair/wheelchair/BSC Sit to Stand: Mod assist     Step pivot transfers: Min assist     General transfer comment: handheld assist, simple VC to initiate/sequence     Balance Overall balance assessment: Needs assistance Sitting-balance support: Feet supported, No upper extremity supported, Bilateral upper extremity supported, Feet unsupported Sitting balance-Leahy Scale: Poor Sitting balance - Comments: initial poor balance leaning posteriorly and having difficulty time maintaining enough hip flexion to keep feet on the ground in sitting, improving with time/cues Postural control: Posterior lean Standing balance support: Bilateral upper extremity supported Standing balance-Leahy Scale: Poor       ADL either performed or assessed with clinical judgement   ADL Overall ADL's : Needs assistance/impaired     Grooming: Sitting;Wash/dry face;Maximal assistance;Moderate assistance Grooming Details (indicate cue type and reason): visual demo paired with simple verbal cue provided to wash face, requiring hand over hand assistance to initiate and complete washing face.           Communication Communication Communication: Impaired Factors Affecting Communication: Difficulty expressing self   Cognition Arousal: Alert Behavior During Therapy: WFL for tasks assessed/performed Cognition: History of cognitive impairments             OT - Cognition Comments: pleasant, difficulty following instructions                 Following commands: Impaired Following commands impaired: Follows one step commands inconsistently      Cueing   Cueing Techniques: Verbal cues, Gestural cues, Tactile cues, Visual cues             Pertinent Vitals/ Pain       Pain Assessment Pain Assessment: Faces Faces Pain Scale: No hurt   Frequency  Min 1X/week  Progress Toward Goals  OT Goals(current goals can now be found  in the care plan section)  Progress towards OT goals: Progressing toward goals  Acute Rehab OT Goals OT Goal Formulation: Patient unable to participate in goal setting Time For Goal Achievement: 03/26/24 Potential to Achieve Goals: Fair         AM-PAC OT 6 Clicks Daily Activity     Outcome Measure   Help from another person eating meals?: A Little Help from another person taking care of personal grooming?: A Lot Help from another person toileting, which includes using toliet, bedpan, or urinal?: A Lot Help from another person bathing (including washing, rinsing, drying)?: A Lot Help from another person to put on and taking off regular upper body clothing?: A Lot Help from another person to put on and taking off regular lower body clothing?: A Lot 6 Click Score: 13    End of Session    OT Visit Diagnosis: Other abnormalities of gait and mobility (R26.89);Cognitive communication deficit (R41.841);Other symptoms and signs involving cognitive function Symptoms and signs involving cognitive functions: Other cerebrovascular disease   Activity Tolerance Patient tolerated treatment well   Patient Left in chair;with call bell/phone within reach;with chair alarm set   Nurse Communication          Time: 1610-9604 OT Time Calculation (min): 17 min  Charges: OT General Charges $OT Visit: 1 Visit OT Treatments $Self Care/Home Management : 8-22 mins  Berenda Breaker., MPH, MS, OTR/L ascom [phone] 03/22/24, 1:03 PM

## 2024-03-23 DIAGNOSIS — R001 Bradycardia, unspecified: Secondary | ICD-10-CM | POA: Diagnosis not present

## 2024-03-23 NOTE — Plan of Care (Signed)

## 2024-03-23 NOTE — Progress Notes (Signed)
 Physical Therapy Treatment Patient Details Name: Seth Thompson MRN: [id number] DOB: 10-08-1945 Today's Date: 03/23/2024   History of Present Illness Pt is a 78 year old male admitted after LOC in shower, admitted with acute metabolic encephalopathy, syncope in the setting of bradycardia, hypokalemia, incarcerated right sided inguinal hernia. PMH significant for essential hypertension, dementia, frequent PVCs, and hyperlipidemia.    PT Comments  Pt seen for PT tx with pt received in bed. Pt is pleasant throughout session, follows simple commands with multimodal cuing & extra time. Pt is limited by cognitive deficits more so than physical deficits. Pt is able to ambulate around unit with HHA with min assist with impaired gait pattern as noted below. At beginning of session pt was noted to be incontinent of BM & required total assist for peri hygiene. Will continue to follow pt acutely.    If plan is discharge home, recommend the following: Assistance with cooking/housework;Assistance with feeding;Help with stairs or ramp for entrance;A lot of help with bathing/dressing/bathroom;A lot of help with walking and/or transfers   Can travel by private vehicle     Yes  Equipment Recommendations  None recommended by PT    Recommendations for Other Services       Precautions / Restrictions Precautions Precautions: Fall Restrictions Weight Bearing Restrictions Per Provider Order: No     Mobility  Bed Mobility Overal bed mobility: Needs Assistance Bed Mobility: Supine to Sit     Supine to sit: Max assist, Used rails, HOB elevated (pt unable to motor plan to figure out how to come to sitting EOB; more limited by cognition than physical deficits)          Transfers Overall transfer level: Needs assistance Equipment used: 1 person hand held assist Transfers: Sit to/from Stand Sit to Stand: Mod assist (poor initiation, limited by impaired cognition vs physical deficits)                 Ambulation/Gait Ambulation/Gait assistance: Min assist Gait Distance (Feet): 170 Feet Assistive device: 1 person hand held assist Gait Pattern/deviations: Step-through pattern, Decreased step length - right, Decreased step length - left, Decreased dorsiflexion - right, Decreased dorsiflexion - left Gait velocity: decreased     General Gait Details: decreased heel strike BLE   Stairs             Wheelchair Mobility     Tilt Bed    Modified Rankin (Stroke Patients Only)       Balance Overall balance assessment: Needs assistance Sitting-balance support: Feet supported, No upper extremity supported, Bilateral upper extremity supported, Feet unsupported Sitting balance-Leahy Scale: Fair Sitting balance - Comments: supervision static sitting EOB   Standing balance support: Single extremity supported, During functional activity Standing balance-Leahy Scale: Fair Standing balance comment: poor progressing to fair standing balance                            Communication Communication Communication: Impaired Factors Affecting Communication: Difficulty expressing self  Cognition Arousal: Alert Behavior During Therapy: WFL for tasks assessed/performed   PT - Cognitive impairments: History of cognitive impairments                           Following commands impaired: Follows one step commands inconsistently, Follows one step commands with increased time    Cueing Cueing Techniques: Verbal cues, Gestural cues, Tactile cues, Visual cues  Exercises  General Comments        Pertinent Vitals/Pain Pain Assessment Pain Assessment: Faces Faces Pain Scale: No hurt    Home Living                          Prior Function            PT Goals (current goals can now be found in the care plan section) Acute Rehab PT Goals PT Goal Formulation: Patient unable to participate in goal setting Time For Goal Achievement:  03/29/24 Potential to Achieve Goals: Fair Progress towards PT goals: Progressing toward goals    Frequency    Min 1X/week      PT Plan      Co-evaluation              AM-PAC PT 6 Clicks Mobility   Outcome Measure  Help needed turning from your back to your side while in a flat bed without using bedrails?: A Little Help needed moving from lying on your back to sitting on the side of a flat bed without using bedrails?: A Lot Help needed moving to and from a bed to a chair (including a wheelchair)?: A Little Help needed standing up from a chair using your arms (e.g., wheelchair or bedside chair)?: A Lot Help needed to walk in hospital room?: A Little Help needed climbing 3-5 steps with a railing? : A Lot 6 Click Score: 15    End of Session   Activity Tolerance: Patient tolerated treatment well Patient left: in chair;with call bell/phone within reach;with chair alarm set (telesitter in room) Nurse Communication: Mobility status PT Visit Diagnosis: Unsteadiness on feet (R26.81);Difficulty in walking, not elsewhere classified (R26.2);Muscle weakness (generalized) (M62.81)     Time: 1610-9604 PT Time Calculation (min) (ACUTE ONLY): 17 min  Charges:    $Therapeutic Activity: 8-22 mins PT General Charges $$ ACUTE PT VISIT: 1 Visit                     Emaline Handsome, PT, DPT 03/23/24, 12:21 PM   Venetta Gill 03/23/2024, 12:20 PM

## 2024-03-23 NOTE — Progress Notes (Signed)
 Mobility Specialist - Progress Note   03/23/24 1447  Mobility  Activity Ambulated with assistance in hallway  Level of Assistance Minimal assist, patient does 75% or more  Assistive Device None (HHA)  Distance Ambulated (ft) 160 ft  Activity Response Tolerated well  Mobility visit 1 Mobility  Mobility Specialist Start Time (ACUTE ONLY) 1419  Mobility Specialist Stop Time (ACUTE ONLY) 1443  Mobility Specialist Time Calculation (min) (ACUTE ONLY) 24 min   Pt sitting in the recliner, utilizing RA. Pt STS MinA, amb one lap around the NS with HHA of the RUE--- min cuing for redirection to task. Pt returned to the room, left supine with alarm set and needs within reach.  Versa Gore Mobility Specialist 03/23/24 2:57 PM

## 2024-03-23 NOTE — Progress Notes (Signed)
 PROGRESS NOTE    Seth Thompson   ZOX:[id number] DOB: [birth year]  DOA: 02/11/2024 Date of Service: 03/23/24 which is hospital day 41  PCP: [facility] course / significant events:  Seth Thompson is a 78 y.o. male with medical history significant of essential hypertension, dementia, frequent PVCs, hyperlipidemia who was brought in from a facility after the patient lost consciousness while sitting on the shower chair at the facility.  He is admitted for further management and evaluation of altered mental status. Tx pneumonia, also severe bradycardia likely cause for syncope, but not good candidate for pacer. Upon arrival to the emergency room patient was found to be bradycardic in the 50s and had a drop of his pulse into the 20s.Echo showed EF 60 to 65% with normal diastolic parameters.Telemetry unremarkable. Currently financials are holding up dc to facility, pt is not safe to live alone      Consultants:  Cardiology General surgery  Palliative care  Procedures/Surgeries: none      ASSESSMENT & PLAN:   Acute metabolic encephalopathy on chronic cognitive impairment - now at baseline Severe dementia with behavioral disturbances Sun-downing seroquel  50 mg nightly Haldol  IM PRN  Syncope in the setting of bradycardia-improved Avoid AV nodal blocking agents. Cardiologist advised no intervention of permanent pacemaker at this time.  Community-acquired pneumonia - resolved received Augmentin  for total 5 days. Recheck as needed  Hypokalemia Replace as needed Monitor BMP periodically   Incarcerated right sided inguinal hernia No surgical intervention recommended by surgery team. Continue as needed pain medication   Essential hypertension, cont amlodipine  and losartan    Hyperlipidemia No statin    overweight based on BMI: Body mass index is 25.02 kg/m.Seth Thompson Significantly low or high BMI is associated with higher medical risk.   Underweight - under 18  overweight - 25 to 29 obese - 30 or more Class 1 obesity: BMI of 30.0 to 34 Class 2 obesity: BMI of 35.0 to 39 Class 3 obesity: BMI of 40.0 to 49 Super Morbid Obesity: BMI 50-59 Super-super Morbid Obesity: BMI 60+ Healthy nutrition and physical activity advised as adjunct to other disease management and risk reduction treatments    DVT prophylaxis: lovenox  IV fluids: no continuous IV fluids  Nutrition: regular Central lines / other devices: none  Code Status: DNR ACP documentation reviewed: has DNR order on file in [facility]  [facility] needs: placement Medical barriers to dispo: none.             Subjective / Brief ROS:  Patient reports no concerns Minimally verbal  Denies CP/SOB.     Family Communication: none at this time     Objective Findings:  Vitals:   03/23/24 0414 03/23/24 0724 03/23/24 0725 03/23/24 1022  BP: (!) 149/68 (!) 120/104    Pulse: (!) 41 (!) 50  60  Resp: 18 16    Temp:   (!) 97.4 F (36.3 C)   TempSrc:   Oral   SpO2:  100%    Weight:      Height:        Intake/Output Summary (Last 24 hours) at 03/23/2024 1253 Last data filed at 03/23/2024 0900 Gross per 24 hour  Intake 240 ml  Output 1250 ml  Net -1010 ml   Filed Weights   02/11/24 8119 02/17/24 0405 02/20/24 2100  Weight: 89.9 kg 78.3 kg 79.1 kg    Examination:  Physical Exam Constitutional:      General: He is not  in acute distress.    Appearance: He is not ill-appearing.   Cardiovascular:     Rate and Rhythm: Normal rate and regular rhythm.  Pulmonary:     Effort: Pulmonary effort is normal.     Breath sounds: Normal breath sounds.  Genitourinary:    Comments: Stable inguinal hernia  Musculoskeletal:     Right lower leg: No edema.     Left lower leg: No edema.   Skin:    General: Skin is warm and dry.   Neurological:     Mental Status: He is alert. Mental status is at baseline. He is disoriented.   Psychiatric:        Mood and  Affect: Mood normal.        Behavior: Behavior normal.          Scheduled Medications:   amLODipine   5 mg Oral Daily   enoxaparin  (LOVENOX ) injection  40 mg Subcutaneous Q24H   feeding supplement  237 mL Oral BID BM   losartan   25 mg Oral Daily   melatonin  5 mg Oral QHS   QUEtiapine   50 mg Oral QHS   senna  1 tablet Oral Once per day on Monday Wednesday Friday   sertraline   50 mg Oral QHS    Continuous Infusions:   PRN Medications:  guaiFENesin -dextromethorphan, haloperidol  lactate, hydrALAZINE , lactulose , polyethylene glycol  Antimicrobials from admission:  Anti-infectives (From admission, onward)    Start     Dose/Rate Route Frequency Ordered Stop   02/16/24 1530  amoxicillin -clavulanate (AUGMENTIN ) 875-125 MG per tablet 1 tablet        1 tablet Oral Every 12 hours 02/16/24 1435 02/20/24 2043   02/16/24 1500  cephALEXin  (KEFLEX ) capsule 500 mg  Status:  Discontinued        500 mg Oral 2 times daily 02/16/24 1406 02/16/24 1435   02/16/24 1300  cefTRIAXone  (ROCEPHIN ) 2 g in sodium chloride  0.9 % 100 mL IVPB  Status:  Discontinued        2 g 200 mL/hr over 30 Minutes Intravenous Every 24 hours 02/16/24 1221 02/16/24 1406   02/16/24 1300  azithromycin  (ZITHROMAX ) 500 mg in sodium chloride  0.9 % 250 mL IVPB  Status:  Discontinued        500 mg 250 mL/hr over 60 Minutes Intravenous Every 24 hours 02/16/24 1221 02/16/24 1406           Data Reviewed:  I have personally reviewed the following...  CBC: Recent Labs  Lab 03/21/24 0513  WBC 5.7  HGB 15.2  HCT 44.3  MCV 90.4  PLT 152   Basic Metabolic Panel: Recent Labs  Lab 03/17/24 0451 03/21/24 0513  NA 138 139  K 3.9 4.0  CL 106 105  CO2 26 27  GLUCOSE 113* 108*  BUN 27* 25*  CREATININE 0.87 1.01  CALCIUM 9.0 9.1   GFR: Estimated Creatinine Clearance: 63.2 mL/min (by C-G formula based on SCr of 1.01 mg/dL). Liver Function Tests: No results for input(s): AST, ALT, ALKPHOS, BILITOT,  PROT, ALBUMIN in the last 168 hours. No results for input(s): LIPASE, AMYLASE in the last 168 hours. No results for input(s): AMMONIA in the last 168 hours. Coagulation Profile: No results for input(s): INR, PROTIME in the last 168 hours. Cardiac Enzymes: No results for input(s): CKTOTAL, CKMB, CKMBINDEX, TROPONINI in the last 168 hours. BNP (last 3 results) No results for input(s): PROBNP in the last 8760 hours. HbA1C: No results for input(s): HGBA1C in the last 72  hours. CBG: No results for input(s): GLUCAP in the last 168 hours. Lipid Profile: No results for input(s): CHOL, HDL, LDLCALC, TRIG, CHOLHDL, LDLDIRECT in the last 72 hours. Thyroid  Function Tests: No results for input(s): TSH, T4TOTAL, FREET4, T3FREE, THYROIDAB in the last 72 hours. Anemia Panel: No results for input(s): VITAMINB12, FOLATE, FERRITIN, TIBC, IRON, RETICCTPCT in the last 72 hours. Most Recent Urinalysis On File:     Component Value Date/Time   COLORURINE YELLOW (A) 02/11/2024 0638   APPEARANCEUR CLEAR (A) 02/11/2024 0638   APPEARANCEUR Clear 05/06/2013 2002   LABSPEC 1.008 02/11/2024 0638   LABSPEC 1.017 05/06/2013 2002   PHURINE 6.0 02/11/2024 0638   GLUCOSEU NEGATIVE 02/11/2024 0638   GLUCOSEU Negative 05/06/2013 2002   HGBUR SMALL (A) 02/11/2024 0638   BILIRUBINUR NEGATIVE 02/11/2024 0638   BILIRUBINUR Negative 05/06/2013 2002   KETONESUR NEGATIVE 02/11/2024 0638   PROTEINUR NEGATIVE 02/11/2024 0638   NITRITE NEGATIVE 02/11/2024 0638   LEUKOCYTESUR NEGATIVE 02/11/2024 0638   LEUKOCYTESUR Negative 05/06/2013 2002   Sepsis Labs: @LABRCNTIP (procalcitonin:4,lacticidven:4) Microbiology: No results found for this or any previous visit (from the past 240 hours).    Radiology Studies last 3 days: No results found.        Judson Tsan, DO [facility] 03/23/2024, 12:53 PM    Dictation software may have been used to  generate the above note. Typos may occur and escape review in typed/dictated notes. Please contact Dr Authur Leghorn directly for clarity if needed.  Staff may message me via secure chat in Epic  but this may not receive an immediate response,  please page me for urgent matters!  If 7PM-7AM, please contact night coverage [email or web address]

## 2024-03-23 NOTE — TOC Progression Note (Signed)
 Transition of Care [facility]) - Progression Note    Patient Details  Name: Seth Thompson MRN: [id number] Date of Birth: 07/29/1946  Transition of Care [facility]) CM/SW Contact  Loman Risk, RN Phone Number: 03/23/2024, 9:13 AM  Clinical Narrative:     Per Fredrik Jensen with DSS, Tammy at [facility] requesting clinical. Clinical resent    Expected Discharge Plan: Skilled Nursing Facility Barriers to Discharge: Continued Medical Work up  Expected Discharge Plan and Services       Living arrangements for the past 2 months: Assisted Living Facility                                       Social Determinants of Health (SDOH) Interventions SDOH Screenings   Food Insecurity: No Food Insecurity (02/11/2024)  Housing: Low Risk  (02/11/2024)  Transportation Needs: No Transportation Needs (02/11/2024)  Utilities: Not At Risk (02/11/2024)  Social Connections: Socially Isolated (02/11/2024)  Tobacco Use: Low Risk  (02/12/2024)    Readmission Risk Interventions     No data to display

## 2024-03-24 DIAGNOSIS — R001 Bradycardia, unspecified: Secondary | ICD-10-CM | POA: Diagnosis not present

## 2024-03-24 DIAGNOSIS — R55 Syncope and collapse: Secondary | ICD-10-CM | POA: Diagnosis not present

## 2024-03-24 DIAGNOSIS — F0280 Dementia in other diseases classified elsewhere without behavioral disturbance: Secondary | ICD-10-CM | POA: Diagnosis not present

## 2024-03-24 DIAGNOSIS — G309 Alzheimer's disease, unspecified: Secondary | ICD-10-CM | POA: Diagnosis not present

## 2024-03-24 NOTE — Progress Notes (Addendum)
 PT Cancellation Note  Patient Details Name: Seth Thompson MRN: [id number] DOB: October 03, 1946   Cancelled Treatment:    Reason Eval/Treat Not Completed: Patient's level of consciousness Pt rambling incoherently from PT's arrival to room until leaving.  Pt struggled to have any meaningful interaction with PT apart from making good eye contact  during his continued rambling.  Pt did seem to be close to working toward the EOB with excessive cuing/encouragement but continued to pull at suction tube, phone cord and anything else he could get his hands on.  He had no awareness that he was soiled with a BM, nursing called to assist with clean up. Pt appearing too confused and impulsive to safely participate with PT this afternoon.  Of note he was able to work with OT earlier today as well as do a prolonged bout of ambulation with mobility tech.  Darice Edelman 03/24/2024, 5:43 PM

## 2024-03-24 NOTE — Plan of Care (Signed)

## 2024-03-24 NOTE — Progress Notes (Signed)
 Mobility Specialist - Progress Note   03/24/24 1050  Mobility  Activity Ambulated with assistance in hallway;Stood at bedside  Level of Assistance Minimal assist, patient does 75% or more  Assistive Device None  Distance Ambulated (ft) 160 ft  Activity Response Tolerated well  Mobility visit 1 Mobility  Mobility Specialist Start Time (ACUTE ONLY) 1021  Mobility Specialist Stop Time (ACUTE ONLY) 1044  Mobility Specialist Time Calculation (min) (ACUTE ONLY) 23 min   Pt supine upon entry, utilizing RA. Pt completed bed mob ModA, able to self support trunk once seated EOB. Pt STS from EOB MinA, BM noted. Pt stood at bedside for ~ 2 mins while MS completed peri care MaxA. Pt amb one lap around the NS with MinA-HHA of the RUE, tolerated well. Pt returned to the room, left seated in the recliner with alarm set and needs within reach.  Versa Gore Mobility Specialist 03/24/24 11:03 AM

## 2024-03-24 NOTE — Progress Notes (Signed)
 PROGRESS NOTE Seth Thompson    DOB: [birth year], 78 y.o.  YNW:[id number]    Code Status: Limited: Do not attempt resuscitation (DNR) -DNR-LIMITED -Do Not Intubate/DNI    DOA: 02/11/2024   LOS: 42  Brief hospital course  Seth Thompson is a 78 y.o. male with medical history significant of essential hypertension, dementia, frequent PVCs, hyperlipidemia who was brought in from a facility after the patient lost consciousness. Tx pneumonia, also severe bradycardia likely cause for syncope, but not good candidate for pacer. Echo showed EF 60 to 65% with normal diastolic parameters.Telemetry unremarkable. Patient is currently stable for discharge pending safe dispo plan.    Assessment & Plan  Principal Problem:   Bradycardia with less than 30 beats per minute Active Problems:   Incarcerated right inguinal hernia   Syncope   Alzheimer's disease (HCC)  Acute metabolic encephalopathy on chronic cognitive impairment - now at baseline Severe dementia with behavioral disturbances. No behavior disturbances for over a week, per report Sun-downing seroquel  50 mg nightly Haldol  IM PRN   Syncope in the setting of bradycardia-improved. HR 50-80 Avoid AV nodal blocking agents. Cardiologist advised no intervention of permanent pacemaker at this time.   Community-acquired pneumonia - resolved received Augmentin  for total 5 days. Recheck as needed   Hypokalemia Replace as needed Monitor BMP periodically   Incarcerated right sided inguinal hernia No surgical intervention recommended by surgery team. Continue as needed pain medication   Essential hypertension, cont amlodipine  and losartan    Hyperlipidemia No statin  Body mass index is 25.02 kg/m.  VTE ppx: enoxaparin  (LOVENOX ) injection 40 mg Start: 02/11/24 2200  Diet:     Diet   Diet regular Room service appropriate? Yes; Fluid consistency: Thin   Consultants: Surgery Cardiology   Subjective 03/24/24    Pt reports no  complaints. Eating breakfast and not otherwise oriented.    Objective  Blood pressure (!) 147/65, pulse 66, temperature (!) 97.4 F (36.3 C), temperature source Oral, resp. rate 20, height 5' 10 (1.778 m), weight 79.1 kg, SpO2 100%.  Intake/Output Summary (Last 24 hours) at 03/24/2024 0720 Last data filed at 03/23/2024 2045 Gross per 24 hour  Intake 600 ml  Output 1400 ml  Net -800 ml   Filed Weights   02/11/24 0633 02/17/24 0405 02/20/24 2100  Weight: 89.9 kg 78.3 kg 79.1 kg     Physical Exam:  General: awake, alert, NAD HEENT: atraumatic, clear conjunctiva, anicteric sclera, MMM, hearing grossly normal Respiratory: normal respiratory effort. Cardiovascular: quick capillary refill, normal S1/S2, RRR, no JVD, murmurs Gastrointestinal: soft, NT, ND Nervous: A&O xself. no gross focal neurologic deficits, normal speech Extremities: moves all equally, no edema, normal tone Skin: dry, intact, normal temperature, normal color. No rashes, lesions or ulcers on exposed skin Psychiatry: normal mood, congruent affect  Labs   I have personally reviewed the following labs and imaging studies CBC    Component Value Date/Time   WBC 5.7 03/21/2024 0513   RBC 4.90 03/21/2024 0513   HGB 15.2 03/21/2024 0513   HGB 16.5 05/06/2013 1942   HCT 44.3 03/21/2024 0513   HCT 46.7 05/06/2013 1942   PLT 152 03/21/2024 0513   PLT 129 (L) 05/06/2013 1942   MCV 90.4 03/21/2024 0513   MCV 92 05/06/2013 1942   MCH 31.0 03/21/2024 0513   MCHC 34.3 03/21/2024 0513   RDW 12.6 03/21/2024 0513   RDW 13.2 05/06/2013 1942   LYMPHSABS 0.9 02/18/2024 0514   MONOABS 0.5 02/18/2024 0514  EOSABS 0.1 02/18/2024 0514   BASOSABS 0.0 02/18/2024 0514      Latest Ref Rng & Units 03/21/2024    5:13 AM 03/17/2024    4:51 AM 03/09/2024    4:32 AM  BMP  Glucose 70 - 99 mg/dL 161  096    BUN 8 - 23 mg/dL 25  27    Creatinine 0.45 - 1.24 mg/dL 4.09  8.11  9.14   Sodium 135 - 145 mmol/L 139  138    Potassium 3.5 -  5.1 mmol/L 4.0  3.9    Chloride 98 - 111 mmol/L 105  106    CO2 22 - 32 mmol/L 27  26    Calcium 8.9 - 10.3 mg/dL 9.1  9.0      No results found.  Disposition Plan & Communication  Patient status: Inpatient  Admitted From: Home Planned disposition location: Skilled nursing facility Anticipated discharge date: unknown pending placement  Family Communication: none at bedside    Author: Ree Candy, DO [facility] 03/24/2024, 7:20 AM   Available by Epic secure chat 7AM-7PM. If 7PM-7AM, please contact night-coverage.  [facility] contact information found on [email or web address].

## 2024-03-24 NOTE — Progress Notes (Signed)
 Occupational Therapy Treatment Patient Details Name: Seth Thompson MRN: [id number] DOB: [birth year] Today's Date: 03/24/2024   History of present illness Pt is a 78 year old male admitted after LOC in shower, admitted with acute metabolic encephalopathy, syncope in the setting of bradycardia, hypokalemia, incarcerated right sided inguinal hernia. PMH significant for essential hypertension, dementia, frequent PVCs, and hyperlipidemia.   OT comments  Pt seen today for OT tx; POC and goals updated to reflect progress. Pt continues to be limited in efficient ADL performance by cognition, is pleasant and agreeable but often has a hard time comprehending + following instructions. Upon standing from recliner, noted to have incontinent BM and soiled gown. MAX A for pericare in standing and to don fresh gown prior to completing hallway mobility with +1 HHA. Small, shuffled gait pattern without LOB. Pt will require +1 assist at next LOC due to cognitive deficits with dementia hx.       If plan is discharge home, recommend the following:  Supervision due to cognitive status;A lot of help with bathing/dressing/bathroom;Direct supervision/assist for medications management;Direct supervision/assist for financial management;Assist for transportation;Assistance with cooking/housework;Help with stairs or ramp for entrance;Assistance with feeding;A lot of help with walking and/or transfers   Equipment Recommendations  Wheelchair (measurements OT);Wheelchair cushion (measurements OT)       Precautions / Restrictions Precautions Precautions: Fall Recall of Precautions/Restrictions: Impaired Precaution/Restrictions Comments: dementia, pleasant and cooperative Restrictions Weight Bearing Restrictions Per Provider Order: No       Mobility Bed Mobility Overal bed mobility: Needs Assistance             General bed mobility comments: NT, in recliner pre and post session    Transfers Overall transfer  level: Needs assistance Equipment used: 1 person hand held assist Transfers: Sit to/from Stand Sit to Stand: Min assist           General transfer comment: minA to rise from recliner, more cogniton related vs physical ability     Balance Overall balance assessment: Needs assistance Sitting-balance support: Feet supported, No upper extremity supported, Bilateral upper extremity supported, Feet unsupported Sitting balance-Leahy Scale: Fair Sitting balance - Comments: supervision static sitting EOB   Standing balance support: Single extremity supported, During functional activity Standing balance-Leahy Scale: Fair                             ADL either performed or assessed with clinical judgement   ADL Overall ADL's : Needs assistance/impaired     Grooming: Sitting;Maximal assistance Grooming Details (indicate cue type and reason): pt interally distracted, whistling and making bird noises. visual demo and max multimodal cues to wash face with pt unable to sustain task attn         Upper Body Dressing : Maximal assistance;Sitting Upper Body Dressing Details (indicate cue type and reason): to doff and don hospital gown seated in recliner         Toileting- Clothing Manipulation and Hygiene: Sit to/from stand;Maximal assistance Toileting - Clothing Manipulation Details (indicate cue type and reason): upon standing, pt noted to have incontinent BM. MAX A for pericare while standing.     Functional mobility during ADLs: Cueing for safety;Cueing for sequencing;Contact guard assist General ADL Comments: Functional mobility 200 ft in hallway, CGA with +1 HHA, pt waving at people and wanders, has a hard time following directions to get back to room but very pleasant. cognition limits mobility and ADL performance  Communication Communication Communication: Impaired Factors Affecting Communication: Difficulty expressing self   Cognition Arousal: Alert Behavior  During Therapy: WFL for tasks assessed/performed Cognition: History of cognitive impairments   Orientation impairments: Place, Time, Situation Awareness: Intellectual awareness impaired, Online awareness impaired Memory impairment (select all impairments): Short-term memory, Working [employer] fast streamer, Non-declarative long-term memory, Geneticist, molecular long-term memory Attention impairment (select first level of impairment): Focused attention Executive functioning impairment (select all impairments): Initiation, Organization, Sequencing, Reasoning, Problem solving OT - Cognition Comments: pleasant, difficulty following instructions, likes to wander                 Following commands: Impaired Following commands impaired: Follows one step commands inconsistently, Follows one step commands with increased time      Cueing   Cueing Techniques: Verbal cues, Gestural cues, Tactile cues, Visual cues             Pertinent Vitals/ Pain       Pain Assessment Pain Assessment: No/denies pain Pain Score: 0-No pain   Frequency  Min 1X/week        Progress Toward Goals  OT Goals(current goals can now be found in the care plan section)  Progress towards OT goals: Progressing toward goals  ADL Goals Pt Will Perform Grooming: with supervision;sitting;standing Pt Will Perform Lower Body Dressing: with min assist;sitting/lateral leans;sit to/from stand Pt Will Transfer to Toilet: with min assist;ambulating;regular height toilet;bedside commode Pt Will Perform Toileting - Clothing Manipulation and hygiene: with min assist;sit to/from stand;sitting/lateral leans  Plan      AM-PAC OT 6 Clicks Daily Activity     Outcome Measure   Help from another person eating meals?: A Little Help from another person taking care of personal grooming?: A Lot Help from another person toileting, which includes using toliet, bedpan, or urinal?: A Lot Help from another person bathing (including washing, rinsing,  drying)?: A Lot Help from another person to put on and taking off regular upper body clothing?: A Lot Help from another person to put on and taking off regular lower body clothing?: A Lot 6 Click Score: 13    End of Session Equipment Utilized During Treatment: Gait belt  OT Visit Diagnosis: Other abnormalities of gait and mobility (R26.89);Cognitive communication deficit (R41.841);Other symptoms and signs involving cognitive function Symptoms and signs involving cognitive functions: Other cerebrovascular disease   Activity Tolerance Patient tolerated treatment well   Patient Left in chair;with call bell/phone within reach;with chair alarm set   Nurse Communication Mobility status        Time: 1610-9604 OT Time Calculation (min): 28 min  Charges: OT General Charges $OT Visit: 1 Visit OT Treatments $Self Care/Home Management : 8-22 mins $Therapeutic Activity: 8-22 mins Faithlynn Deeley L. Marliss Buttacavoli, OTR/L  03/24/24, 12:22 PM

## 2024-03-24 NOTE — TOC Progression Note (Signed)
 Transition of Care [facility]) - Progression Note    Patient Details  Name: OZIE LUPE MRN: [id number] Date of Birth: 07/17/1946  [facility]) CM/SW Contact  Odilia Bennett, LCSW Phone Number: 03/24/2024, 9:59 AM  Clinical Narrative:   Emailed financial counselor to see if she can pull up patient's traditional Medicare number.  Expected Discharge Plan: Skilled Nursing Facility Barriers to Discharge: Continued Medical Work up  Expected Discharge Plan and Services       Living arrangements for the past 2 months: Assisted Living Facility                                       Social Determinants of Health (SDOH) Interventions SDOH Screenings   Food Insecurity: No Food Insecurity (02/11/2024)  Housing: Low Risk  (02/11/2024)  Transportation Needs: No Transportation Needs (02/11/2024)  Utilities: Not At Risk (02/11/2024)  Social Connections: Socially Isolated (02/11/2024)  Tobacco Use: Low Risk  (02/12/2024)    Readmission Risk Interventions     No data to display

## 2024-03-25 DIAGNOSIS — G309 Alzheimer's disease, unspecified: Secondary | ICD-10-CM | POA: Diagnosis not present

## 2024-03-25 DIAGNOSIS — R55 Syncope and collapse: Secondary | ICD-10-CM | POA: Diagnosis not present

## 2024-03-25 DIAGNOSIS — R001 Bradycardia, unspecified: Secondary | ICD-10-CM | POA: Diagnosis not present

## 2024-03-25 DIAGNOSIS — F0280 Dementia in other diseases classified elsewhere without behavioral disturbance: Secondary | ICD-10-CM | POA: Diagnosis not present

## 2024-03-25 NOTE — TOC Progression Note (Signed)
 Transition of Care [facility]) - Progression Note    Patient Details  Name: HASAN DOUSE MRN: [id number] Date of Birth: 07-12-1946  Transition of Care [facility]) CM/SW Contact  Loman Risk, RN Phone Number: 03/25/2024, 8:57 AM  Clinical Narrative:     Per Tammy at [facility] they can offer a bed but need patient medicare ID in order run benefits. Fredrik Jensen with DSS working to find patient's ID.  Kayleen Party with financial also to check for ID number   Expected Discharge Plan: Skilled Nursing Facility Barriers to Discharge: Continued Medical Work up  Expected Discharge Plan and Services       Living arrangements for the past 2 months: Assisted Living Facility                                       Social Determinants of Health (SDOH) Interventions SDOH Screenings   Food Insecurity: No Food Insecurity (02/11/2024)  Housing: Low Risk  (02/11/2024)  Transportation Needs: No Transportation Needs (02/11/2024)  Utilities: Not At Risk (02/11/2024)  Social Connections: Socially Isolated (02/11/2024)  Tobacco Use: Low Risk  (02/12/2024)    Readmission Risk Interventions     No data to display

## 2024-03-25 NOTE — Progress Notes (Signed)
 PROGRESS NOTE Seth Thompson    DOB: [birth year], 78 y.o.  [id number]    Code Status: Limited: Do not attempt resuscitation (DNR) -DNR-LIMITED -Do Not Intubate/DNI    DOA: 02/11/2024   LOS: 43  Brief hospital course  Seth Thompson is a 78 y.o. male with medical history significant of essential hypertension, dementia, frequent PVCs, hyperlipidemia who was brought in from a facility after the patient lost consciousness. Tx pneumonia, also severe bradycardia likely cause for syncope, but not good candidate for pacer. Echo showed EF 60 to 65% with normal diastolic parameters.Telemetry unremarkable. Patient is currently stable for discharge pending safe dispo plan.    Assessment & Plan  Principal Problem:   Bradycardia with less than 30 beats per minute Active Problems:   Incarcerated right inguinal hernia   Syncope   Alzheimer's disease (HCC)  Acute metabolic encephalopathy on chronic cognitive impairment - now at baseline Severe dementia with behavioral disturbances. No behavior disturbances for over a week, per report Sun-downing seroquel  50 mg nightly Haldol  IM PRN   Syncope in the setting of bradycardia-improved. HR 50-80 Avoid AV nodal blocking agents. Cardiologist advised no intervention of permanent pacemaker at this time.   Community-acquired pneumonia - resolved. ORA received Augmentin  for total 5 days.   Hypokalemia Replace as needed Monitor BMP periodically   Incarcerated right sided inguinal hernia No surgical intervention recommended by surgery team. Continue as needed pain medication   Essential hypertension, cont amlodipine  and losartan    Hyperlipidemia No statin  Body mass index is 25.02 kg/m.  VTE ppx: enoxaparin  (LOVENOX ) injection 40 mg Start: 02/11/24 2200  Diet:     Diet   Diet regular Room service appropriate? Yes; Fluid consistency: Thin   Consultants: Surgery Cardiology   Subjective 03/25/24    Pt reports no complaints. Eating  breakfast and not otherwise oriented.    Objective  Blood pressure (!) 147/65, pulse 66, temperature (!) 97.4 F (36.3 C), temperature source Oral, resp. rate 20, height 5' 10 (1.778 m), weight 79.1 kg, SpO2 100%.  Intake/Output Summary (Last 24 hours) at 03/25/2024 0704 Last data filed at 03/24/2024 2052 Gross per 24 hour  Intake 240 ml  Output 800 ml  Net -560 ml   Filed Weights   02/11/24 0633 02/17/24 0405 02/20/24 2100  Weight: 89.9 kg 78.3 kg 79.1 kg     Physical Exam:  General: awake, alert, NAD HEENT: atraumatic, clear conjunctiva, anicteric sclera, MMM, hearing grossly normal Respiratory: normal respiratory effort. Cardiovascular: quick capillary refill Nervous: A&O x self. no gross focal neurologic deficits, normal speech Extremities: moves all equally, no edema, normal tone Skin: dry, intact, normal temperature, normal color. No rashes, lesions or ulcers on exposed skin Psychiatry: pleasant mood  Labs   I have personally reviewed the following labs and imaging studies CBC    Component Value Date/Time   WBC 5.7 03/21/2024 0513   RBC 4.90 03/21/2024 0513   HGB 15.2 03/21/2024 0513   HGB 16.5 05/06/2013 1942   HCT 44.3 03/21/2024 0513   HCT 46.7 05/06/2013 1942   PLT 152 03/21/2024 0513   PLT 129 (L) 05/06/2013 1942   MCV 90.4 03/21/2024 0513   MCV 92 05/06/2013 1942   MCH 31.0 03/21/2024 0513   MCHC 34.3 03/21/2024 0513   RDW 12.6 03/21/2024 0513   RDW 13.2 05/06/2013 1942   LYMPHSABS 0.9 02/18/2024 0514   MONOABS 0.5 02/18/2024 0514   EOSABS 0.1 02/18/2024 0514   BASOSABS 0.0 02/18/2024 0514  Latest Ref Rng & Units 03/21/2024    5:13 AM 03/17/2024    4:51 AM 03/09/2024    4:32 AM  BMP  Glucose 70 - 99 mg/dL 161  096    BUN 8 - 23 mg/dL 25  27    Creatinine 0.45 - 1.24 mg/dL 4.09  8.11  9.14   Sodium 135 - 145 mmol/L 139  138    Potassium 3.5 - 5.1 mmol/L 4.0  3.9    Chloride 98 - 111 mmol/L 105  106    CO2 22 - 32 mmol/L 27  26    Calcium 8.9  - 10.3 mg/dL 9.1  9.0      No results found.  Disposition Plan & Communication  Patient status: Inpatient  Admitted From: Home Planned disposition location: Skilled nursing facility Anticipated discharge date: unknown pending placement  Family Communication: none at bedside    Author: Ree Candy, DO [facility] 03/25/2024, 7:04 AM   Available by Epic secure chat 7AM-7PM. If 7PM-7AM, please contact night-coverage.  TRH contact information found on [email or web address].

## 2024-03-25 NOTE — Plan of Care (Signed)

## 2024-03-26 DIAGNOSIS — R55 Syncope and collapse: Secondary | ICD-10-CM | POA: Diagnosis not present

## 2024-03-26 DIAGNOSIS — G309 Alzheimer's disease, unspecified: Secondary | ICD-10-CM | POA: Diagnosis not present

## 2024-03-26 DIAGNOSIS — F0280 Dementia in other diseases classified elsewhere without behavioral disturbance: Secondary | ICD-10-CM | POA: Diagnosis not present

## 2024-03-26 DIAGNOSIS — R001 Bradycardia, unspecified: Secondary | ICD-10-CM | POA: Diagnosis not present

## 2024-03-26 NOTE — Progress Notes (Signed)
 OT Cancellation Note  Patient Details Name: Seth Thompson MRN: [id number] DOB: December 05, 1945   Cancelled Treatment:    Reason Eval/Treat Not Completed: Other (comment). Pt walking in hall with mobility specialist. Will re-attempt OT tx at later date/time as pt is available.   Jayven Naill R., MPH, MS, OTR/L ascom [phone] 03/26/24, 3:56 PM

## 2024-03-26 NOTE — TOC Progression Note (Signed)
 Transition of Care [facility]) - Progression Note    Patient Details  Name: Seth Thompson MRN: [id number] Date of Birth: 06/06/1946  Transition of Care [facility]) CM/SW Contact  Odilia Bennett, LCSW Phone Number: 03/26/2024, 10:19 AM  Clinical Narrative:  Traditional Medicare number obtained: [id number]. Left message for [facility] admissions coordinator to notify.  Expected Discharge Plan: Skilled Nursing Facility Barriers to Discharge: Continued Medical Work up  Expected Discharge Plan and Services       Living arrangements for the past 2 months: Assisted Living Facility                                       Social Determinants of Health (SDOH) Interventions SDOH Screenings   Food Insecurity: No Food Insecurity (02/11/2024)  Housing: Low Risk  (02/11/2024)  Transportation Needs: No Transportation Needs (02/11/2024)  Utilities: Not At Risk (02/11/2024)  Social Connections: Socially Isolated (02/11/2024)  Tobacco Use: Low Risk  (02/12/2024)    Readmission Risk Interventions     No data to display

## 2024-03-26 NOTE — Progress Notes (Signed)
 PROGRESS NOTE [name]    DOB: Jun 27, 1946, 78 y.o.  [id number]:[id number]    Code Status: Limited: Do not attempt resuscitation (DNR) -DNR-LIMITED -Do Not Intubate/DNI    DOA: 02/11/2024   LOS: 44  Brief hospital course  Seth Thompson is a 78 y.o. male with medical history significant of essential hypertension, dementia, frequent PVCs, hyperlipidemia who was brought in from a facility after the patient lost consciousness. Tx pneumonia, also severe bradycardia likely cause for syncope, but not good candidate for pacer. Echo showed EF 60 to 65% with normal diastolic parameters.Telemetry unremarkable. Patient is currently stable for discharge pending safe dispo plan.    Assessment & Plan  Principal Problem:   Bradycardia with less than 30 beats per minute Active Problems:   Incarcerated right inguinal hernia   Syncope   Alzheimer's disease (HCC)  Acute metabolic encephalopathy on chronic cognitive impairment - now at baseline Severe dementia with behavioral disturbances. No behavior disturbances for over a week, per report Sun-downing seroquel  50 mg nightly Haldol  IM PRN   Syncope in the setting of bradycardia-improved. HR 50-80 Avoid AV nodal blocking agents. Cardiologist advised no intervention of permanent pacemaker at this time.   Community-acquired pneumonia - resolved. ORA received Augmentin  for total 5 days.   Hypokalemia Replace as needed Monitor BMP periodically   Incarcerated right sided inguinal hernia No surgical intervention recommended by surgery team. Continue as needed pain medication   Essential hypertension, cont amlodipine  and losartan    Hyperlipidemia No statin  Body mass index is 25.02 kg/m.  VTE ppx: enoxaparin  (LOVENOX ) injection 40 mg Start: 02/11/24 2200  Diet:     Diet   Diet regular Room service appropriate? Yes; Fluid consistency: Thin   Consultants: Surgery Cardiology   Subjective 03/26/24    Pt reports no complaints. Unable to  appropriately answer questions. Pleasantly confused.     Objective  Blood pressure (!) 147/65, pulse 66, temperature (!) 97.4 F (36.3 C), temperature source Oral, resp. rate 20, height 5' 10 (1.778 m), weight 79.1 kg, SpO2 100%.  Intake/Output Summary (Last 24 hours) at 03/26/2024 0707 Last data filed at 03/26/2024 0644 Gross per 24 hour  Intake 720 ml  Output 850 ml  Net -130 ml   Filed Weights   02/11/24 0633 02/17/24 0405 02/20/24 2100  Weight: 89.9 kg 78.3 kg 79.1 kg    Physical Exam:  General: awake, alert, NAD HEENT: atraumatic, clear conjunctiva, anicteric sclera, MMM, hearing grossly normal Respiratory: normal respiratory effort. Cardiovascular: quick capillary refill Nervous: A&O x self. no gross focal neurologic deficits, normal speech Extremities: moves all equally, no edema, normal tone Skin: dry, intact, normal temperature, normal color. No rashes, lesions or ulcers on exposed skin Psychiatry: pleasant mood  Labs   I have personally reviewed the following labs and imaging studies CBC    Component Value Date/Time   WBC 5.7 03/21/2024 0513   RBC 4.90 03/21/2024 0513   HGB 15.2 03/21/2024 0513   HGB 16.5 05/06/2013 1942   HCT 44.3 03/21/2024 0513   HCT 46.7 05/06/2013 1942   PLT 152 03/21/2024 0513   PLT 129 (L) 05/06/2013 1942   MCV 90.4 03/21/2024 0513   MCV 92 05/06/2013 1942   MCH 31.0 03/21/2024 0513   MCHC 34.3 03/21/2024 0513   RDW 12.6 03/21/2024 0513   RDW 13.2 05/06/2013 1942   LYMPHSABS 0.9 02/18/2024 0514   MONOABS 0.5 02/18/2024 0514   EOSABS 0.1 02/18/2024 0514   BASOSABS 0.0 02/18/2024 0514  Latest Ref Rng & Units 03/21/2024    5:13 AM 03/17/2024    4:51 AM 03/09/2024    4:32 AM  BMP  Glucose 70 - 99 mg/dL 161  096    BUN 8 - 23 mg/dL 25  27    Creatinine 0.45 - 1.24 mg/dL 4.09  8.11  9.14   Sodium 135 - 145 mmol/L 139  138    Potassium 3.5 - 5.1 mmol/L 4.0  3.9    Chloride 98 - 111 mmol/L 105  106    CO2 22 - 32 mmol/L 27  26     Calcium 8.9 - 10.3 mg/dL 9.1  9.0      No results found.  Disposition Plan & Communication  Patient status: Inpatient  Admitted From: Home Planned disposition location: Skilled nursing facility Anticipated discharge date: unknown pending placement  Family Communication: none at bedside    Author: Ree Candy, DO [facility] 03/26/2024, 7:07 AM   Available by Epic secure chat 7AM-7PM. If 7PM-7AM, please contact night-coverage.  TRH contact information found on [email or web address].

## 2024-03-26 NOTE — Progress Notes (Signed)
 Mobility Specialist - Progress Note   03/26/24 1600  Mobility  Activity Ambulated with assistance in hallway  Level of Assistance Minimal assist, patient does 75% or more  Assistive Device Other (Comment)  Activity Response Tolerated well  Mobility visit 1 Mobility     Pt lying in bed upon arrival, utilizing RA. Pt completed mobility with maxA. STS with modA and ambulation in hallway with minA. Increased posterior push from Bolivar Bushman this date compared to previous sessions. Slight posterior lean in standing. Follows commands intermittently. No LOB. Pt returned to bed with alarm set, needs in reach.    Searcy Czech Mobility Specialist 03/26/24, 4:26 PM

## 2024-03-27 DIAGNOSIS — R001 Bradycardia, unspecified: Secondary | ICD-10-CM | POA: Diagnosis not present

## 2024-03-27 DIAGNOSIS — R55 Syncope and collapse: Secondary | ICD-10-CM | POA: Diagnosis not present

## 2024-03-27 DIAGNOSIS — F0280 Dementia in other diseases classified elsewhere without behavioral disturbance: Secondary | ICD-10-CM | POA: Diagnosis not present

## 2024-03-27 DIAGNOSIS — G309 Alzheimer's disease, unspecified: Secondary | ICD-10-CM | POA: Diagnosis not present

## 2024-03-27 NOTE — Progress Notes (Signed)
 PROGRESS NOTE Seth Thompson    DOB: [birth year], 78 y.o.  FMW:[id number]    Code Status: Limited: Do not attempt resuscitation (DNR) -DNR-LIMITED -Do Not Intubate/DNI    DOA: 02/11/2024   LOS: 45  Brief hospital course  Seth Thompson is a 78 y.o. male with medical history significant of essential hypertension, dementia, frequent PVCs, hyperlipidemia who was brought in from a facility after the patient lost consciousness. Tx pneumonia, also severe bradycardia likely cause for syncope, but not good candidate for pacer. Echo showed EF 60 to 65% with normal diastolic parameters.Telemetry unremarkable. Patient is currently stable for discharge pending safe dispo plan.    Assessment & Plan  Principal Problem:   Bradycardia with less than 30 beats per minute Active Problems:   Incarcerated right inguinal hernia   Syncope   Alzheimer's disease (HCC)  Acute metabolic encephalopathy on chronic cognitive impairment - now at baseline Severe dementia without behavioral disturbances. No behavior disturbances for over a week, per report Sun-downing seroquel  50 mg nightly   Syncope in the setting of bradycardia-improved. HR 50-80 Avoid AV nodal blocking agents. Cardiologist advised no intervention of permanent pacemaker at this time.   Community-acquired pneumonia - resolved. ORA received Augmentin  for total 5 days.   Hypokalemia Replace as needed Monitor BMP periodically   Incarcerated right sided inguinal hernia No surgical intervention recommended by surgery team. Continue as needed pain medication   Essential hypertension, cont amlodipine  and losartan    Hyperlipidemia No statin  Body mass index is 25.02 kg/m.  VTE ppx: enoxaparin  (LOVENOX ) injection 40 mg Start: 02/11/24 2200  Diet:     Diet   Diet regular Room service appropriate? Yes; Fluid consistency: Thin   Consultants: Surgery Cardiology   Subjective 03/27/24    Pt reports no complaints. Unable to  appropriately answer questions. Pleasantly confused.     Objective  Blood pressure (!) 147/65, pulse 66, temperature (!) 97.4 F (36.3 C), temperature source Oral, resp. rate 20, height 5' 10 (1.778 m), weight 79.1 kg, SpO2 100%.  Intake/Output Summary (Last 24 hours) at 03/27/2024 9297 Last data filed at 03/26/2024 1900 Gross per 24 hour  Intake 0 ml  Output 550 ml  Net -550 ml   Filed Weights   02/11/24 0633 02/17/24 0405 02/20/24 2100  Weight: 89.9 kg 78.3 kg 79.1 kg    Physical Exam:  General: awake, alert, NAD HEENT: atraumatic, clear conjunctiva, anicteric sclera, MMM, hearing grossly normal Respiratory: normal respiratory effort. Cardiovascular: quick capillary refill Nervous: A&O x self. no gross focal neurologic deficits, normal speech Extremities: moves all equally, no edema, normal tone Skin: dry, intact, normal temperature, normal color. No rashes, lesions or ulcers on exposed skin Psychiatry: pleasant mood  Labs   I have personally reviewed the following labs and imaging studies CBC    Component Value Date/Time   WBC 5.7 03/21/2024 0513   RBC 4.90 03/21/2024 0513   HGB 15.2 03/21/2024 0513   HGB 16.5 05/06/2013 1942   HCT 44.3 03/21/2024 0513   HCT 46.7 05/06/2013 1942   PLT 152 03/21/2024 0513   PLT 129 (L) 05/06/2013 1942   MCV 90.4 03/21/2024 0513   MCV 92 05/06/2013 1942   MCH 31.0 03/21/2024 0513   MCHC 34.3 03/21/2024 0513   RDW 12.6 03/21/2024 0513   RDW 13.2 05/06/2013 1942   LYMPHSABS 0.9 02/18/2024 0514   MONOABS 0.5 02/18/2024 0514   EOSABS 0.1 02/18/2024 0514   BASOSABS 0.0 02/18/2024 0514  Latest Ref Rng & Units 03/21/2024    5:13 AM 03/17/2024    4:51 AM 03/09/2024    4:32 AM  BMP  Glucose 70 - 99 mg/dL 891  886    BUN 8 - 23 mg/dL 25  27    Creatinine 9.38 - 1.24 mg/dL 8.98  9.12  9.06   Sodium 135 - 145 mmol/L 139  138    Potassium 3.5 - 5.1 mmol/L 4.0  3.9    Chloride 98 - 111 mmol/L 105  106    CO2 22 - 32 mmol/L 27  26     Calcium 8.9 - 10.3 mg/dL 9.1  9.0     No results found.  Disposition Plan & Communication  Patient status: Inpatient  Admitted From: Home Planned disposition location: Skilled nursing facility Anticipated discharge date: unknown pending placement  Family Communication: none at bedside    Author: Marien LITTIE Piety, DO [facility] 03/27/2024, 7:02 AM   Available by Epic secure chat 7AM-7PM. If 7PM-7AM, please contact night-coverage.  TRH contact information found on [email or web address].

## 2024-03-27 NOTE — TOC Progression Note (Addendum)
 Transition of Care [facility]) - Progression Note    Patient Details  Name: BUEL MOLDER MRN: [id number] Date of Birth: 12/19/1945  [facility] CM/SW Contact  Orland Visconti E Kirubel Aja, LCSW Phone Number: 03/27/2024, 8:59 AM  Clinical Narrative:    CSW called Tammy with [facility] - she is able to accept patient pending auth.   Florence American with DSS - no answer. Called [name] (DSS Director) - left VM. Called nonemergent line- received call from On call SW Vernell with DSS will have an APS Supervisor call to confirm plan for [facility].  Called [facility], spoke with Jori, started auth for [facility].    1:32GLENWOOD Aden , Supervisor with DSS states patient can go to [facility] when shara is back.   Expected Discharge Plan: Skilled Nursing Facility Barriers to Discharge: Continued Medical Work up  Expected Discharge Plan and Services       Living arrangements for the past 2 months: Assisted Living Facility                                       Social Determinants of Health (SDOH) Interventions SDOH Screenings   Food Insecurity: No Food Insecurity (02/11/2024)  Housing: Low Risk  (02/11/2024)  Transportation Needs: No Transportation Needs (02/11/2024)  Utilities: Not At Risk (02/11/2024)  Social Connections: Socially Isolated (02/11/2024)  Tobacco Use: Low Risk  (02/12/2024)    Readmission Risk Interventions     No data to display

## 2024-03-28 DIAGNOSIS — G309 Alzheimer's disease, unspecified: Secondary | ICD-10-CM | POA: Diagnosis not present

## 2024-03-28 DIAGNOSIS — R55 Syncope and collapse: Secondary | ICD-10-CM | POA: Diagnosis not present

## 2024-03-28 DIAGNOSIS — F0280 Dementia in other diseases classified elsewhere without behavioral disturbance: Secondary | ICD-10-CM | POA: Diagnosis not present

## 2024-03-28 DIAGNOSIS — R001 Bradycardia, unspecified: Secondary | ICD-10-CM | POA: Diagnosis not present

## 2024-03-28 NOTE — Plan of Care (Signed)

## 2024-03-28 NOTE — Progress Notes (Signed)
 PROGRESS NOTE PRESS CASALE    DOB: 1946-05-24, 78 y.o.  FMW:[id number]    Code Status: Limited: Do not attempt resuscitation (DNR) -DNR-LIMITED -Do Not Intubate/DNI    DOA: 02/11/2024   LOS: 46  Brief hospital course  Seth Thompson is a 78 y.o. male with medical history significant of essential hypertension, dementia, frequent PVCs, hyperlipidemia who was brought in from a facility after the patient lost consciousness. Tx pneumonia, also severe bradycardia likely cause for syncope, but not good candidate for pacer. Echo showed EF 60 to 65% with normal diastolic parameters.Telemetry unremarkable. Patient is currently stable for discharge pending safe dispo plan.  DSS guardian to be involved in dispo planning.   Assessment & Plan  Principal Problem:   Bradycardia with less than 30 beats per minute Active Problems:   Incarcerated right inguinal hernia   Syncope   Alzheimer's disease (HCC)  Acute metabolic encephalopathy on chronic cognitive impairment - now at baseline Severe dementia without behavioral disturbances. No behavior disturbances for well over a week, per report Sun-downing seroquel  50 mg nightly   Syncope in the setting of bradycardia-improved. HR 50-80. Does not appear to be symptomatic with mobility  Avoid AV nodal blocking agents. Cardiologist advised no intervention of permanent pacemaker at this time.   Community-acquired pneumonia - resolved. ORA received Augmentin  for total 5 days.   Hypokalemia Replace as needed Monitor BMP periodically   Incarcerated right sided inguinal hernia No surgical intervention recommended by surgery team. Continue as needed pain medication   Essential hypertension, cont amlodipine  and losartan    Hyperlipidemia No statin  Body mass index is 25.02 kg/m.  VTE ppx: enoxaparin  (LOVENOX ) injection 40 mg Start: 02/11/24 2200  Diet:     Diet   Diet regular Room service appropriate? Yes; Fluid consistency: Thin    Consultants: Surgery Cardiology   Subjective 03/28/24    Pt reports no complaints. Unable to appropriately answer questions. Pleasantly confused.     Objective  Blood pressure (!) 147/65, pulse 66, temperature (!) 97.4 F (36.3 C), temperature source Oral, resp. rate 20, height 5' 10 (1.778 m), weight 79.1 kg, SpO2 100%.  Intake/Output Summary (Last 24 hours) at 03/28/2024 0715 Last data filed at 03/28/2024 0450 Gross per 24 hour  Intake --  Output 400 ml  Net -400 ml   Filed Weights   02/11/24 9366 02/17/24 0405 02/20/24 2100  Weight: 89.9 kg 78.3 kg 79.1 kg    Physical Exam:  General: awake, alert, NAD HEENT: atraumatic, clear conjunctiva, anicteric sclera, MMM, hearing grossly normal Respiratory: normal respiratory effort. Cardiovascular: quick capillary refill Nervous: A&O x self. no gross focal neurologic deficits, normal speech Extremities: moves all equally, no edema, normal tone Skin: dry, intact, normal temperature, normal color. No rashes, lesions or ulcers on exposed skin Psychiatry: pleasant mood  Labs   I have personally reviewed the following labs and imaging studies CBC    Component Value Date/Time   WBC 5.7 03/21/2024 0513   RBC 4.90 03/21/2024 0513   HGB 15.2 03/21/2024 0513   HGB 16.5 05/06/2013 1942   HCT 44.3 03/21/2024 0513   HCT 46.7 05/06/2013 1942   PLT 152 03/21/2024 0513   PLT 129 (L) 05/06/2013 1942   MCV 90.4 03/21/2024 0513   MCV 92 05/06/2013 1942   MCH 31.0 03/21/2024 0513   MCHC 34.3 03/21/2024 0513   RDW 12.6 03/21/2024 0513   RDW 13.2 05/06/2013 1942   LYMPHSABS 0.9 02/18/2024 0514   MONOABS 0.5 02/18/2024 0514  EOSABS 0.1 02/18/2024 0514   BASOSABS 0.0 02/18/2024 0514      Latest Ref Rng & Units 03/21/2024    5:13 AM 03/17/2024    4:51 AM 03/09/2024    4:32 AM  BMP  Glucose 70 - 99 mg/dL 891  886    BUN 8 - 23 mg/dL 25  27    Creatinine 9.38 - 1.24 mg/dL 8.98  9.12  9.06   Sodium 135 - 145 mmol/L 139  138     Potassium 3.5 - 5.1 mmol/L 4.0  3.9    Chloride 98 - 111 mmol/L 105  106    CO2 22 - 32 mmol/L 27  26    Calcium 8.9 - 10.3 mg/dL 9.1  9.0     No results found.  Disposition Plan & Communication  Patient status: Inpatient  Admitted From: Home Planned disposition location: Skilled nursing facility Anticipated discharge date: unknown pending placement  Family Communication: none at bedside    Author: Marien LITTIE Piety, DO [facility] 03/28/2024, 7:15 AM   Available by Epic secure chat 7AM-7PM. If 7PM-7AM, please contact night-coverage.  TRH contact information found on [email or web address].

## 2024-03-28 NOTE — TOC Progression Note (Addendum)
 Transition of Care [facility]) - Progression Note    Patient Details  Name: Seth Thompson MRN: [id number] Date of Birth: [birth year]  Transition of Care [facility]) CM/SW Contact  Jaydenn Boccio E Aseret Hoffman, LCSW Phone Number: 03/28/2024, 8:31 AM  Clinical Narrative:    [facility] is requesting a peer to peer, due at 130 pm. Dr Janit [phone]  - notified MD  8:50- Call to [facility] with [facility], she will ask Catina to call CSW back - need to inquire if they can again reconsider taking patient back or still feel he needs rehab before returning.  12:25- Attempted to reach out to [facility] again, Camie states she gave [place] this CSW's number to call.  12:38- MD has spoken with Dr. Janit with [facility] who denied the SNF request. They state [facility] needs to evaluate patient again to see if he can return.  12:50- Call from [place], Interior and spatial designer with [employer]. Informed him that patient walked 160 feet with HHA with Mobility Specialist. Sherlean states he will have Catina come assess, hopefully tomorrow.  3:20- Call from [place] with [facility]. Informed her of update above.  Jori states ambulance was approved auth # [id number]. Jori will wait to officially deny the SNF auth until [facility] comes to assess tomorrow. Provided weekday TOC contact info.  Expected Discharge Plan: Skilled Nursing Facility Barriers to Discharge: Continued Medical Work up  Expected Discharge Plan and Services       Living arrangements for the past 2 months: Assisted Living Facility                                       Social Determinants of Health (SDOH) Interventions SDOH Screenings   Food Insecurity: No Food Insecurity (02/11/2024)  Housing: Low Risk  (02/11/2024)  Transportation Needs: No Transportation Needs (02/11/2024)  Utilities: Not At Risk (02/11/2024)  Social Connections: Socially Isolated (02/11/2024)  Tobacco Use: Low Risk  (02/12/2024)    Readmission Risk Interventions     No data to display

## 2024-03-29 DIAGNOSIS — G309 Alzheimer's disease, unspecified: Secondary | ICD-10-CM | POA: Diagnosis not present

## 2024-03-29 DIAGNOSIS — R001 Bradycardia, unspecified: Secondary | ICD-10-CM | POA: Diagnosis not present

## 2024-03-29 DIAGNOSIS — R55 Syncope and collapse: Secondary | ICD-10-CM | POA: Diagnosis not present

## 2024-03-29 DIAGNOSIS — F0280 Dementia in other diseases classified elsewhere without behavioral disturbance: Secondary | ICD-10-CM | POA: Diagnosis not present

## 2024-03-29 NOTE — Plan of Care (Signed)

## 2024-03-29 NOTE — Progress Notes (Signed)
 PROGRESS NOTE Seth Thompson    DOB: [birth year], 78 y.o.  FMW:[id number]    Code Status: Limited: Do not attempt resuscitation (DNR) -DNR-LIMITED -Do Not Intubate/DNI    DOA: 02/11/2024   LOS: 47  Brief hospital course  Seth Thompson is a 78 y.o. male with medical history significant of essential hypertension, dementia, frequent PVCs, hyperlipidemia who was brought in from a facility after the patient lost consciousness. Tx pneumonia, also severe bradycardia likely cause for syncope, but not good candidate for pacer. Echo showed EF 60 to 65% with normal diastolic parameters.Telemetry unremarkable. Patient is currently stable for discharge pending safe dispo plan.  DSS guardian to be involved in dispo planning.  ALF scheduled to evaluate today for return to facility.   Assessment & Plan  Principal Problem:   Bradycardia with less than 30 beats per minute Active Problems:   Incarcerated right inguinal hernia   Syncope   Alzheimer's disease (HCC)  Acute metabolic encephalopathy on chronic cognitive impairment - now at baseline Severe dementia without behavioral disturbances. No behavior disturbances for well over a week, per report Sun-downing seroquel  50 mg nightly   Syncope in the setting of bradycardia-improved. HR 50-80. Does not appear to be symptomatic with mobility  Avoid AV nodal blocking agents. Cardiologist advised no intervention of permanent pacemaker at this time.   Community-acquired pneumonia - resolved. ORA received Augmentin  for total 5 days.   Hypokalemia Replace as needed Monitor BMP periodically   Incarcerated right sided inguinal hernia No surgical intervention recommended by surgery team. Continue as needed pain medication   Essential hypertension, cont amlodipine  and losartan    Hyperlipidemia No statin  Body mass index is 25.02 kg/m.  VTE ppx: enoxaparin  (LOVENOX ) injection 40 mg Start: 02/11/24 2200  Diet:     Diet   Diet regular Room  service appropriate? Yes; Fluid consistency: Thin   Consultants: Surgery Cardiology   Subjective 03/29/24    Pt reports no complaints. Unable to appropriately answer questions. Pleasantly confused.     Objective  Blood pressure (!) 147/65, pulse 66, temperature (!) 97.4 F (36.3 C), temperature source Oral, resp. rate 20, height 5' 10 (1.778 m), weight 79.1 kg, SpO2 100%.  Intake/Output Summary (Last 24 hours) at 03/29/2024 0713 Last data filed at 03/28/2024 1823 Gross per 24 hour  Intake 240 ml  Output 500 ml  Net -260 ml   Filed Weights   02/11/24 9366 02/17/24 0405 02/20/24 2100  Weight: 89.9 kg 78.3 kg 79.1 kg    Physical Exam:  General: awake, alert, NAD HEENT: atraumatic, clear conjunctiva, anicteric sclera, MMM, hearing grossly normal Respiratory: normal respiratory effort. Cardiovascular: quick capillary refill Nervous: A&O x self. no gross focal neurologic deficits, normal speech Extremities: moves all equally, no edema, normal tone Skin: dry, intact, normal temperature, normal color. No rashes, lesions or ulcers on exposed skin Psychiatry: pleasant mood  Labs   I have personally reviewed the following labs and imaging studies CBC    Component Value Date/Time   WBC 5.7 03/21/2024 0513   RBC 4.90 03/21/2024 0513   HGB 15.2 03/21/2024 0513   HGB 16.5 05/06/2013 1942   HCT 44.3 03/21/2024 0513   HCT 46.7 05/06/2013 1942   PLT 152 03/21/2024 0513   PLT 129 (L) 05/06/2013 1942   MCV 90.4 03/21/2024 0513   MCV 92 05/06/2013 1942   MCH 31.0 03/21/2024 0513   MCHC 34.3 03/21/2024 0513   RDW 12.6 03/21/2024 0513   RDW 13.2 05/06/2013 1942  LYMPHSABS 0.9 02/18/2024 0514   MONOABS 0.5 02/18/2024 0514   EOSABS 0.1 02/18/2024 0514   BASOSABS 0.0 02/18/2024 0514      Latest Ref Rng & Units 03/21/2024    5:13 AM 03/17/2024    4:51 AM 03/09/2024    4:32 AM  BMP  Glucose 70 - 99 mg/dL 891  886    BUN 8 - 23 mg/dL 25  27    Creatinine 9.38 - 1.24 mg/dL 8.98  9.12   9.06   Sodium 135 - 145 mmol/L 139  138    Potassium 3.5 - 5.1 mmol/L 4.0  3.9    Chloride 98 - 111 mmol/L 105  106    CO2 22 - 32 mmol/L 27  26    Calcium 8.9 - 10.3 mg/dL 9.1  9.0     No results found.  Disposition Plan & Communication  Patient status: Inpatient  Admitted From: Home Planned disposition location: Skilled nursing facility Anticipated discharge date: unknown pending placement  Family Communication: none at bedside    Author: Marien LITTIE Piety, DO [facility] 03/29/2024, 7:13 AM   Available by Epic secure chat 7AM-7PM. If 7PM-7AM, please contact night-coverage.  TR[facility] contact information found on [email or web address].

## 2024-03-29 NOTE — Progress Notes (Signed)
 Occupational Therapy Treatment Patient Details Name: Seth Thompson MRN: [id number] DOB: [birth year] Today's Date: 03/29/2024   History of present illness Pt is a 78 year old male admitted after LOC in shower, admitted with acute metabolic encephalopathy, syncope in the setting of bradycardia, hypokalemia, incarcerated right sided inguinal hernia. PMH significant for essential hypertension, dementia, frequent PVCs, and hyperlipidemia.   OT comments  Pt seen for OT tx. Pt received in bed, malpositioned for comfort and pressure relief. Pt pleasant and attempts to converse with therapist but unable to express himself well, using a combination of whistles, hand gestures, and words in attempt. Pt required MAX A for repositioning with assist from bed to improve posture. Adjusted into chair position in bed. Once posture improved, pt set up with multimodal simple cues to wash his face, ultimately requiring MOD A to continue task to completion. Pt continues to benefit from skilled OT services. DC plan remains appropriate.       If plan is discharge home, recommend the following:  Supervision due to cognitive status;A lot of help with bathing/dressing/bathroom;Direct supervision/assist for medications management;Direct supervision/assist for financial management;Assist for transportation;Assistance with cooking/housework;Help with stairs or ramp for entrance;Assistance with feeding;A lot of help with walking and/or transfers   Equipment Recommendations  Wheelchair (measurements OT);Wheelchair cushion (measurements OT)    Recommendations for Other Services      Precautions / Restrictions Precautions Precautions: Fall Recall of Precautions/Restrictions: Impaired Restrictions Weight Bearing Restrictions Per Provider Order: No       Mobility Bed Mobility               General bed mobility comments: MAX A with bed assist for repositioning to optimize comfort for chair position in bed     Transfers                         Balance                                           ADL either performed or assessed with clinical judgement   ADL Overall ADL's : Needs assistance/impaired     Grooming: Sitting;Bed level;Moderate assistance Grooming Details (indicate cue type and reason): long sitting in bed, set up with multimodal simple cues and MOD A to complete task as pt unable to sustain task to completion                                    Extremity/Trunk Assessment Upper Extremity Assessment Upper Extremity Assessment: Defer to OT evaluation   Lower Extremity Assessment Lower Extremity Assessment: Difficult to assess due to impaired cognition   Cervical / Trunk Assessment Cervical / Trunk Assessment: Normal    Vision       Perception     Praxis     Communication Communication Communication: Impaired Factors Affecting Communication: Difficulty expressing self   Cognition Arousal: Alert Behavior During Therapy: WFL for tasks assessed/performed Cognition: History of cognitive impairments             OT - Cognition Comments: pleasant, difficulty following instructions                 Following commands: Impaired Following commands impaired: Follows one step commands inconsistently, Follows one step commands with increased time  Cueing   Cueing Techniques: Verbal cues, Gestural cues, Tactile cues, Visual cues  Exercises      Shoulder Instructions       General Comments Pt required additional time to perform all movement today, he was easily distracted and hesitant to perform activity without an engaging stimuli.  Pre session: 57 bpm, SpO2:97% on room air, Post session: 75 bpm, SpO2:95% on room air.    Pertinent Vitals/ Pain       Pain Assessment Pain Assessment: PAINAD Breathing: normal Negative Vocalization: none Facial Expression: smiling or inexpressive Body Language:  relaxed Consolability: no need to console PAINAD Score: 0 Pain Intervention(s): Monitored during session, Repositioned  Home Living Family/patient expects to be discharged to:: Other (Comment)                                 Additional Comments: Pt from [employer]      Prior Functioning/Environment              Frequency  Min 1X/week        Progress Toward Goals  OT Goals(current goals can now be found in the care plan section)  Progress towards OT goals: Progressing toward goals  Acute Rehab OT Goals OT Goal Formulation: Patient unable to participate in goal setting Time For Goal Achievement: 04/23/24 Potential to Achieve Goals: Fair  Plan      Co-evaluation                 AM-PAC OT 6 Clicks Daily Activity     Outcome Measure   Help from another person eating meals?: A Little Help from another person taking care of personal grooming?: A Lot Help from another person toileting, which includes using toliet, bedpan, or urinal?: A Lot Help from another person bathing (including washing, rinsing, drying)?: A Lot Help from another person to put on and taking off regular upper body clothing?: A Lot Help from another person to put on and taking off regular lower body clothing?: A Lot 6 Click Score: 13    End of Session    OT Visit Diagnosis: Other abnormalities of gait and mobility (R26.89);Cognitive communication deficit (R41.841);Other symptoms and signs involving cognitive function Symptoms and signs involving cognitive functions: Other cerebrovascular disease   Activity Tolerance Patient tolerated treatment well   Patient Left in bed;with call bell/phone within reach;with bed alarm set;Other (comment) (telesitter)   Nurse Communication          Time: 1520-1530 OT Time Calculation (min): 10 min  Charges: OT General Charges $OT Visit: 1 Visit OT Treatments $Self Care/Home Management : 8-22 mins  Warren SAUNDERS., MPH, MS,  OTR/L ascom [phone] 03/29/24, 4:07 PM

## 2024-03-29 NOTE — TOC Progression Note (Signed)
 Transition of Care [facility]) - Progression Note    Patient Details  Name: Seth Thompson MRN: [id number] Date of Birth: Jul 11, 1946  Transition of Care [facility]) CM/SW Contact  Lauraine JAYSON Carpen, LCSW Phone Number: 03/29/2024, 2:28 PM  Clinical Narrative:   [facility] is unsure when they will be able to come assess patient. CSW left voicemail for legal guardian to notify.  Expected Discharge Plan: Skilled Nursing Facility Barriers to Discharge: Continued Medical Work up  Expected Discharge Plan and Services       Living arrangements for the past 2 months: Assisted Living Facility                                       Social Determinants of Health (SDOH) Interventions SDOH Screenings   Food Insecurity: No Food Insecurity (02/11/2024)  Housing: Low Risk  (02/11/2024)  Transportation Needs: No Transportation Needs (02/11/2024)  Utilities: Not At Risk (02/11/2024)  Social Connections: Socially Isolated (02/11/2024)  Tobacco Use: Low Risk  (02/12/2024)    Readmission Risk Interventions     No data to display

## 2024-03-29 NOTE — Evaluation (Signed)
 Physical Therapy Re-Evaluation Patient Details Name: Seth Thompson MRN: [id number] DOB: 11/10/1945 Today's Date: 03/29/2024  History of Present Illness  Pt is a 78 year old male admitted after LOC in shower, admitted with acute metabolic encephalopathy, syncope in the setting of bradycardia, hypokalemia, incarcerated right sided inguinal hernia. PMH significant for essential hypertension, dementia, frequent PVCs, and hyperlipidemia.  Clinical Impression  PT performed a re-evaluation due to extended length of stay. PT focused session on reducing the need of PT's extracorporeal support during mobility, which continues to improves with every PT/ other rehabilitative discipline sessions. During bed mobility pt required mod A, with constant vc's and tactile cues to successfully roll, and sit EOB. It is important to note that the additional support needed for bed mobility does not come from a lack of effort or strength but due to pt's cognitive status and receptive ability to understand task requested. Pt does have moments where he attempts to initiate tasked mobility without assistance, but has limited ability to complete or sequence the remainder of the task without the additional support. Pt was able to perform a STS after a few unsuccessful attempts (with RW, vc's and tactile cues) without an AD and required min/mod assist to initiate the transfer. During ambulation, pt was able to ambulate 130 ft with intermittent use of hand held assist, without an overt LOB. This is an improvement in required PT assistance from previous sessions despite noted shorter bout of ambulation. Due to extended length of stay, PT updated goals and POC to meet pt's needs. Pt will benefit from skilled therapeutic interventions to return to PLOF.           If plan is discharge home, recommend the following: Assistance with cooking/housework;Assistance with feeding;Help with stairs or ramp for entrance;A lot of help with  bathing/dressing/bathroom;A lot of help with walking and/or transfers;Supervision due to cognitive status   Can travel by private vehicle   Yes    Equipment Recommendations None recommended by PT  Recommendations for Other Services       Functional Status Assessment Patient has had a recent decline in their functional status and/or demonstrates limited ability to make significant improvements in function in a reasonable and predictable amount of time     Precautions / Restrictions Precautions Precautions: Fall Recall of Precautions/Restrictions: Impaired Precaution/Restrictions Comments: dementia Restrictions Weight Bearing Restrictions Per Provider Order: No      Mobility  Bed Mobility Overal bed mobility: Needs Assistance Bed Mobility: Rolling, Sidelying to Sit Rolling: Mod assist Sidelying to sit: Mod assist       General bed mobility comments: Mod assist needed due to cognitive status and receptive ability to understand task requested. Pt has has inconsistent moments where they attempt to initiate movement, without assistance, but it is not maintained thus reverting back to needed increased assitance to perform task.    Transfers Overall transfer level: Needs assistance Equipment used: 1 person hand held assist Transfers: Sit to/from Stand Sit to Stand: Min assist, Mod assist           General transfer comment: PT utilized a plethora of cues with inconsistent efficacies to get pt to perform STS. After a few unsuccessful attempts with RW, vc's and tactile cues, PT was able to find a stimuli that enticed movement.    Ambulation/Gait Ambulation/Gait assistance: Min assist Gait Distance (Feet): 130 Feet Assistive device: 1 person hand held assist (intermittent use of handheld assist) Gait Pattern/deviations: Step-through pattern, Decreased step length - right, Decreased step  length - left, Decreased dorsiflexion - right, Decreased dorsiflexion - left Gait  velocity: decreased     General Gait Details: Pt with halting gait, requiring constant vc's and redirection towards enticing stimuli to be able to perform ambulation. Pt did not have any overt LOB, and was able to ambulate in the direction of desired item (cup of apple sauce).   Stairs            Wheelchair Mobility     Tilt Bed    Modified Rankin (Stroke Patients Only)       Balance Overall balance assessment: Needs assistance Sitting-balance support: Feet supported, Bilateral upper extremity supported, Feet unsupported Sitting balance-Leahy Scale: Fair Sitting balance - Comments: Initially needed mod assist to prevent posterior lean, PT reduced feeback and pt was able to maintain seated posture without his feet supported. Postural control: Posterior lean Standing balance support: Single extremity supported, During functional activity Standing balance-Leahy Scale: Fair Standing balance comment: Pt able to stand statically with vc's for approximately 1 minute while PT provided peri care due to noted BM while seated EOB.                             Pertinent Vitals/Pain Pain Assessment Pain Assessment: PAINAD Breathing: normal Negative Vocalization: occasional moan/groan, low speech, negative/disapproving quality Facial Expression: smiling or inexpressive Body Language: relaxed Consolability: no need to console PAINAD Score: 1 Pain Intervention(s): Limited activity within patient's tolerance, Monitored during session, Relaxation    Home Living Family/patient expects to be discharged to:: Other (Comment)                   Additional Comments: Pt from [employer]    Prior Function Prior Level of Function : Needs assist  Cognitive Assist : ADLs (cognitive)   ADLs (Cognitive): Step by step cues Physical Assist : ADLs (physical)   ADLs (physical): Feeding;Grooming;Bathing;Dressing;Toileting;IADLs Mobility Comments: per facillity pt is  independent with amb without AD, no fall history ADLs Comments: per facility- extensive assist for ADL/IADL     Extremity/Trunk Assessment   Upper Extremity Assessment Upper Extremity Assessment: Defer to OT evaluation    Lower Extremity Assessment Lower Extremity Assessment: Difficult to assess due to impaired cognition    Cervical / Trunk Assessment Cervical / Trunk Assessment: Normal  Communication   Communication Communication: Impaired Factors Affecting Communication: Difficulty expressing self    Cognition Arousal: Alert Behavior During Therapy: WFL for tasks assessed/performed (Increased resistance to excericse, but resolved when given an objective to work for)   PT - Cognitive impairments: History of cognitive impairments                       PT - Cognition Comments: Per previous notes, ALF staff came in to assess and noted that he is at his baseline for cognition Following commands: Impaired Following commands impaired: Follows one step commands inconsistently, Follows one step commands with increased time     Cueing Cueing Techniques: Verbal cues, Gestural cues, Tactile cues, Visual cues     General Comments General comments (skin integrity, edema, etc.): Pt required additional time to perform all movement today, he was easily distracted and hesitant to perform activity without an engaging stimuli.  Pre session: 57 bpm, SpO2:97% on room air, Post session: 75 bpm, SpO2:95% on room air.    Exercises     Assessment/Plan    PT Assessment Patient needs continued PT services  PT Problem List Decreased strength;Decreased balance;Decreased activity tolerance;Decreased mobility;Decreased cognition;Decreased safety awareness       PT Treatment Interventions DME instruction;Gait training;Functional mobility training;Therapeutic activities;Therapeutic exercise;Balance training;Patient/family education    PT Goals (Current goals can be found in the Care Plan  section)  Acute Rehab PT Goals PT Goal Formulation: Patient unable to participate in goal setting Time For Goal Achievement: 03/29/24 Potential to Achieve Goals: Fair    Frequency Min 1X/week     Co-evaluation               AM-PAC PT 6 Clicks Mobility  Outcome Measure Help needed turning from your back to your side while in a flat bed without using bedrails?: A Lot Help needed moving from lying on your back to sitting on the side of a flat bed without using bedrails?: A Lot Help needed moving to and from a bed to a chair (including a wheelchair)?: A Lot Help needed standing up from a chair using your arms (e.g., wheelchair or bedside chair)?: A Lot Help needed to walk in hospital room?: A Little Help needed climbing 3-5 steps with a railing? : A Lot 6 Click Score: 13    End of Session Equipment Utilized During Treatment: Gait belt Activity Tolerance: Patient tolerated treatment well (limited by cognition) Patient left: Other (comment);with bed alarm set;in chair;with chair alarm set;with call bell/phone within reach Chiropractor) Nurse Communication: Mobility status PT Visit Diagnosis: Unsteadiness on feet (R26.81);Difficulty in walking, not elsewhere classified (R26.2);Muscle weakness (generalized) (M62.81)    Time: 8880-8847 PT Time Calculation (min) (ACUTE ONLY): 33 min   Charges:               Shiquan Mathieu, SPT 03/29/24, 2:41 PM

## 2024-03-30 DIAGNOSIS — R55 Syncope and collapse: Secondary | ICD-10-CM | POA: Diagnosis not present

## 2024-03-30 DIAGNOSIS — R001 Bradycardia, unspecified: Secondary | ICD-10-CM | POA: Diagnosis not present

## 2024-03-30 DIAGNOSIS — G309 Alzheimer's disease, unspecified: Secondary | ICD-10-CM | POA: Diagnosis not present

## 2024-03-30 DIAGNOSIS — F0280 Dementia in other diseases classified elsewhere without behavioral disturbance: Secondary | ICD-10-CM | POA: Diagnosis not present

## 2024-03-30 MED ORDER — AMLODIPINE BESYLATE 10 MG PO TABS
10.0000 mg | ORAL_TABLET | Freq: Every day | ORAL | Status: DC
  Administered 2024-03-30 – 2024-04-02 (×4): 10 mg via ORAL
  Filled 2024-03-30 (×4): qty 1

## 2024-03-30 NOTE — Progress Notes (Signed)
 Mobility Specialist - Progress Note    03/30/24 1000  Mobility  Activity Ambulated with assistance in hallway;Transferred from bed to chair  Level of Assistance Minimal assist, patient does 75% or more  Assistive Device Front wheel walker  Distance Ambulated (ft) 160 ft  Activity Response Tolerated well  Mobility visit 1 Mobility  Mobility Specialist Start Time (ACUTE ONLY) 1025  Mobility Specialist Stop Time (ACUTE ONLY) 1045  Mobility Specialist Time Calculation (min) (ACUTE ONLY) 20 min    Pt lying in bed upon arrival, utilizing RA. Pt pleasant and agreeable to activity. Completed bed mobility with modA + tactile cueing for sequence and initiation d/t cognitive deficits. After few unsuccessful attempts at standing, pt able to come to rise with modA. RW trial. Pt completed lap around NS with minA. Occasional assist to maintain hand placement onto RW as pt is easily distracted in busy hallway. Mild RW negotiation for sequencing turns. Pt returned to chair with alarm set, needs in reach.    Lennette Seip Mobility Specialist 03/30/24, 10:51 AM

## 2024-03-30 NOTE — Progress Notes (Signed)
 PROGRESS NOTE OGDEN HANDLIN    DOB: 17-Oct-1945, 78 y.o.  FMW:[id number]    Code Status: Limited: Do not attempt resuscitation (DNR) -DNR-LIMITED -Do Not Intubate/DNI    DOA: 02/11/2024   LOS: 48  Brief hospital course  Seth Thompson is a 78 y.o. male with medical history significant of essential hypertension, dementia, frequent PVCs, hyperlipidemia who was brought in from a facility after the patient lost consciousness. Tx pneumonia, also severe bradycardia likely cause for syncope, but not good candidate for pacer. Echo showed EF 60 to 65% with normal diastolic parameters.Telemetry unremarkable. Patient is currently stable for discharge pending safe dispo plan.  DSS guardian to be involved in dispo planning.  ALF did not come to evaluate. Awaiting their evaluation and placement decision.   Assessment & Plan  Principal Problem:   Bradycardia with less than 30 beats per minute Active Problems:   Incarcerated right inguinal hernia   Syncope   Alzheimer's disease (HCC)  Acute metabolic encephalopathy on chronic cognitive impairment - now at baseline Severe dementia without behavioral disturbances. No behavior disturbances for well over a week, per report Sun-downing seroquel  50 mg nightly   Syncope in the setting of bradycardia-improved. HR 50-80. Does not appear to be symptomatic with mobility  Avoid AV nodal blocking agents. Cardiologist advised no intervention of permanent pacemaker at this time.   Community-acquired pneumonia - resolved. ORA received Augmentin  for total 5 days.   Hypokalemia Replace as needed Monitor BMP periodically   Incarcerated right sided inguinal hernia No surgical intervention recommended by surgery team. Continue as needed pain medication   Essential hypertension, cont losartan  increased amlodipine  to 10mg  daily   Hyperlipidemia No statin  Body mass index is 25.02 kg/m.  VTE ppx: enoxaparin  (LOVENOX ) injection 40 mg Start: 02/11/24  2200  Diet:     Diet   Diet regular Room service appropriate? Yes; Fluid consistency: Thin   Consultants: Surgery Cardiology   Subjective 03/30/24    Pt reports no complaints. Indicates that he wants to eat breakfast    Objective  Blood pressure (!) 147/65, pulse 66, temperature (!) 97.4 F (36.3 C), temperature source Oral, resp. rate 20, height 5' 10 (1.778 m), weight 79.1 kg, SpO2 100%.  Intake/Output Summary (Last 24 hours) at 03/30/2024 0710 Last data filed at 03/30/2024 0643 Gross per 24 hour  Intake 480 ml  Output 850 ml  Net -370 ml   Filed Weights   02/11/24 9366 02/17/24 0405 02/20/24 2100  Weight: 89.9 kg 78.3 kg 79.1 kg    Physical Exam:  General: awake, alert, NAD HEENT: atraumatic, clear conjunctiva, anicteric sclera, MMM, hearing grossly normal Respiratory: normal respiratory effort. Cardiovascular: quick capillary refill Nervous: Alert and unable to orient to self. no gross focal neurologic deficits, normal speech Extremities: moves all equally, no edema, normal tone Skin: dry, intact, normal temperature, normal color. No rashes, lesions or ulcers on exposed skin Psychiatry: pleasant mood  Labs   I have personally reviewed the following labs and imaging studies CBC    Component Value Date/Time   WBC 5.7 03/21/2024 0513   RBC 4.90 03/21/2024 0513   HGB 15.2 03/21/2024 0513   HGB 16.5 05/06/2013 1942   HCT 44.3 03/21/2024 0513   HCT 46.7 05/06/2013 1942   PLT 152 03/21/2024 0513   PLT 129 (L) 05/06/2013 1942   MCV 90.4 03/21/2024 0513   MCV 92 05/06/2013 1942   MCH 31.0 03/21/2024 0513   MCHC 34.3 03/21/2024 0513   RDW 12.6  03/21/2024 0513   RDW 13.2 05/06/2013 1942   LYMPHSABS 0.9 02/18/2024 0514   MONOABS 0.5 02/18/2024 0514   EOSABS 0.1 02/18/2024 0514   BASOSABS 0.0 02/18/2024 0514      Latest Ref Rng & Units 03/21/2024    5:13 AM 03/17/2024    4:51 AM 03/09/2024    4:32 AM  BMP  Glucose 70 - 99 mg/dL 891  886    BUN 8 - 23 mg/dL 25   27    Creatinine 9.38 - 1.24 mg/dL 8.98  9.12  9.06   Sodium 135 - 145 mmol/L 139  138    Potassium 3.5 - 5.1 mmol/L 4.0  3.9    Chloride 98 - 111 mmol/L 105  106    CO2 22 - 32 mmol/L 27  26    Calcium 8.9 - 10.3 mg/dL 9.1  9.0     No results found.  Disposition Plan & Communication  Patient status: Inpatient  Admitted From: Home Planned disposition location: Skilled nursing facility Anticipated discharge date: unknown pending placement  Family Communication: none at bedside    Author: Marien LITTIE Piety, DO [facility] 03/30/2024, 7:10 AM   Available by Epic secure chat 7AM-7PM. If 7PM-7AM, please contact night-coverage.  TRH contact information found on [email or web address].

## 2024-03-30 NOTE — TOC Progression Note (Addendum)
 Transition of Care [facility]) - Progression Note    Patient Details  Name: MIVAAN CORBITT MRN: [id number] Date of Birth: 1946/09/28  [facility]) CM/SW Contact  Racheal LITTIE Schimke, RN Phone Number: 03/30/2024, 9:10 AM  Clinical Narrative:  Spoke with Lonell REILLY, [phone] about discharge plan, she reports having to get verification of benefits from [facility] to include Medicare number. Also Guardian of the State was out of town and returned yesterday, 03/29/24, she will get paperwork signed for possible admission to [facility].  12:16 pm Medicare number obtained: [id number]. Forwarded to [place], DSS SW.  20 mins      Expected Discharge Plan: Skilled Nursing Facility Barriers to Discharge: Continued Medical Work up  Expected Discharge Plan and Services       Living arrangements for the past 2 months: Assisted Living Facility                                       Social Determinants of Health (SDOH) Interventions SDOH Screenings   Food Insecurity: No Food Insecurity (02/11/2024)  Housing: Low Risk  (02/11/2024)  Transportation Needs: No Transportation Needs (02/11/2024)  Utilities: Not At Risk (02/11/2024)  Social Connections: Socially Isolated (02/11/2024)  Tobacco Use: Low Risk  (02/12/2024)    Readmission Risk Interventions     No data to display

## 2024-03-31 DIAGNOSIS — F0280 Dementia in other diseases classified elsewhere without behavioral disturbance: Secondary | ICD-10-CM | POA: Diagnosis not present

## 2024-03-31 DIAGNOSIS — R001 Bradycardia, unspecified: Secondary | ICD-10-CM | POA: Diagnosis not present

## 2024-03-31 DIAGNOSIS — R55 Syncope and collapse: Secondary | ICD-10-CM | POA: Diagnosis not present

## 2024-03-31 DIAGNOSIS — G309 Alzheimer's disease, unspecified: Secondary | ICD-10-CM | POA: Diagnosis not present

## 2024-03-31 LAB — BASIC METABOLIC PANEL WITH GFR
Anion gap: 8 (ref 5–15)
BUN: 26 mg/dL — ABNORMAL HIGH (ref 8–23)
CO2: 25 mmol/L (ref 22–32)
Calcium: 9.1 mg/dL (ref 8.9–10.3)
Chloride: 106 mmol/L (ref 98–111)
Creatinine, Ser: 0.94 mg/dL (ref 0.61–1.24)
GFR, Estimated: >60 mL/min (ref >60)
Glucose, Bld: 120 mg/dL — ABNORMAL HIGH (ref 70–99)
Potassium: 3.9 mmol/L (ref 3.5–5.1)
Sodium: 139 mmol/L (ref 135–145)

## 2024-03-31 LAB — CBC
HCT: 43.4 % (ref 39.0–52.0)
Hemoglobin: 15 g/dL (ref 13.0–17.0)
MCH: 31.3 pg (ref 26.0–34.0)
MCHC: 34.6 g/dL (ref 30.0–36.0)
MCV: 90.4 fL (ref 80.0–100.0)
Platelets: 131 10*3/uL — ABNORMAL LOW (ref 150–400)
RBC: 4.8 MIL/uL (ref 4.22–5.81)
RDW: 12.8 % (ref 11.5–15.5)
WBC: 5.9 10*3/uL (ref 4.0–10.5)
nRBC: 0 % (ref 0.0–0.2)

## 2024-03-31 NOTE — TOC Progression Note (Signed)
 Transition of Care [facility]) - Progression Note    Patient Details  Name: Seth Thompson MRN: [id number] Date of Birth: Jan 30, 1946  Transition of Care [facility]) CM/SW Contact  Lauraine JAYSON Carpen, LCSW Phone Number: 03/31/2024, 11:24 AM  Clinical Narrative:   [facility] is having their business office manager speak with legal guardian regarding paying privately for placement.  Expected Discharge Plan: Skilled Nursing Facility Barriers to Discharge: Continued Medical Work up  Expected Discharge Plan and Services       Living arrangements for the past 2 months: Assisted Living Facility                                       Social Determinants of Health (SDOH) Interventions SDOH Screenings   Food Insecurity: No Food Insecurity (02/11/2024)  Housing: Low Risk  (02/11/2024)  Transportation Needs: No Transportation Needs (02/11/2024)  Utilities: Not At Risk (02/11/2024)  Social Connections: Socially Isolated (02/11/2024)  Tobacco Use: Low Risk  (02/12/2024)    Readmission Risk Interventions     No data to display

## 2024-03-31 NOTE — TOC Progression Note (Signed)
 Transition of Care [facility]) - Progression Note    Patient Details  Name: Seth Thompson MRN: [id number] Date of Birth: [birth year]  Transition of Care [facility]) CM/SW Contact  Lauraine JAYSON Carpen, LCSW Phone Number: 03/31/2024, 4:24 PM  Clinical Narrative:  [facility] can accept patient tomorrow. Legal guardian is aware. Per PT, [facility] came to assess him this morning but guardian would prefer to move forward with [facility]. Will attempt to get EMS transport due to confusion.   Expected Discharge Plan: Skilled Nursing Facility Barriers to Discharge: Continued Medical Work up  Expected Discharge Plan and Services       Living arrangements for the past 2 months: Assisted Living Facility                                       Social Determinants of Health (SDOH) Interventions SDOH Screenings   Food Insecurity: No Food Insecurity (02/11/2024)  Housing: Low Risk  (02/11/2024)  Transportation Needs: No Transportation Needs (02/11/2024)  Utilities: Not At Risk (02/11/2024)  Social Connections: Socially Isolated (02/11/2024)  Tobacco Use: Low Risk  (02/12/2024)    Readmission Risk Interventions     No data to display

## 2024-03-31 NOTE — Progress Notes (Signed)
 Occupational Therapy Treatment Patient Details Name: Seth Thompson MRN: [id number] DOB: 07/17/1946 Today's Date: 03/31/2024   History of present illness Pt is a 78 year old male admitted after LOC in shower, admitted with acute metabolic encephalopathy, syncope in the setting of bradycardia, hypokalemia, incarcerated right sided inguinal hernia. PMH significant for essential hypertension, dementia, frequent PVCs, and hyperlipidemia.   OT comments  Pt seen for OT tx this date. Pt remains easily internally/externally distracted, difficulties comprehending directions, and communicates with a combination of whistles, hand gestures and some words. Cognition is pt's limiting factor for efficient ADL and mobility performance. Requires up to MOD A with max multimodal cuing to perform STS, OT sets up oral care but pt unable to participate due to distraction, and completes 170 ft functional mobility in hallway with CGA +1 HHA. Pt easily startled by new objects in environment (cart turning quickly around corner), requiring redirection and MOD A from OT to prevent LOB. At discharge, pt will require 24/7 supervision and assist for ADL and mobility.       If plan is discharge home, recommend the following:  Supervision due to cognitive status;A lot of help with bathing/dressing/bathroom;Direct supervision/assist for medications management;Direct supervision/assist for financial management;Assist for transportation;Assistance with cooking/housework;Help with stairs or ramp for entrance;Assistance with feeding;A lot of help with walking and/or transfers   Equipment Recommendations  Wheelchair (measurements OT);Wheelchair cushion (measurements OT)       Precautions / Restrictions Precautions Precautions: Fall Recall of Precautions/Restrictions: Impaired Precaution/Restrictions Comments: dementia Restrictions Weight Bearing Restrictions Per Provider Order: No       Mobility Bed Mobility                General bed mobility comments: NT, in recliner pre and post session    Transfers Overall transfer level: Needs assistance Equipment used: 1 person hand held assist Transfers: Sit to/from Stand Sit to Stand: Mod assist           General transfer comment: Pt difficulties with motor planning due to cognition vs physical ability. ~4 mins to attempt STS from recliner, pt whistling and making hand gestures, does not understand directions or visual demonstration. once pt motivated enough, he is able to stand with CGA but has difficulties following through, requiring MOD to transition fully to standing     Balance Overall balance assessment: Needs assistance Sitting-balance support: Feet supported, No upper extremity supported Sitting balance-Leahy Scale: Fair   Postural control: Posterior lean Standing balance support: Single extremity supported, During functional activity Standing balance-Leahy Scale: Fair Standing balance comment: pt stands at sink with OT attempting oral care unsuccessfully                           ADL either performed or assessed with clinical judgement   ADL Overall ADL's : Needs assistance/impaired     Grooming: Oral care;Standing Grooming Details (indicate cue type and reason): oral care attempted; pt limited by cognition, does not reach for toothbrush, turns head away and begins whistling/gesturing with hands. internally and externally distracted even with visual demo                               General ADL Comments: Functional mobility 170 ft with HHA and CGA +1 for safety. Pt wanders, often difficult to direct due to decreased ability to comprehend. requires excessive time to follow all simple directions. pt easily startled with  carts coming quickly around corner.     Communication Communication Communication: Impaired Factors Affecting Communication: Difficulty expressing self   Cognition Arousal: Alert Behavior  During Therapy: WFL for tasks assessed/performed Cognition: History of cognitive impairments   Orientation impairments: Place, Time, Situation Awareness: Intellectual awareness impaired, Online awareness impaired Memory impairment (select all impairments): Short-term memory, Working [employer] fast streamer, Non-declarative long-term memory, Geneticist, molecular long-term memory Attention impairment (select first level of impairment): Focused attention Executive functioning impairment (select all impairments): Initiation, Organization, Sequencing, Reasoning, Problem solving OT - Cognition Comments: pleasant, difficulty following instructions                 Following commands: Impaired Following commands impaired: Follows one step commands inconsistently, Follows one step commands with increased time      Cueing   Cueing Techniques: Verbal cues, Gestural cues, Tactile cues, Visual cues        General Comments Requires increased time for all mobility and ADL efforts this date. Cooperative throughout.    Pertinent Vitals/ Pain       Pain Assessment Pain Assessment: PAINAD Breathing: normal Negative Vocalization: none Facial Expression: smiling or inexpressive Body Language: relaxed Consolability: no need to console PAINAD Score: 0   Frequency  Min 1X/week        Progress Toward Goals  OT Goals(current goals can now be found in the care plan section)  Progress towards OT goals: Progressing toward goals  Acute Rehab OT Goals OT Goal Formulation: Patient unable to participate in goal setting Time For Goal Achievement: 04/23/24 Potential to Achieve Goals: Fair  Plan         AM-PAC OT 6 Clicks Daily Activity     Outcome Measure   Help from another person eating meals?: A Little Help from another person taking care of personal grooming?: A Lot Help from another person toileting, which includes using toliet, bedpan, or urinal?: A Lot Help from another person bathing (including washing,  rinsing, drying)?: A Lot Help from another person to put on and taking off regular upper body clothing?: A Lot Help from another person to put on and taking off regular lower body clothing?: A Lot 6 Click Score: 13    End of Session Equipment Utilized During Treatment: Gait belt  OT Visit Diagnosis: Other abnormalities of gait and mobility (R26.89);Cognitive communication deficit (R41.841);Other symptoms and signs involving cognitive function Symptoms and signs involving cognitive functions: Other cerebrovascular disease   Activity Tolerance Patient tolerated treatment well   Patient Left with call bell/phone within reach;with bed alarm set;in chair   Nurse Communication Mobility status        Time: 8893-8867 OT Time Calculation (min): 26 min  Charges: OT General Charges $OT Visit: 1 Visit OT Treatments $Self Care/Home Management : 8-22 mins $Therapeutic Activity: 8-22 mins  Brissa Asante L. Tayna Smethurst, OTR/L  03/31/24, 12:40 PM

## 2024-03-31 NOTE — Plan of Care (Signed)

## 2024-03-31 NOTE — Progress Notes (Addendum)
 PROGRESS NOTE Seth Thompson    DOB: 09-Nov-1945, 78 y.o.  FMW:[id number]    Code Status: Limited: Do not attempt resuscitation (DNR) -DNR-LIMITED -Do Not Intubate/DNI    DOA: 02/11/2024   LOS: 49  Brief hospital course  Seth Thompson is a 78 y.o. male with medical history significant of essential hypertension, dementia, frequent PVCs, hyperlipidemia who was brought in from a facility after the patient lost consciousness. Tx pneumonia, also severe bradycardia likely cause for syncope, but not good candidate for pacer. Echo showed EF 60 to 65% with normal diastolic parameters.Telemetry unremarkable. Patient is currently stable for discharge pending safe dispo plan.  DSS guardian to be involved in dispo planning. TOC following for placement.   Assessment & Plan  Principal Problem:   Bradycardia with less than 30 beats per minute Active Problems:   Incarcerated right inguinal hernia   Syncope   Alzheimer's disease (HCC)  Acute metabolic encephalopathy on chronic cognitive impairment - now at baseline Severe dementia without behavioral disturbances. No behavior disturbances for well over a week, per report. He is calm and cooperative and agreeable to therapies. Sun-downing- no acute events seroquel  50 mg nightly   Syncope in the setting of bradycardia-improved. HR 50-80. Does not appear to be symptomatic with mobility  Avoid AV nodal blocking agents. Cardiologist advised no intervention of permanent pacemaker at this time.   Community-acquired pneumonia - resolved. ORA received Augmentin  for total 5 days. - CBC last checked 6/25 normal   Hypokalemia Replace as needed Monitor BMP periodically- remains stable. Last check 6/25 K+ 3.9   Incarcerated right sided inguinal hernia No surgical intervention recommended by surgery team. Continue as needed pain medication   Essential hypertension, cont losartan  increased amlodipine  to 10mg  daily   Hyperlipidemia No statin  Body mass  index is 25.02 kg/m.  VTE ppx: enoxaparin  (LOVENOX ) injection 40 mg Start: 02/11/24 2200  Diet:     Diet   Diet regular Room service appropriate? Yes; Fluid consistency: Thin   Consultants: Surgery Cardiology   Subjective 03/31/24    Seth Thompson reports no complaints. He appears to be in good spirits today.    Objective  Blood pressure (!) 147/65, pulse 66, temperature (!) 97.4 F (36.3 C), temperature source Oral, resp. rate 20, height 5' 10 (1.778 m), weight 79.1 kg, SpO2 100%.  Intake/Output Summary (Last 24 hours) at 03/31/2024 0715 Last data filed at 03/31/2024 0344 Gross per 24 hour  Intake 360 ml  Output 900 ml  Net -540 ml   Filed Weights   02/11/24 0633 02/17/24 0405 02/20/24 2100  Weight: 89.9 kg 78.3 kg 79.1 kg    Physical Exam:  General: awake, alert, NAD HEENT: atraumatic, clear conjunctiva, anicteric sclera, MMM, hearing grossly normal Respiratory: normal respiratory effort. Cardiovascular: quick capillary refill Nervous: Alert and unable to orient to self. no gross focal neurologic deficits, normal speech Extremities: moves all equally, no edema, normal tone Skin: dry, intact, normal temperature, normal color. No rashes, lesions or ulcers on exposed skin Psychiatry: pleasant mood  Labs   I have personally reviewed the following labs and imaging studies CBC    Component Value Date/Time   WBC 5.9 03/31/2024 0340   RBC 4.80 03/31/2024 0340   HGB 15.0 03/31/2024 0340   HGB 16.5 05/06/2013 1942   HCT 43.4 03/31/2024 0340   HCT 46.7 05/06/2013 1942   PLT 131 (L) 03/31/2024 0340   PLT 129 (L) 05/06/2013 1942   MCV 90.4 03/31/2024 0340   MCV 92  05/06/2013 1942   MCH 31.3 03/31/2024 0340   MCHC 34.6 03/31/2024 0340   RDW 12.8 03/31/2024 0340   RDW 13.2 05/06/2013 1942   LYMPHSABS 0.9 02/18/2024 0514   MONOABS 0.5 02/18/2024 0514   EOSABS 0.1 02/18/2024 0514   BASOSABS 0.0 02/18/2024 0514      Latest Ref Rng & Units 03/31/2024    3:40 AM 03/21/2024     5:13 AM 03/17/2024    4:51 AM  BMP  Glucose 70 - 99 mg/dL 879  891  886   BUN 8 - 23 mg/dL 26  25  27    Creatinine 0.61 - 1.24 mg/dL 9.05  8.98  9.12   Sodium 135 - 145 mmol/L 139  139  138   Potassium 3.5 - 5.1 mmol/L 3.9  4.0  3.9   Chloride 98 - 111 mmol/L 106  105  106   CO2 22 - 32 mmol/L 25  27  26    Calcium 8.9 - 10.3 mg/dL 9.1  9.1  9.0    No results found.  Disposition Plan & Communication  Patient status: Inpatient  Admitted From: Home Planned disposition location: Skilled nursing facility Anticipated discharge date: unknown pending placement  Family Communication: none at bedside    Author: Marien LITTIE Piety, DO [facility] 03/31/2024, 7:15 AM   Available by Epic secure chat 7AM-7PM. If 7PM-7AM, please contact night-coverage.  TRH contact information found on [email or web address].

## 2024-03-31 NOTE — Progress Notes (Signed)
 Mobility Specialist - Progress Note   03/31/24 1503  Mobility  Activity Ambulated with assistance in hallway  Level of Assistance Minimal assist, patient does 75% or more  Assistive Device None  Distance Ambulated (ft) 160 ft  Activity Response Tolerated well  Mobility visit 1 Mobility  Mobility Specialist Start Time (ACUTE ONLY) 1439  Mobility Specialist Stop Time (ACUTE ONLY) 1457  Mobility Specialist Time Calculation (min) (ACUTE ONLY) 18 min   Pt sitting in the recliner upon entry, utilizing RA. Pt STS MinA, stood at bedside while MS completed peri care. Pt amb one lap around the NS HHA of RUE, tolerated well--- min task initiation and sequencing during session. Pt returned to the room, left supine with alarm set and needs within reach.  America Silvan Mobility Specialist 03/31/24 3:17 PM

## 2024-03-31 NOTE — Progress Notes (Signed)
 Physical Therapy Treatment Patient Details Name: Seth Thompson MRN: [id number] DOB: May 26, 1946 Today's Date: 03/31/2024   History of Present Illness Pt is a 78 year old male admitted after LOC in shower, admitted with acute metabolic encephalopathy, syncope in the setting of bradycardia, hypokalemia, incarcerated right sided inguinal hernia. PMH significant for essential hypertension, dementia, frequent PVCs, and hyperlipidemia.    PT Comments  Pt supine in bed with HOB elevated upon entry, was alert and agreeable to session. During session pt was able to perform bed mobility with Mod A (mostly for movement initiation and to square hips), was able to perform a STS from EOB with Mod A (constant vc's, tactile cues, and demonstrative cues to initiate movement), and was able to ambulate 190 ft with 1 hand hold assist. In terms of gait quality, pt does present with intermittent forward progression which inconsistently get better with vc's and or visual markers to strive for. Pt continues to have increased difficulty with a busy hallway and with unexpected surfaces in the hallway (bed in the hallway), prompting for PT redirection. Due to yesterday's mobility note, PT trialed a RW for the last 55 ft of ambulation; with RW use, pt's gait speed increased per visual inspection, and made improved in gross gait mechanics. Pt continues to benefit from increased mobility throughout his stay, helping facilitate a routine and create a pattern to assist with motor planning. Pt will benefit from skilled therapeutic interventions to return to PLOF.     If plan is discharge home, recommend the following: Assistance with cooking/housework;Assistance with feeding;Help with stairs or ramp for entrance;A lot of help with bathing/dressing/bathroom;Supervision due to cognitive status;A little help with walking and/or transfers   Can travel by private vehicle     Yes  Equipment Recommendations  None recommended by PT     Recommendations for Other Services       Precautions / Restrictions Precautions Precautions: Fall Recall of Precautions/Restrictions: Impaired Precaution/Restrictions Comments: dementia Restrictions Weight Bearing Restrictions Per Provider Order: No     Mobility  Bed Mobility Overal bed mobility: Needs Assistance Bed Mobility: Supine to Sit     Supine to sit: Mod assist (to square hips to EOB)     General bed mobility comments: VC's for LE placement and movement initiation    Transfers Overall transfer level: Needs assistance Equipment used: 1 person hand held assist Transfers: Sit to/from Stand Sit to Stand: Mod assist           General transfer comment: Pt continues to present with initiation difficulties which is limited by cognitive impairment and inability to fully grasp vc's. Once engaged/stimulated, pt will perform the task at hand.    Ambulation/Gait Ambulation/Gait assistance: Min assist Gait Distance (Feet): 190 Feet Assistive device: 1 person hand held assist, Rolling walker (2 wheels) Gait Pattern/deviations: Step-through pattern, Decreased step length - right, Decreased step length - left, Decreased dorsiflexion - right, Decreased dorsiflexion - left Gait velocity: decreased     General Gait Details: Pt with intermittent forward progression with gait, constant vc's provided and visual markers to strive for. Pt does become eaily distracted with noises and or surfaces in the hallway, prompting for redirection. Was able to ambulate 135 ft with 1x hand hold assist and 55 ft with RW.        Balance Overall balance assessment: Needs assistance Sitting-balance support: No upper extremity supported, Bilateral upper extremity supported Sitting balance-Leahy Scale: Good Sitting balance - Comments: Steady static seated balance within BOS  Standing balance support: Single extremity supported, During functional activity Standing balance-Leahy Scale:  Good Standing balance comment: Steady dynamic balance without an overt LOB with fwd progression.   Communication Communication Communication: Impaired Factors Affecting Communication: Difficulty expressing self  Cognition Arousal: Alert Behavior During Therapy: WFL for tasks assessed/performed   PT - Cognitive impairments: History of cognitive impairments   PT - Cognition Comments: ALF staff present to assess progress during session Following commands: Impaired Following commands impaired: Follows one step commands inconsistently, Follows one step commands with increased time    Cueing Cueing Techniques: Verbal cues, Gestural cues, Tactile cues, Visual cues     General Comments General comments (skin integrity, edema, etc.): Pre session HR:56 bpm, SpO2: 97% on room air, Post session HR:58 bpm, SpO2: 97% on room air, very cooperative throughout      Pertinent Vitals/Pain Pain Assessment Pain Assessment: PAINAD Breathing: normal Negative Vocalization: none Facial Expression: smiling or inexpressive Body Language: relaxed Consolability: no need to console PAINAD Score: 0 Pain Intervention(s): Monitored during session, Limited activity within patient's tolerance     PT Goals (current goals can now be found in the care plan section) Acute Rehab PT Goals PT Goal Formulation: Patient unable to participate in goal setting Time For Goal Achievement: 03/29/24 Potential to Achieve Goals: Fair Progress towards PT goals: Progressing toward goals    Frequency    Min 1X/week       AM-PAC PT 6 Clicks Mobility   Outcome Measure  Help needed turning from your back to your side while in a flat bed without using bedrails?: A Lot Help needed moving from lying on your back to sitting on the side of a flat bed without using bedrails?: A Lot Help needed moving to and from a bed to a chair (including a wheelchair)?: A Little Help needed standing up from a chair using your arms (e.g.,  wheelchair or bedside chair)?: A Little Help needed to walk in hospital room?: A Little Help needed climbing 3-5 steps with a railing? : A Lot 6 Click Score: 15    End of Session Equipment Utilized During Treatment: Gait belt Activity Tolerance: Patient tolerated treatment well (limited by cognition) Patient left: in chair;with chair alarm set;with call bell/phone within reach Nurse Communication: Mobility status PT Visit Diagnosis: Unsteadiness on feet (R26.81);Difficulty in walking, not elsewhere classified (R26.2);Muscle weakness (generalized) (M62.81)     Time: 9180-9158 PT Time Calculation (min) (ACUTE ONLY): 22 min  Charges:                           Lynsie Mcwatters, SPT 03/31/24, 12:12 PM

## 2024-04-01 DIAGNOSIS — R55 Syncope and collapse: Secondary | ICD-10-CM | POA: Diagnosis not present

## 2024-04-01 DIAGNOSIS — G309 Alzheimer's disease, unspecified: Secondary | ICD-10-CM | POA: Diagnosis not present

## 2024-04-01 DIAGNOSIS — F0280 Dementia in other diseases classified elsewhere without behavioral disturbance: Secondary | ICD-10-CM | POA: Diagnosis not present

## 2024-04-01 DIAGNOSIS — R001 Bradycardia, unspecified: Secondary | ICD-10-CM | POA: Diagnosis not present

## 2024-04-01 NOTE — Progress Notes (Signed)
 PROGRESS NOTE Seth Thompson    DOB: 26-Apr-1946, 78 y.o.  FMW:[id number]    Code Status: Limited: Do not attempt resuscitation (DNR) -DNR-LIMITED -Do Not Intubate/DNI    DOA: 02/11/2024   LOS: 50  Brief hospital course  Seth Thompson is a 78 y.o. male with medical history significant of essential hypertension, dementia, frequent PVCs, hyperlipidemia who was brought in from a facility after the patient lost consciousness. Tx pneumonia, also severe bradycardia likely cause for syncope, but not good candidate for pacer. Echo showed EF 60 to 65% with normal diastolic parameters.Telemetry unremarkable. Patient is currently stable for discharge pending safe dispo plan.  DSS guardian to be involved in dispo planning. TOC following for placement.   Assessment & Plan  Principal Problem:   Bradycardia with less than 30 beats per minute Active Problems:   Incarcerated right inguinal hernia   Syncope   Alzheimer's disease (HCC)  Acute metabolic encephalopathy on chronic cognitive impairment - now at baseline Severe dementia without behavioral disturbances. No behavior disturbances for well over a week, per report. He is calm and cooperative and agreeable to therapies. Sun-downing- no acute events seroquel  50 mg nightly   Syncope in the setting of bradycardia-improved. HR 50-80. Does not appear to be symptomatic with mobility  Avoid AV nodal blocking agents. Cardiologist advised no intervention of permanent pacemaker at this time.   Community-acquired pneumonia - resolved. ORA received Augmentin  for total 5 days. - CBC last checked 6/25 normal   Hypokalemia Replace as needed Monitor BMP periodically- remains stable. Last check 6/25 K+ 3.9   Incarcerated right sided inguinal hernia No surgical intervention recommended by surgery team. Continue as needed pain medication   Essential hypertension, cont losartan  increased amlodipine  to 10mg  daily   Hyperlipidemia No statin  Body mass  index is 25.02 kg/m.  VTE ppx: enoxaparin  (LOVENOX ) injection 40 mg Start: 02/11/24 2200  Diet:     Diet   Diet regular Room service appropriate? Yes; Fluid consistency: Thin   Consultants: Surgery Cardiology   Subjective 04/01/24    Pt reports no complaints. He appears to be in good spirits today. Eating breakfast. Says,yummy, yummy, yummy.   Objective  Blood pressure (!) 147/65, pulse 66, temperature (!) 97.4 F (36.3 C), temperature source Oral, resp. rate 20, height 5' 10 (1.778 m), weight 79.1 kg, SpO2 100%.  Intake/Output Summary (Last 24 hours) at 04/01/2024 0711 Last data filed at 03/31/2024 1933 Gross per 24 hour  Intake 720 ml  Output 700 ml  Net 20 ml   Filed Weights   02/11/24 0633 02/17/24 0405 02/20/24 2100  Weight: 89.9 kg 78.3 kg 79.1 kg    Physical Exam:  General: awake, alert, NAD HEENT: atraumatic, clear conjunctiva, anicteric sclera, MMM, hearing grossly normal Respiratory: normal respiratory effort. Cardiovascular: quick capillary refill Nervous: Alert and unable to orient to self. no gross focal neurologic deficits, normal speech Extremities: moves all equally, no edema, normal tone Skin: dry, intact, normal temperature, normal color. No rashes, lesions or ulcers on exposed skin Psychiatry: pleasant mood  Labs   I have personally reviewed the following labs and imaging studies CBC    Component Value Date/Time   WBC 5.9 03/31/2024 0340   RBC 4.80 03/31/2024 0340   HGB 15.0 03/31/2024 0340   HGB 16.5 05/06/2013 1942   HCT 43.4 03/31/2024 0340   HCT 46.7 05/06/2013 1942   PLT 131 (L) 03/31/2024 0340   PLT 129 (L) 05/06/2013 1942   MCV 90.4 03/31/2024 0340  MCV 92 05/06/2013 1942   MCH 31.3 03/31/2024 0340   MCHC 34.6 03/31/2024 0340   RDW 12.8 03/31/2024 0340   RDW 13.2 05/06/2013 1942   LYMPHSABS 0.9 02/18/2024 0514   MONOABS 0.5 02/18/2024 0514   EOSABS 0.1 02/18/2024 0514   BASOSABS 0.0 02/18/2024 0514      Latest Ref Rng &  Units 03/31/2024    3:40 AM 03/21/2024    5:13 AM 03/17/2024    4:51 AM  BMP  Glucose 70 - 99 mg/dL 879  891  886   BUN 8 - 23 mg/dL 26  25  27    Creatinine 0.61 - 1.24 mg/dL 9.05  8.98  9.12   Sodium 135 - 145 mmol/L 139  139  138   Potassium 3.5 - 5.1 mmol/L 3.9  4.0  3.9   Chloride 98 - 111 mmol/L 106  105  106   CO2 22 - 32 mmol/L 25  27  26    Calcium 8.9 - 10.3 mg/dL 9.1  9.1  9.0    No results found.  Disposition Plan & Communication  Patient status: Inpatient  Admitted From: Home Planned disposition location: Skilled nursing facility Anticipated discharge date: unknown pending placement  Family Communication: none at bedside    Author: Marien LITTIE Piety, DO [facility] 04/01/2024, 7:11 AM   Available by Epic secure chat 7AM-7PM. If 7PM-7AM, please contact night-coverage.  TR[facility] contact information found on [email or web address].

## 2024-04-01 NOTE — TOC Progression Note (Addendum)
 Transition of Care [facility]) - Progression Note    Patient Details  Name: Seth Thompson MRN: [id number] Date of Birth: 1946-06-23  [facility]) CM/SW Contact  Lauraine JAYSON Carpen, LCSW Phone Number: 04/01/2024, 11:11 AM  Clinical Narrative:  [facility] is waiting to see if payment will be there today.   1:16 pm: [facility] is waiting on call back from the attorney to pick up the check.  3:07 pm: Check will be written today and picked up by SNF staff. Will plan on discharge tomorrow.  Expected Discharge Plan: Skilled Nursing Facility Barriers to Discharge: Continued Medical Work up  Expected Discharge Plan and Services       Living arrangements for the past 2 months: Assisted Living Facility                                       Social Determinants of Health (SDOH) Interventions SDOH Screenings   Food Insecurity: No Food Insecurity (02/11/2024)  Housing: Low Risk  (02/11/2024)  Transportation Needs: No Transportation Needs (02/11/2024)  Utilities: Not At Risk (02/11/2024)  Social Connections: Socially Isolated (02/11/2024)  Tobacco Use: Low Risk  (02/12/2024)    Readmission Risk Interventions     No data to display

## 2024-04-01 NOTE — Progress Notes (Signed)
 Mobility Specialist - Progress Note   04/01/24 0957  Mobility  Activity Ambulated with assistance in hallway  Level of Assistance Contact guard assist, steadying assist  Assistive Device None (HHA)  Activity Response Tolerated well  Mobility visit 1 Mobility  Mobility Specialist Start Time (ACUTE ONLY) 0935  Mobility Specialist Stop Time (ACUTE ONLY) 0954  Mobility Specialist Time Calculation (min) (ACUTE ONLY) 19 min   Pt semi fowler upon entry, utilizing RA. Pt agreeable to OOB amb this date. Pt completed bed mob ModA, STS from EOB MinA and amb one lap around the NS CGA HHA of the RUE-- tolerated well. Pt returned to the room, able to indep sit in the recliner with min positional cueing. Pt left seated in the recliner with alarm set and needs within reach. RN notified.  America Silvan Mobility Specialist 04/01/24 10:00 AM

## 2024-04-01 NOTE — NC FL2 (Incomplete)
 [place]  MEDICAID FL2 LEVEL OF CARE FORM     IDENTIFICATION  Patient Name: Seth Thompson Birthdate: 21-Jul-1946 Sex: male Admission Date (Current Location): 02/11/2024  [facility] and [place] Number:  Chiropodist and Address:         Provider Number: [id number]  Attending Physician Name and Address:  Lenon Marien LITTIE, MD  Relative Name and Phone Number:  [facility]  [phone] (Work Phone)    Current Level of Care: Hospital Recommended Level of Care: Skilled Nursing Facility Prior Approval Number:    Date Approved/Denied:   PASRR Number: [id number]  Discharge Plan:      Current Diagnoses: Patient Active Problem List   Diagnosis Date Noted   Bradycardia with less than 30 beats per minute 02/11/2024   Incarcerated right inguinal hernia 02/11/2024   Syncope 02/11/2024   Alzheimer's disease (HCC) 02/11/2024   Right inguinal hernia 04/01/2023   Diarrhea 02/02/2019   Benign essential HTN 02/03/2018   Mild dementia (HCC) 11/25/2017   Mild cognitive impairment 05/27/2017   Loss of memory 02/04/2017   Frequent PVCs 07/02/2016   Palpitations 06/27/2016   Bradycardia 02/22/2016   Mixed hyperlipidemia 05/10/2014    Orientation RESPIRATION BLADDER Height & Weight     Self  Normal Incontinent Weight: 174 lb 6.1 oz (79.1 kg) Height:  5' 10 (177.8 cm)  BEHAVIORAL SYMPTOMS/MOOD NEUROLOGICAL BOWEL NUTRITION STATUS      Incontinent Diet (heart)  AMBULATORY STATUS COMMUNICATION OF NEEDS Skin   Extensive Assist Verbally Skin abrasions, Bruising                       Personal Care Assistance Level of Assistance  Bathing, Feeding, Dressing Bathing Assistance: Maximum assistance Feeding assistance: Maximum assistance Dressing Assistance: Maximum assistance     Functional Limitations Info             SPECIAL CARE FACTORS FREQUENCY  PT (By licensed PT), OT (By licensed OT)     PT Frequency: 5 times per week OT  Frequency: 5 times per week            Contractures      Additional Factors Info  Code Status, Allergies Code Status Info: full Allergies Info: Codeine           Current Medications (04/01/2024):  This is the current hospital active medication list Current Facility-Administered Medications  Medication Dose Route Frequency Provider Last Rate Last Admin   amLODipine  (NORVASC ) tablet 10 mg  10 mg Oral Daily Lenon Marien LITTIE, MD   10 mg at 04/01/24 9062   enoxaparin  (LOVENOX ) injection 40 mg  40 mg Subcutaneous Q24H Dorinda Homans T, MD   40 mg at 03/31/24 2001   feeding supplement (ENSURE ENLIVE / ENSURE PLUS) liquid 237 mL  237 mL Oral BID BM Fausto Sor A, DO   237 mL at 04/01/24 1502   guaiFENesin -dextromethorphan (ROBITUSSIN DM) 100-10 MG/5ML syrup 5 mL  5 mL Oral Q4H PRN Dorinda Homans DASEN, MD       lactulose  (CHRONULAC ) 10 GM/15ML solution 30 g  30 g Oral BID PRN Djan, Prince T, MD   30 g at 02/17/24 1640   losartan  (COZAAR ) tablet 25 mg  25 mg Oral Daily Fausto Sor A, DO   25 mg at 04/01/24 0937   melatonin tablet 5 mg  5 mg Oral QHS Djan, Prince T, MD   5 mg at 03/31/24 2001   polyethylene glycol (MIRALAX  / GLYCOLAX ) packet  17 g  17 g Oral Daily PRN Dorinda Drue DASEN, MD       QUEtiapine  (SEROQUEL  XR) 24 hr tablet 50 mg  50 mg Oral QHS Awanda [place], MD   50 mg at 03/31/24 2001   senna (SENOKOT) tablet 8.6 mg  1 tablet Oral Once per day on Monday Wednesday Friday Dorinda Drue T, MD   8.6 mg at 03/31/24 9041   sertraline  (ZOLOFT ) tablet 50 mg  50 mg Oral QHS Dorinda Drue DASEN, MD   50 mg at 03/31/24 2001     Discharge Medications: Please see discharge summary for a list of discharge medications.  Relevant Imaging Results:  Relevant Lab Results:   Additional Information SS #: [id number]  Edsel DELENA Fischer, LCSW

## 2024-04-02 DIAGNOSIS — F01C0 Vascular dementia, severe, without behavioral disturbance, psychotic disturbance, mood disturbance, and anxiety: Secondary | ICD-10-CM

## 2024-04-02 DIAGNOSIS — M6281 Muscle weakness (generalized): Secondary | ICD-10-CM | POA: Diagnosis not present

## 2024-04-02 DIAGNOSIS — I10 Essential (primary) hypertension: Secondary | ICD-10-CM | POA: Diagnosis not present

## 2024-04-02 DIAGNOSIS — F0280 Dementia in other diseases classified elsewhere without behavioral disturbance: Secondary | ICD-10-CM | POA: Diagnosis not present

## 2024-04-02 DIAGNOSIS — R001 Bradycardia, unspecified: Secondary | ICD-10-CM | POA: Diagnosis not present

## 2024-04-02 MED ORDER — LOSARTAN POTASSIUM 25 MG PO TABS
25.0000 mg | ORAL_TABLET | Freq: Every day | ORAL | Status: AC
Start: 2024-04-02 — End: ?

## 2024-04-02 MED ORDER — AMLODIPINE BESYLATE 10 MG PO TABS: 10.0000 mg | ORAL_TABLET | Freq: Every day | ORAL | Status: AC

## 2024-04-02 MED ORDER — QUETIAPINE FUMARATE ER 50 MG PO TB24
50.0000 mg | ORAL_TABLET | Freq: Every day | ORAL | Status: AC
Start: 2024-04-02 — End: ?

## 2024-04-02 MED ORDER — ENSURE ENLIVE PO LIQD: 237.0000 mL | Freq: Two times a day (BID) | ORAL | Status: AC

## 2024-04-02 NOTE — TOC Transition Note (Signed)
 Transition of Care [facility]) - Discharge Note   Patient Details  Name: Seth Thompson MRN: [id number] Date of Birth: 03-May-1946  Transition of Care [facility]) CM/SW Contact:  Seth JAYSON Carpen, LCSW Phone Number: 04/02/2024, 11:40 AM   Clinical Narrative:   Patient has orders to discharge to [facility] today. RN will call report to [phone] (Room 712). [facility] has been arranged for around 1:00. No further concerns. CSW signing off.  Final next level of care: Skilled Nursing Facility Barriers to Discharge: Barriers Resolved   Patient Goals and CMS Choice   CMS Medicare.gov Compare Post Acute Care list provided to:: Patient Represenative (must comment) Choice offered to / list presented to : [facility] POA / Guardian      Discharge Placement PASRR number recieved: 02/14/24            Patient chooses bed at: [facility] Patient to be transferred to facility by: [facility] Name of family member notified: Lonell Byes Patient and family notified of of transfer: 04/02/24  Discharge Plan and Services Additional resources added to the After Visit Summary for                                       Social Drivers of Health (SDOH) Interventions SDOH Screenings   Food Insecurity: No Food Insecurity (02/11/2024)  Housing: Low Risk  (02/11/2024)  Transportation Needs: No Transportation Needs (02/11/2024)  Utilities: Not At Risk (02/11/2024)  Social Connections: Socially Isolated (02/11/2024)  Tobacco Use: Low Risk  (02/12/2024)     Readmission Risk Interventions     No data to display

## 2024-04-02 NOTE — Progress Notes (Signed)
 Mobility Specialist - Progress Note   04/02/24 1146  Mobility  Activity Ambulated with assistance in hallway;Ambulated with assistance in room  Level of Assistance Contact guard assist, steadying assist  Assistive Device None (HHA)  Activity Response Tolerated well  Mobility visit 1 Mobility  Mobility Specialist Start Time (ACUTE ONLY) 1105  Mobility Specialist Stop Time (ACUTE ONLY) 1124  Mobility Specialist Time Calculation (min) (ACUTE ONLY) 19 min   Pt semi fowler upon entry, utilizing RA. Pt agreeable to OOB amb this date. Pt completed bed mob ModA to bring trunk from sup to sit and bring BLE EOB. Pt STS from EOB MinA, amb one lap around the NS HHA of the RUE--- one LOB when turning to point at object during amb, CGA for correction. Pt returned to the room, left seated in the recliner with alarm set and needs within.  America Silvan Mobility Specialist 04/02/24 11:53 AM

## 2024-04-02 NOTE — Progress Notes (Incomplete)
 PROGRESS NOTE Seth Thompson    DOB: [birth year], 78 y.o.  FMW:[id number]    Code Status: Limited: Do not attempt resuscitation (DNR) -DNR-LIMITED -Do Not Intubate/DNI    DOA: 02/11/2024   LOS: 51  Brief hospital course  Seth Thompson is a 78 y.o. male with medical history significant of essential hypertension, dementia, frequent PVCs, hyperlipidemia who was brought in from a facility after the patient lost consciousness. Tx pneumonia, also severe bradycardia likely cause for syncope, but not good candidate for pacer. Echo showed EF 60 to 65% with normal diastolic parameters.Telemetry unremarkable. Patient is currently stable for discharge pending safe dispo plan.  DSS guardian to be involved in dispo planning. TOC following for placement.   Assessment & Plan  Principal Problem:   Bradycardia with less than 30 beats per minute Active Problems:   Incarcerated right inguinal hernia   Syncope   Alzheimer's disease (HCC)  Acute metabolic encephalopathy on chronic cognitive impairment - now at baseline Severe dementia without behavioral disturbances. No behavior disturbances for well over a week, per report. He is calm and cooperative and agreeable to therapies. Sun-downing- no acute events seroquel  50 mg nightly   Syncope in the setting of bradycardia-improved. HR 50-80. Does not appear to be symptomatic with mobility  Avoid AV nodal blocking agents. Cardiologist advised no intervention of permanent pacemaker at this time.   Community-acquired pneumonia - resolved. ORA received Augmentin  for total 5 days. - CBC last checked 6/25 normal   Hypokalemia Replace as needed Monitor BMP periodically- remains stable. Last check 6/25 K+ 3.9   Incarcerated right sided inguinal hernia No surgical intervention recommended by surgery team. Continue as needed pain medication   Essential hypertension, cont losartan  increased amlodipine  to 10mg  daily   Hyperlipidemia No statin  Body mass  index is 25.02 kg/m.  VTE ppx: enoxaparin  (LOVENOX ) injection 40 mg Start: 02/11/24 2200  Diet:     Diet   Diet regular Room service appropriate? Yes; Fluid consistency: Thin   Consultants: Surgery Cardiology   Subjective 04/02/24    Pt reports no complaints. He appears to be in good spirits today. Eating breakfast. Says,yummy, yummy, yummy.   Objective  Blood pressure (!) 147/65, pulse 66, temperature (!) 97.4 F (36.3 C), temperature source Oral, resp. rate 20, height 5' 10 (1.778 m), weight 79.1 kg, SpO2 100%.  Intake/Output Summary (Last 24 hours) at 04/02/2024 0726 Last data filed at 04/01/2024 2218 Gross per 24 hour  Intake 600 ml  Output 300 ml  Net 300 ml   Filed Weights   02/11/24 0633 02/17/24 0405 02/20/24 2100  Weight: 89.9 kg 78.3 kg 79.1 kg    Physical Exam:  General: awake, alert, NAD HEENT: atraumatic, clear conjunctiva, anicteric sclera, MMM, hearing grossly normal Respiratory: normal respiratory effort. Cardiovascular: quick capillary refill Nervous: Alert and unable to orient to self. no gross focal neurologic deficits, normal speech Extremities: moves all equally, no edema, normal tone Skin: dry, intact, normal temperature, normal color. No rashes, lesions or ulcers on exposed skin Psychiatry: pleasant mood  Labs   I have personally reviewed the following labs and imaging studies CBC    Component Value Date/Time   WBC 5.9 03/31/2024 0340   RBC 4.80 03/31/2024 0340   HGB 15.0 03/31/2024 0340   HGB 16.5 05/06/2013 1942   HCT 43.4 03/31/2024 0340   HCT 46.7 05/06/2013 1942   PLT 131 (L) 03/31/2024 0340   PLT 129 (L) 05/06/2013 1942   MCV 90.4 03/31/2024 0340  MCV 92 05/06/2013 1942   MCH 31.3 03/31/2024 0340   MCHC 34.6 03/31/2024 0340   RDW 12.8 03/31/2024 0340   RDW 13.2 05/06/2013 1942   LYMPHSABS 0.9 02/18/2024 0514   MONOABS 0.5 02/18/2024 0514   EOSABS 0.1 02/18/2024 0514   BASOSABS 0.0 02/18/2024 0514      Latest Ref Rng &  Units 03/31/2024    3:40 AM 03/21/2024    5:13 AM 03/17/2024    4:51 AM  BMP  Glucose 70 - 99 mg/dL 879  891  886   BUN 8 - 23 mg/dL 26  25  27    Creatinine 0.61 - 1.24 mg/dL 9.05  8.98  9.12   Sodium 135 - 145 mmol/L 139  139  138   Potassium 3.5 - 5.1 mmol/L 3.9  4.0  3.9   Chloride 98 - 111 mmol/L 106  105  106   CO2 22 - 32 mmol/L 25  27  26    Calcium 8.9 - 10.3 mg/dL 9.1  9.1  9.0    No results found.  Disposition Plan & Communication  Patient status: Inpatient  Admitted From: Home Planned disposition location: Skilled nursing facility Anticipated discharge date: unknown pending placement  Family Communication: none at bedside    Author: Marien LITTIE Piety, DO [facility] 04/02/2024, 7:26 AM   Available by Epic secure chat 7AM-7PM. If 7PM-7AM, please contact night-coverage.  [facility] contact information found on [email or web address].

## 2024-04-02 NOTE — Discharge Summary (Signed)
 Physician Discharge Summary  Patient: Seth Thompson FMW:[id number] DOB: 21-Jul-1946   Code Status: Limited: Do not attempt resuscitation (DNR) -DNR-LIMITED -Do Not Intubate/DNI  Admit date: 02/11/2024 Discharge date: 04/02/2024 Disposition: Skilled nursing facility, PT, OT, nurse aid, and RN PCP: Cletus Glenn  Recommendations for Outpatient Follow-up:  Follow up with PCP within 1-2 weeks Regarding general Thompson follow up and preventative care Monitor BP and titrate medications as needed  Discharge Diagnoses:  Principal Problem:   Bradycardia with less than 30 beats per minute Active Problems:   Incarcerated right inguinal hernia   Syncope   Alzheimer's disease Seth Thompson)  Brief Thompson Course Summary: [name] is a 78 y.o. male with medical history significant of essential hypertension, dementia, frequent PVCs, hyperlipidemia who was brought in from a facility after the patient lost consciousness.  He completed tx for pneumonia and is stable on room air without respiratory complaints. Also severe bradycardia noted since admission- likely cause for syncope, but not good candidate for pacer, per cardiology consult. Echo showed EF 60 to 65% with normal diastolic parameters.Telemetry unremarkable.  His HR remained stable in bradycardia from 50-60s range and did not experience symptoms of syncope with mobility. Avoid AV nodal blocking agents.  Medications were modified for HTN control as listed below.  He has advanced dementia requiring frequent cues and assistance with ADLs. He is kind and cooperative without behavioral disturbances and was a joy to take care of.  He has a legal guardian through DSS who should be consulted for medical decisions.    All other chronic conditions were treated with home medications.    Discharge Condition: Good, improved Recommended discharge diet: Regular healthy diet, he will need assistance in setting up, cutting his food. Administer  medications in vehicle such as pudding, puree  Consultations: Cardiology   Procedures/Studies: None   Allergies as of 04/02/2024       Reactions   Codeine Other (See Comments)        Medication List     TAKE these medications    amLODipine  10 MG tablet Commonly known as: NORVASC  Take 1 tablet (10 mg total) by mouth daily.   cyanocobalamin 1000 MCG tablet Take 1,000 mcg by mouth daily.   feeding supplement Liqd Take 237 mLs by mouth 2 (two) times daily between meals.   guaifenesin  100 MG/5ML syrup Commonly known as: ROBITUSSIN Take 10 mLs by mouth every 4 (four) hours as needed for cough or congestion.   losartan  25 MG tablet Commonly known as: COZAAR  Take 1 tablet (25 mg total) by mouth daily.   melatonin 5 MG Tabs Take 5 mg by mouth at bedtime.   multivitamin capsule Take 1 capsule by mouth daily.   polyethylene glycol 17 g packet Commonly known as: MIRALAX  / GLYCOLAX  Take 17 g by mouth daily as needed for mild constipation.   QUEtiapine  50 MG Tb24 24 hr tablet Commonly known as: SEROQUEL  XR Take 1 tablet (50 mg total) by mouth at bedtime.   senna 8.6 MG Tabs tablet Commonly known as: SENOKOT Take 1 tablet by mouth 3 (three) times a week. Mondays, Wednesday, and Friday   sertraline  50 MG tablet Commonly known as: ZOLOFT  Take 50 mg by mouth at bedtime.   simethicone 125 MG chewable tablet Commonly known as: MYLICON Chew 125 mg by mouth every 6 (six) hours as needed (gas/burping).        Contact information for follow-up providers     Alluri, Krishna C, MD. Krista  in 1 week(s).   Specialty: Cardiology Contact information: [address] [place] [address] [phone]              Contact information for after-discharge care     Destination     [facility] .   Service: Skilled Nursing Contact information: [address] [place]  [address] [phone]                      Subjective   Pt unable to reliably describe how he is doing as he is not oriented but he appears happy and to be enjoying the landscape photos on his TV today. He attempts to communicate and can indicate needs/wants in a way with gestures and noises.   All questions and concerns were addressed at time of discharge.  Objective  Blood pressure (!) 140/98, pulse (!) 51, temperature (!) 97.5 F (36.4 C), temperature source Oral, resp. rate 16, height 5' 10 (1.778 m), weight 79.1 kg, SpO2 94%.   General: Pt is alert, awake, not in acute distress Cardiovascular: RRR, S1/S2 +, no rubs, no gallops Respiratory: CTA bilaterally, no wheezing, no rhonchi Abdominal: Soft, NT, ND, bowel sounds + Extremities: no edema, no cyanosis  The results of significant diagnostics from this hospitalization (including imaging, microbiology, ancillary and laboratory) are listed below for reference.   Imaging studies: No results found.  Labs: Basic Metabolic Panel: Recent Labs  Lab 03/31/24 0340  NA 139  K 3.9  CL 106  CO2 25  GLUCOSE 120*  BUN 26*  CREATININE 0.94  CALCIUM 9.1   CBC: Recent Labs  Lab 03/31/24 0340  WBC 5.9  HGB 15.0  HCT 43.4  MCV 90.4  PLT 131*   Microbiology: Results for orders placed or performed during the Thompson encounter of 02/11/24  MRSA Next Gen by PCR, Nasal     Status: None   Collection Time: 02/14/24  6:32 AM   Specimen: Nasal Mucosa; Nasal Swab  Result Value Ref Range Status   MRSA by PCR Next Gen NOT DETECTED NOT DETECTED Final    Comment: (NOTE) The GeneXpert MRSA Assay (FDA approved for NASAL specimens only), is one component of a comprehensive MRSA colonization surveillance program. It is not intended to diagnose MRSA infection nor to guide or monitor treatment for MRSA infections. Test performance is not FDA approved in patients less than 55 years old. Performed at [facility], [address], [place], [place] [address]     Time  coordinating discharge: Over 30 minutes  Marien LITTIE Piety, MD  [facility] 04/02/2024, 9:59 AM

## 2024-04-02 NOTE — Plan of Care (Signed)

## 2024-04-02 NOTE — Plan of Care (Signed)
 ?  Problem: Clinical Measurements: ?Goal: Will remain free from infection ?Outcome: Progressing ?  ?

## 2024-04-06 DIAGNOSIS — R001 Bradycardia, unspecified: Secondary | ICD-10-CM | POA: Diagnosis not present

## 2024-04-06 DIAGNOSIS — I10 Essential (primary) hypertension: Secondary | ICD-10-CM | POA: Diagnosis not present

## 2024-04-06 DIAGNOSIS — F0280 Dementia in other diseases classified elsewhere without behavioral disturbance: Secondary | ICD-10-CM | POA: Diagnosis not present

## 2024-04-07 DIAGNOSIS — R001 Bradycardia, unspecified: Secondary | ICD-10-CM | POA: Diagnosis not present

## 2024-04-07 DIAGNOSIS — K4090 Unilateral inguinal hernia, without obstruction or gangrene, not specified as recurrent: Secondary | ICD-10-CM | POA: Diagnosis not present

## 2024-04-08 DIAGNOSIS — R001 Bradycardia, unspecified: Secondary | ICD-10-CM | POA: Diagnosis not present

## 2024-04-13 DIAGNOSIS — I10 Essential (primary) hypertension: Secondary | ICD-10-CM | POA: Diagnosis not present

## 2024-04-13 DIAGNOSIS — R001 Bradycardia, unspecified: Secondary | ICD-10-CM | POA: Diagnosis not present

## 2024-04-14 DIAGNOSIS — W1839XA Other fall on same level, initial encounter: Secondary | ICD-10-CM | POA: Diagnosis not present

## 2024-04-14 DIAGNOSIS — M6259 Muscle wasting and atrophy, not elsewhere classified, multiple sites: Secondary | ICD-10-CM | POA: Diagnosis not present

## 2024-04-16 DIAGNOSIS — F0280 Dementia in other diseases classified elsewhere without behavioral disturbance: Secondary | ICD-10-CM | POA: Diagnosis not present

## 2024-04-16 DIAGNOSIS — I10 Essential (primary) hypertension: Secondary | ICD-10-CM | POA: Diagnosis not present

## 2024-04-16 DIAGNOSIS — N1832 Chronic kidney disease, stage 3b: Secondary | ICD-10-CM | POA: Diagnosis not present

## 2024-04-16 DIAGNOSIS — E119 Type 2 diabetes mellitus without complications: Secondary | ICD-10-CM | POA: Diagnosis not present

## 2024-04-19 DIAGNOSIS — I10 Essential (primary) hypertension: Secondary | ICD-10-CM | POA: Diagnosis not present

## 2024-04-19 DIAGNOSIS — G4700 Insomnia, unspecified: Secondary | ICD-10-CM | POA: Diagnosis not present

## 2024-04-20 DIAGNOSIS — R001 Bradycardia, unspecified: Secondary | ICD-10-CM | POA: Diagnosis not present

## 2024-04-20 DIAGNOSIS — F03C0 Unspecified dementia, severe, without behavioral disturbance, psychotic disturbance, mood disturbance, and anxiety: Secondary | ICD-10-CM | POA: Diagnosis not present

## 2024-04-20 DIAGNOSIS — R55 Syncope and collapse: Secondary | ICD-10-CM | POA: Diagnosis not present

## 2024-04-20 DIAGNOSIS — I10 Essential (primary) hypertension: Secondary | ICD-10-CM | POA: Diagnosis not present

## 2024-04-22 DIAGNOSIS — F0280 Dementia in other diseases classified elsewhere without behavioral disturbance: Secondary | ICD-10-CM | POA: Diagnosis not present

## 2024-04-22 DIAGNOSIS — I10 Essential (primary) hypertension: Secondary | ICD-10-CM | POA: Diagnosis not present

## 2024-04-26 DIAGNOSIS — I10 Essential (primary) hypertension: Secondary | ICD-10-CM | POA: Diagnosis not present

## 2024-04-26 DIAGNOSIS — M6259 Muscle wasting and atrophy, not elsewhere classified, multiple sites: Secondary | ICD-10-CM | POA: Diagnosis not present

## 2024-05-02 DIAGNOSIS — F411 Generalized anxiety disorder: Secondary | ICD-10-CM | POA: Diagnosis not present

## 2024-05-06 DIAGNOSIS — F331 Major depressive disorder, recurrent, moderate: Secondary | ICD-10-CM | POA: Diagnosis not present

## 2024-05-06 DIAGNOSIS — I10 Essential (primary) hypertension: Secondary | ICD-10-CM | POA: Diagnosis not present

## 2024-05-06 DIAGNOSIS — F0280 Dementia in other diseases classified elsewhere without behavioral disturbance: Secondary | ICD-10-CM | POA: Diagnosis not present

## 2024-05-06 DIAGNOSIS — F5101 Primary insomnia: Secondary | ICD-10-CM | POA: Diagnosis not present

## 2024-05-18 DIAGNOSIS — F02B18 Dementia in other diseases classified elsewhere, moderate, with other behavioral disturbance: Secondary | ICD-10-CM | POA: Diagnosis not present

## 2024-05-18 DIAGNOSIS — F331 Major depressive disorder, recurrent, moderate: Secondary | ICD-10-CM | POA: Diagnosis not present

## 2024-05-18 DIAGNOSIS — F5101 Primary insomnia: Secondary | ICD-10-CM | POA: Diagnosis not present

## 2024-05-19 DIAGNOSIS — F5101 Primary insomnia: Secondary | ICD-10-CM | POA: Diagnosis not present

## 2024-05-19 DIAGNOSIS — F02B18 Dementia in other diseases classified elsewhere, moderate, with other behavioral disturbance: Secondary | ICD-10-CM | POA: Diagnosis not present

## 2024-05-19 DIAGNOSIS — F331 Major depressive disorder, recurrent, moderate: Secondary | ICD-10-CM | POA: Diagnosis not present

## 2024-06-07 DIAGNOSIS — F5101 Primary insomnia: Secondary | ICD-10-CM | POA: Diagnosis not present

## 2024-06-07 DIAGNOSIS — I10 Essential (primary) hypertension: Secondary | ICD-10-CM | POA: Diagnosis not present

## 2024-06-07 DIAGNOSIS — F331 Major depressive disorder, recurrent, moderate: Secondary | ICD-10-CM | POA: Diagnosis not present

## 2024-06-07 DIAGNOSIS — F0280 Dementia in other diseases classified elsewhere without behavioral disturbance: Secondary | ICD-10-CM | POA: Diagnosis not present

## 2024-06-15 DIAGNOSIS — F331 Major depressive disorder, recurrent, moderate: Secondary | ICD-10-CM | POA: Diagnosis not present

## 2024-06-15 DIAGNOSIS — F02B18 Dementia in other diseases classified elsewhere, moderate, with other behavioral disturbance: Secondary | ICD-10-CM | POA: Diagnosis not present

## 2024-06-15 DIAGNOSIS — F5101 Primary insomnia: Secondary | ICD-10-CM | POA: Diagnosis not present

## 2024-07-01 DIAGNOSIS — F0280 Dementia in other diseases classified elsewhere without behavioral disturbance: Secondary | ICD-10-CM | POA: Diagnosis not present

## 2024-07-01 DIAGNOSIS — I10 Essential (primary) hypertension: Secondary | ICD-10-CM | POA: Diagnosis not present

## 2024-07-01 DIAGNOSIS — F5101 Primary insomnia: Secondary | ICD-10-CM | POA: Diagnosis not present

## 2024-07-01 DIAGNOSIS — F331 Major depressive disorder, recurrent, moderate: Secondary | ICD-10-CM | POA: Diagnosis not present

## 2024-07-05 DIAGNOSIS — F5101 Primary insomnia: Secondary | ICD-10-CM | POA: Diagnosis not present

## 2024-07-05 DIAGNOSIS — F02B18 Dementia in other diseases classified elsewhere, moderate, with other behavioral disturbance: Secondary | ICD-10-CM | POA: Diagnosis not present

## 2024-07-05 DIAGNOSIS — F331 Major depressive disorder, recurrent, moderate: Secondary | ICD-10-CM | POA: Diagnosis not present

## 2024-07-06 DIAGNOSIS — E568 Deficiency of other vitamins: Secondary | ICD-10-CM | POA: Diagnosis not present

## 2024-07-06 DIAGNOSIS — F3289 Other specified depressive episodes: Secondary | ICD-10-CM | POA: Diagnosis not present

## 2024-07-06 DIAGNOSIS — F01518 Vascular dementia, unspecified severity, with other behavioral disturbance: Secondary | ICD-10-CM | POA: Diagnosis not present

## 2024-07-06 DIAGNOSIS — F0280 Dementia in other diseases classified elsewhere without behavioral disturbance: Secondary | ICD-10-CM | POA: Diagnosis not present

## 2024-08-02 DIAGNOSIS — F331 Major depressive disorder, recurrent, moderate: Secondary | ICD-10-CM | POA: Diagnosis not present

## 2024-08-02 DIAGNOSIS — F5101 Primary insomnia: Secondary | ICD-10-CM | POA: Diagnosis not present

## 2024-08-02 DIAGNOSIS — F02B18 Dementia in other diseases classified elsewhere, moderate, with other behavioral disturbance: Secondary | ICD-10-CM | POA: Diagnosis not present

## 2024-08-25 DIAGNOSIS — E569 Vitamin deficiency, unspecified: Secondary | ICD-10-CM | POA: Diagnosis not present

## 2024-08-25 DIAGNOSIS — F331 Major depressive disorder, recurrent, moderate: Secondary | ICD-10-CM | POA: Diagnosis not present

## 2024-08-30 DIAGNOSIS — F331 Major depressive disorder, recurrent, moderate: Secondary | ICD-10-CM | POA: Diagnosis not present

## 2024-08-30 DIAGNOSIS — F02B18 Dementia in other diseases classified elsewhere, moderate, with other behavioral disturbance: Secondary | ICD-10-CM | POA: Diagnosis not present

## 2024-08-30 DIAGNOSIS — F5101 Primary insomnia: Secondary | ICD-10-CM | POA: Diagnosis not present

## 2024-09-02 DIAGNOSIS — I10 Essential (primary) hypertension: Secondary | ICD-10-CM | POA: Diagnosis not present

## 2024-09-02 DIAGNOSIS — D519 Vitamin B12 deficiency anemia, unspecified: Secondary | ICD-10-CM | POA: Diagnosis not present

## 2024-09-02 DIAGNOSIS — F330 Major depressive disorder, recurrent, mild: Secondary | ICD-10-CM | POA: Diagnosis not present

## 2024-09-02 DIAGNOSIS — F0280 Dementia in other diseases classified elsewhere without behavioral disturbance: Secondary | ICD-10-CM | POA: Diagnosis not present

## 2024-09-15 DIAGNOSIS — I10 Essential (primary) hypertension: Secondary | ICD-10-CM | POA: Diagnosis not present

## 2024-09-15 DIAGNOSIS — F0280 Dementia in other diseases classified elsewhere without behavioral disturbance: Secondary | ICD-10-CM | POA: Diagnosis not present

## 2024-09-15 DIAGNOSIS — F330 Major depressive disorder, recurrent, mild: Secondary | ICD-10-CM | POA: Diagnosis not present

## 2024-09-15 DIAGNOSIS — G4700 Insomnia, unspecified: Secondary | ICD-10-CM | POA: Diagnosis not present
